# Patient Record
Sex: Male | Born: 1970
Health system: Southern US, Community
[De-identification: ages and names within clinical notes are randomized; demographics above are authoritative.]

## PROBLEM LIST (undated history)

## (undated) DIAGNOSIS — E119 Type 2 diabetes mellitus without complications: Secondary | ICD-10-CM

---

## 2012-11-05 ENCOUNTER — Encounter (HOSPITAL_COMMUNITY): Payer: Self-pay | Admitting: *Deleted

## 2012-11-05 ENCOUNTER — Inpatient Hospital Stay (HOSPITAL_COMMUNITY)
Admission: EM | Admit: 2012-11-05 | Discharge: 2012-11-07 | DRG: 639 | Disposition: A | Discharge status: Home / Self Care | Payer: MEDICAID | Attending: Internal Medicine | Admitting: Internal Medicine

## 2012-11-05 DIAGNOSIS — E119 Type 2 diabetes mellitus without complications: Secondary | ICD-10-CM

## 2012-11-05 DIAGNOSIS — Z79899 Other long term (current) drug therapy: Secondary | ICD-10-CM

## 2012-11-05 DIAGNOSIS — E781 Pure hyperglyceridemia: Secondary | ICD-10-CM | POA: Diagnosis present

## 2012-11-05 DIAGNOSIS — Z87891 Personal history of nicotine dependence: Secondary | ICD-10-CM

## 2012-11-05 DIAGNOSIS — E876 Hypokalemia: Secondary | ICD-10-CM | POA: Diagnosis present

## 2012-11-05 DIAGNOSIS — E1010 Type 1 diabetes mellitus with ketoacidosis without coma: Principal | ICD-10-CM | POA: Diagnosis present

## 2012-11-05 DIAGNOSIS — E1110 Type 2 diabetes mellitus with ketoacidosis without coma: Secondary | ICD-10-CM

## 2012-11-05 LAB — URINE MICROSCOPIC-ADD ON

## 2012-11-05 LAB — URINALYSIS, ROUTINE W REFLEX MICROSCOPIC
Bilirubin Urine: NEGATIVE
Glucose, UA: >1000 mg/dL — AB
Hgb urine dipstick: NEGATIVE
Ketones, ur: 40 mg/dL — AB
Leukocytes, UA: NEGATIVE
Nitrite: NEGATIVE
Protein, ur: NEGATIVE mg/dL
Specific Gravity, Urine: 1.031 — ABNORMAL HIGH (ref 1.005–1.030)
Urobilinogen, UA: 0.2 mg/dL (ref 0.0–1.0)
pH: 5.5 (ref 5.0–8.0)

## 2012-11-05 LAB — GLUCOSE, CAPILLARY: Glucose-Capillary: 564 mg/dL (ref 70–99)

## 2012-11-05 MED ORDER — SODIUM CHLORIDE 0.9 % IV SOLN
INTRAVENOUS | Status: DC
  Administered 2012-11-06: via INTRAVENOUS

## 2012-11-05 MED ORDER — SODIUM CHLORIDE 0.9 % IV BOLUS (SEPSIS)
1000.0000 mL | Freq: Once | INTRAVENOUS | Status: AC
  Administered 2012-11-05: 1000 mL via INTRAVENOUS

## 2012-11-05 NOTE — ED Notes (Signed)
Also blurred vision and finger sore that isn't healing

## 2012-11-05 NOTE — ED Notes (Signed)
Pt states he hasn't been diagnosed with diabetes but it runs in his family and that today he was feeling really bad and his Aunt checked his sugar and it stated greater than 600,  He has had dry mouth frequent urination,  Very tired,

## 2012-11-05 NOTE — ED Notes (Signed)
CBG: 564

## 2012-11-06 ENCOUNTER — Encounter (HOSPITAL_COMMUNITY): Payer: Self-pay | Admitting: Internal Medicine

## 2012-11-06 DIAGNOSIS — E119 Type 2 diabetes mellitus without complications: Secondary | ICD-10-CM

## 2012-11-06 DIAGNOSIS — E1110 Type 2 diabetes mellitus with ketoacidosis without coma: Secondary | ICD-10-CM | POA: Diagnosis present

## 2012-11-06 DIAGNOSIS — E876 Hypokalemia: Secondary | ICD-10-CM | POA: Diagnosis present

## 2012-11-06 LAB — BASIC METABOLIC PANEL
BUN: 10 mg/dL (ref 6–23)
BUN: 8 mg/dL (ref 6–23)
BUN: 8 mg/dL (ref 6–23)
BUN: 7 mg/dL (ref 6–23)
CO2: 16 mEq/L — ABNORMAL LOW (ref 19–32)
CO2: 16 mEq/L — ABNORMAL LOW (ref 19–32)
CO2: 16 mEq/L — ABNORMAL LOW (ref 19–32)
CO2: 18 mEq/L — ABNORMAL LOW (ref 19–32)
Calcium: 8 mg/dL — ABNORMAL LOW (ref 8.4–10.5)
Calcium: 7.8 mg/dL — ABNORMAL LOW (ref 8.4–10.5)
Calcium: 7.6 mg/dL — ABNORMAL LOW (ref 8.4–10.5)
Calcium: 7.7 mg/dL — ABNORMAL LOW (ref 8.4–10.5)
Chloride: 103 mEq/L (ref 96–112)
Chloride: 103 mEq/L (ref 96–112)
Chloride: 107 mEq/L (ref 96–112)
Chloride: 109 mEq/L (ref 96–112)
Creatinine, Ser: 0.84 mg/dL (ref 0.50–1.35)
Creatinine, Ser: 0.75 mg/dL (ref 0.50–1.35)
Creatinine, Ser: 0.65 mg/dL (ref 0.50–1.35)
Creatinine, Ser: 0.61 mg/dL (ref 0.50–1.35)
GFR calc Af Amer: >90 mL/min (ref >90)
GFR calc Af Amer: >90 mL/min (ref >90)
GFR calc Af Amer: >90 mL/min (ref >90)
GFR calc Af Amer: >90 mL/min (ref >90)
GFR calc non Af Amer: >90 mL/min (ref >90)
GFR calc non Af Amer: >90 mL/min (ref >90)
GFR calc non Af Amer: >90 mL/min (ref >90)
GFR calc non Af Amer: >90 mL/min (ref >90)
Glucose, Bld: 247 mg/dL — ABNORMAL HIGH (ref 70–99)
Glucose, Bld: 227 mg/dL — ABNORMAL HIGH (ref 70–99)
Glucose, Bld: 168 mg/dL — ABNORMAL HIGH (ref 70–99)
Glucose, Bld: 138 mg/dL — ABNORMAL HIGH (ref 70–99)
Potassium: 3.5 mEq/L (ref 3.5–5.1)
Potassium: 3.3 mEq/L — ABNORMAL LOW (ref 3.5–5.1)
Potassium: 3.6 mEq/L (ref 3.5–5.1)
Potassium: 3.3 mEq/L — ABNORMAL LOW (ref 3.5–5.1)
Sodium: 136 mEq/L (ref 135–145)
Sodium: 133 mEq/L — ABNORMAL LOW (ref 135–145)
Sodium: 136 mEq/L (ref 135–145)
Sodium: 138 mEq/L (ref 135–145)

## 2012-11-06 LAB — GLUCOSE, CAPILLARY
Glucose-Capillary: 132 mg/dL — ABNORMAL HIGH (ref 70–99)
Glucose-Capillary: 136 mg/dL — ABNORMAL HIGH (ref 70–99)
Glucose-Capillary: 139 mg/dL — ABNORMAL HIGH (ref 70–99)
Glucose-Capillary: 151 mg/dL — ABNORMAL HIGH (ref 70–99)
Glucose-Capillary: 159 mg/dL — ABNORMAL HIGH (ref 70–99)
Glucose-Capillary: 163 mg/dL — ABNORMAL HIGH (ref 70–99)
Glucose-Capillary: 166 mg/dL — ABNORMAL HIGH (ref 70–99)
Glucose-Capillary: 168 mg/dL — ABNORMAL HIGH (ref 70–99)
Glucose-Capillary: 169 mg/dL — ABNORMAL HIGH (ref 70–99)
Glucose-Capillary: 184 mg/dL — ABNORMAL HIGH (ref 70–99)
Glucose-Capillary: 197 mg/dL — ABNORMAL HIGH (ref 70–99)
Glucose-Capillary: 202 mg/dL — ABNORMAL HIGH (ref 70–99)
Glucose-Capillary: 231 mg/dL — ABNORMAL HIGH (ref 70–99)
Glucose-Capillary: 264 mg/dL — ABNORMAL HIGH (ref 70–99)
Glucose-Capillary: 319 mg/dL — ABNORMAL HIGH (ref 70–99)
Glucose-Capillary: 368 mg/dL — ABNORMAL HIGH (ref 70–99)
Glucose-Capillary: 381 mg/dL — ABNORMAL HIGH (ref 70–99)

## 2012-11-06 LAB — MRSA PCR SCREENING: MRSA by PCR: NEGATIVE

## 2012-11-06 LAB — CBC
HCT: 39 % (ref 39.0–52.0)
HCT: 36.3 % — ABNORMAL LOW (ref 39.0–52.0)
Hemoglobin: 13.4 g/dL (ref 13.0–17.0)
Hemoglobin: 12.7 g/dL — ABNORMAL LOW (ref 13.0–17.0)
MCH: 29.6 pg (ref 26.0–34.0)
MCH: 30 pg (ref 26.0–34.0)
MCHC: 34.4 g/dL (ref 30.0–36.0)
MCHC: 35 g/dL (ref 30.0–36.0)
MCV: 86.1 fL (ref 78.0–100.0)
MCV: 85.8 fL (ref 78.0–100.0)
Platelets: 184 10*3/uL (ref 150–400)
Platelets: 190 10*3/uL (ref 150–400)
RBC: 4.53 MIL/uL (ref 4.22–5.81)
RBC: 4.23 MIL/uL (ref 4.22–5.81)
RDW: 13.8 % (ref 11.5–15.5)
RDW: 13.7 % (ref 11.5–15.5)
WBC: 6.2 10*3/uL (ref 4.0–10.5)
WBC: 6.8 10*3/uL (ref 4.0–10.5)

## 2012-11-06 LAB — CBC WITH DIFFERENTIAL/PLATELET
Basophils Absolute: 0 10*3/uL (ref 0.0–0.1)
Basophils Relative: 0 % (ref 0–1)
Eosinophils Absolute: 0 10*3/uL (ref 0.0–0.7)
Eosinophils Relative: 0 % (ref 0–5)
HCT: 42.7 % (ref 39.0–52.0)
Hemoglobin: 15.3 g/dL (ref 13.0–17.0)
Lymphocytes Relative: 35 % (ref 12–46)
Lymphs Abs: 2.4 10*3/uL (ref 0.7–4.0)
MCH: 30.7 pg (ref 26.0–34.0)
MCHC: 35.8 g/dL (ref 30.0–36.0)
MCV: 85.7 fL (ref 78.0–100.0)
Monocytes Absolute: 0.6 10*3/uL (ref 0.1–1.0)
Monocytes Relative: 9 % (ref 3–12)
Neutro Abs: 3.9 10*3/uL (ref 1.7–7.7)
Neutrophils Relative %: 56 % (ref 43–77)
Platelets: 214 10*3/uL (ref 150–400)
RBC: 4.98 MIL/uL (ref 4.22–5.81)
RDW: 13.8 % (ref 11.5–15.5)
WBC: 7 10*3/uL (ref 4.0–10.5)

## 2012-11-06 LAB — TSH: TSH: 1.941 u[IU]/mL (ref 0.350–4.500)

## 2012-11-06 LAB — COMPREHENSIVE METABOLIC PANEL
ALT: 21 U/L (ref 0–53)
AST: 19 U/L (ref 0–37)
Albumin: 3.8 g/dL (ref 3.5–5.2)
Alkaline Phosphatase: 99 U/L (ref 39–117)
BUN: 11 mg/dL (ref 6–23)
CO2: 19 mEq/L (ref 19–32)
Calcium: 8.8 mg/dL (ref 8.4–10.5)
Chloride: 93 mEq/L — ABNORMAL LOW (ref 96–112)
Creatinine, Ser: 0.81 mg/dL (ref 0.50–1.35)
GFR calc Af Amer: >90 mL/min (ref >90)
GFR calc non Af Amer: >90 mL/min (ref >90)
Glucose, Bld: 573 mg/dL (ref 70–99)
Potassium: 4.4 mEq/L (ref 3.5–5.1)
Sodium: 131 mEq/L — ABNORMAL LOW (ref 135–145)
Total Bilirubin: 0.3 mg/dL (ref 0.3–1.2)
Total Protein: 7.9 g/dL (ref 6.0–8.3)

## 2012-11-06 LAB — CREATININE, SERUM
Creatinine, Ser: 0.82 mg/dL (ref 0.50–1.35)
GFR calc Af Amer: >90 mL/min (ref >90)
GFR calc non Af Amer: >90 mL/min (ref >90)

## 2012-11-06 LAB — HEMOGLOBIN A1C
Hgb A1c MFr Bld: 13.8 % — ABNORMAL HIGH (ref <5.7)
Mean Plasma Glucose: 349 mg/dL — ABNORMAL HIGH (ref <117)

## 2012-11-06 LAB — TROPONIN I: Troponin I: <0.3 ng/mL (ref <0.30)

## 2012-11-06 LAB — LIPID PANEL
Cholesterol: 149 mg/dL (ref 0–200)
HDL: 25 mg/dL — ABNORMAL LOW (ref >39)
LDL Cholesterol: 57 mg/dL (ref 0–99)
Total CHOL/HDL Ratio: 6 RATIO
Triglycerides: 333 mg/dL — ABNORMAL HIGH (ref <150)
VLDL: 67 mg/dL — ABNORMAL HIGH (ref 0–40)

## 2012-11-06 MED ORDER — INSULIN ASPART 100 UNIT/ML ~~LOC~~ SOLN
0.0000 [IU] | Freq: Three times a day (TID) | SUBCUTANEOUS | Status: DC
  Administered 2012-11-06: 3 [IU] via SUBCUTANEOUS
  Administered 2012-11-07 (×2): 8 [IU] via SUBCUTANEOUS

## 2012-11-06 MED ORDER — POTASSIUM CHLORIDE CRYS ER 20 MEQ PO TBCR
40.0000 meq | EXTENDED_RELEASE_TABLET | Freq: Two times a day (BID) | ORAL | Status: AC
  Administered 2012-11-06 (×2): 40 meq via ORAL
  Filled 2012-11-06 (×2): qty 2

## 2012-11-06 MED ORDER — INSULIN ASPART 100 UNIT/ML ~~LOC~~ SOLN
3.0000 [IU] | Freq: Three times a day (TID) | SUBCUTANEOUS | Status: DC
  Administered 2012-11-06: 3 [IU] via SUBCUTANEOUS

## 2012-11-06 MED ORDER — DEXTROSE 50 % IV SOLN: 25.0000 mL | INTRAVENOUS | Status: DC | PRN

## 2012-11-06 MED ORDER — SODIUM CHLORIDE 0.9 % IV SOLN: INTRAVENOUS | Status: DC

## 2012-11-06 MED ORDER — SODIUM CHLORIDE 0.9 % IV SOLN
INTRAVENOUS | Status: DC
  Administered 2012-11-06 – 2012-11-07 (×2): via INTRAVENOUS

## 2012-11-06 MED ORDER — INSULIN REGULAR BOLUS VIA INFUSION
0.0000 [IU] | Freq: Three times a day (TID) | INTRAVENOUS | Status: DC
  Filled 2012-11-06: qty 10

## 2012-11-06 MED ORDER — POTASSIUM CHLORIDE 10 MEQ/100ML IV SOLN
10.0000 meq | INTRAVENOUS | Status: AC
  Administered 2012-11-06 (×4): 10 meq via INTRAVENOUS
  Filled 2012-11-06 (×4): qty 100

## 2012-11-06 MED ORDER — INSULIN REGULAR HUMAN 100 UNIT/ML IJ SOLN
INTRAMUSCULAR | Status: DC
  Administered 2012-11-06: 3.1 [IU]/h via INTRAVENOUS
  Filled 2012-11-06: qty 1

## 2012-11-06 MED ORDER — ONDANSETRON HCL 4 MG/2ML IJ SOLN: 4.0000 mg | Freq: Three times a day (TID) | INTRAMUSCULAR | Status: AC | PRN

## 2012-11-06 MED ORDER — DEXTROSE-NACL 5-0.45 % IV SOLN
INTRAVENOUS | Status: DC
  Administered 2012-11-06: 03:00:00 via INTRAVENOUS

## 2012-11-06 MED ORDER — DEXTROSE-NACL 5-0.45 % IV SOLN: INTRAVENOUS | Status: DC

## 2012-11-06 MED ORDER — INSULIN GLARGINE 100 UNIT/ML ~~LOC~~ SOLN
10.0000 [IU] | Freq: Once | SUBCUTANEOUS | Status: AC
  Administered 2012-11-06: 10 [IU] via SUBCUTANEOUS

## 2012-11-06 MED ORDER — INSULIN ASPART 100 UNIT/ML ~~LOC~~ SOLN
0.0000 [IU] | Freq: Every day | SUBCUTANEOUS | Status: DC
  Administered 2012-11-06: 3 [IU] via SUBCUTANEOUS

## 2012-11-06 MED ORDER — SODIUM CHLORIDE 0.9 % IV SOLN
INTRAVENOUS | Status: DC
  Administered 2012-11-06: 6.6 [IU]/h via INTRAVENOUS
  Administered 2012-11-06: 3 [IU]/h via INTRAVENOUS
  Administered 2012-11-06: 6.3 [IU]/h via INTRAVENOUS
  Filled 2012-11-06: qty 1

## 2012-11-06 MED ORDER — INSULIN GLARGINE 100 UNIT/ML ~~LOC~~ SOLN
10.0000 [IU] | Freq: Every day | SUBCUTANEOUS | Status: DC
  Administered 2012-11-06: 10 [IU] via SUBCUTANEOUS

## 2012-11-06 MED ORDER — SODIUM CHLORIDE 0.9 % IV BOLUS (SEPSIS)
1000.0000 mL | Freq: Once | INTRAVENOUS | Status: AC
  Administered 2012-11-06: 1000 mL via INTRAVENOUS

## 2012-11-06 MED ORDER — ENOXAPARIN SODIUM 40 MG/0.4ML ~~LOC~~ SOLN
40.0000 mg | SUBCUTANEOUS | Status: DC
  Administered 2012-11-06 – 2012-11-07 (×2): 40 mg via SUBCUTANEOUS
  Filled 2012-11-06 (×2): qty 0.4

## 2012-11-06 MED ORDER — LIVING WELL WITH DIABETES BOOK
Freq: Once | Status: AC
  Administered 2012-11-06: 12:00:00
  Filled 2012-11-06: qty 1

## 2012-11-06 NOTE — ED Provider Notes (Signed)
Medical screening examination/treatment/procedure(s) were performed by non-physician practitioner and as supervising physician I was immediately available for consultation/collaboration.  Sola Margolis, MD 11/06/12 0335 

## 2012-11-06 NOTE — Progress Notes (Signed)
[id number]/LOVFIE Earlene Plater, RN, BSN, CCM:  CHART REVIEWED AND UPDATED.  Next chart review due on [id number]. NO DISCHARGE NEEDS PRESENT AT THIS TIME. CASE MANAGEMENT [phone]

## 2012-11-06 NOTE — H&P (Signed)
[facility] History and Physical  Hilton Fortner ZOX:[id number] DOB: 24-Feb-1971 DOA: 11/05/2012  Referring physician: Ms Edman Circle.PA PCP: No primary provider on file.  Specialists: None.  Chief Complaint: Increased blood sugar.  HPI: Dakota Kim is a 42 y.o. male with no significant past medical history has been experiencing increasing thirst and urination over the last 2 weeks with weakness and fatigue. Yesterday he had checked his blood sugar with help of his friends glucometer and it was found to be more than 600 and at this point he came to the ER. Initial labs reveal hyperglycemia with anion gap of 19 with urine showing ketones patient has been admitted for DKA with new onset diabetes mellitus. Denies any chest pain or shortness of breath dizziness loss of consciousness. Patient has been started IV fluids and IV insulin.  Review of Systems: The patient denies anorexia, fever, weight loss,, vision loss, decreased hearing, hoarseness, chest pain, syncope, dyspnea on exertion, peripheral edema, balance deficits, hemoptysis, abdominal pain, melena, hematochezia, severe indigestion/heartburn, hematuria, incontinence, genital sores, muscle weakness, suspicious skin lesions, transient blindness, difficulty walking, depression, unusual weight change, abnormal bleeding, enlarged lymph nodes, angioedema, and breast masses. Has fatty polyuria and polydipsia.  History reviewed. No pertinent past medical history. History reviewed. No pertinent past surgical history. Social History:  reports that he quit smoking about 4 weeks ago. He does not have any smokeless tobacco history on file. He reports that  drinks alcohol. He reports that he does not use illicit drugs. Lives at home. where does patient live--home, ALF, SNF? and with whom if at home? Can do ADLs. Can patient participate in ADLs?  No Known Allergies  Family History  Problem Relation Age of Onset  . Diabetes Mellitus II Father      Prior to Admission medications   Medication Sig Start Date End Date Taking? Authorizing Provider  cholecalciferol (VITAMIN D) 1000 UNITS tablet Take 1,000 Units by mouth daily.   Yes Historical Provider, MD  vitamin B-12 (CYANOCOBALAMIN) 1000 MCG tablet Take 1,000 mcg by mouth daily.   Yes Historical Provider, MD   Physical Exam: Filed Vitals:   11/05/12 2238  BP: 142/84  Pulse: 104  Temp: 98.7 F (37.1 C)  TempSrc: Oral  SpO2: 100%     General:  Well-built and nourished.  Eyes: Anicteric no pallor.  ENT: No discharge from ears eyes nose or mouth.  Neck: No mass felt.  Cardiovascular: S1-S2 heard.  Respiratory: No rhonchi no rales.  Abdomen: Soft nontender bowel sounds present.  Skin: No rash.  Musculoskeletal: Nontender.  Psychiatric: Appears normal.  Neurologic: Moves all extremities.  Labs on Admission:  Basic Metabolic Panel:  Recent Labs Lab 11/05/12 2350  NA 131*  K 4.4  CL 93*  CO2 19  GLUCOSE 573*  BUN 11  CREATININE 0.81  CALCIUM 8.8   Liver Function Tests:  Recent Labs Lab 11/05/12 2350  AST 19  ALT 21  ALKPHOS 99  BILITOT 0.3  PROT 7.9  ALBUMIN 3.8   No results found for this basename: LIPASE, AMYLASE,  in the last 168 hours No results found for this basename: AMMONIA,  in the last 168 hours CBC:  Recent Labs Lab 11/05/12 2350  WBC 7.0  NEUTROABS 3.9  HGB 15.3  HCT 42.7  MCV 85.7  PLT 214   Cardiac Enzymes: No results found for this basename: CKTOTAL, CKMB, CKMBINDEX, TROPONINI,  in the last 168 hours  BNP (last 3 results) No results found for this basename: PROBNP,  in the last 8760 hours CBG:  Recent Labs Lab 11/05/12 2248 11/06/12 0036  GLUCAP 564* 381*    Radiological Exams on Admission: No results found.   Assessment/Plan Principal Problem:   DKA (diabetic ketoacidoses)   1. Diabetic ketoacidosis with new onset diabetes mellitus - patient has been started on IV insulin per DKA protocol with  fluids. Change to long-acting insulin once anion gap is corrected. Check hemoglobin A1c and lipid panel. 2. History of cigarette smoking quit last month.   Code Status:  Full code. Family Communication:  None at the bedside.  Disposition Plan:  Admit to inpatient.   Juli Odom N. [facility] Pager [phone].  If 7PM-7AM, please contact night-coverage [email or web address] Password [email or web address] 11/06/2012, 1:18 AM

## 2012-11-06 NOTE — ED Provider Notes (Signed)
History     CSN: [id number]  Arrival date & time 11/05/12  2203   First MD Initiated Contact with Patient 11/05/12 2343      Chief Complaint  Patient presents with  . Hyperglycemia    (Consider location/radiation/quality/duration/timing/severity/associated sxs/prior treatment) HPI Comments: Dakota Kim is a 42 y.o. male with  no significant past medical history that presents emergency department complaining of hyperglycemia. states that recently he's been having increased urination, nocturia and dry mouth.  He denies a history of diabetes but states he does have family members with the diagnosis.  Patient checked his blood glucose earlier today on his aunts glucose meter which read greater than 600.  Patient currently does not have a primary care physician but states he did get evaluated last year for blood work with no acute abnormalities.  Patient denies any recent illness, fevers, night sweats, chills, chest pain, shortness of breath, leg swelling, paresthesias, vision changes, abdominal pain, N/V/D, decreased sensation.  No other complaints this time.    The history is provided by the patient.    History reviewed. No pertinent past medical history.  History reviewed. No pertinent past surgical history.  History reviewed. No pertinent family history.  History  Substance Use Topics  . Smoking status: Former Smoker    Quit date: 10/08/2012  . Smokeless tobacco: Not on file  . Alcohol Use: Yes     Comment: occassionally      Review of Systems  All other systems reviewed and are negative.    Allergies  Review of patient's allergies indicates no known allergies.  Home Medications   Current Outpatient Rx  Name  Route  Sig  Dispense  Refill  . cholecalciferol (VITAMIN D) 1000 UNITS tablet   Oral   Take 1,000 Units by mouth daily.         . vitamin B-12 (CYANOCOBALAMIN) 1000 MCG tablet   Oral   Take 1,000 mcg by mouth daily.           BP 142/84  Pulse 104   Temp(Src) 98.7 F (37.1 C) (Oral)  SpO2 100%  Physical Exam  Constitutional: He is oriented to person, place, and time. He appears well-developed and well-nourished. No distress.  HENT:  Head: Normocephalic and atraumatic.  Mouth/Throat: Oropharynx is clear and moist. No oropharyngeal exudate.  Eyes: Conjunctivae and EOM are normal. Pupils are equal, round, and reactive to light. No scleral icterus.  Neck: Normal range of motion. Neck supple. No tracheal deviation present. No thyromegaly present.  Cardiovascular: Normal rate, regular rhythm, normal heart sounds and intact distal pulses.   Pulmonary/Chest: Effort normal and breath sounds normal. No stridor. No respiratory distress. He has no wheezes.  Abdominal: Soft.  Musculoskeletal: Normal range of motion. He exhibits no edema and no tenderness.  Neurological: He is alert and oriented to person, place, and time. Coordination normal.  Skin: Skin is warm and dry. No rash noted. He is not diaphoretic. No erythema. No pallor.  Psychiatric: He has a normal mood and affect. His behavior is normal.    ED Course  Procedures (including critical care time)  Labs Reviewed  URINALYSIS, ROUTINE W REFLEX MICROSCOPIC - Abnormal; Notable for the following:    Specific Gravity, Urine 1.031 (*)    Glucose, UA >1000 (*)    Ketones, ur 40 (*)    All other components within normal limits  GLUCOSE, CAPILLARY - Abnormal; Notable for the following:    Glucose-Capillary 564 (*)    All other  components within normal limits  URINE MICROSCOPIC-ADD ON  CBC WITH DIFFERENTIAL  COMPREHENSIVE METABOLIC PANEL   No results found.   No diagnosis found.  AG = 19, IVFs started @ bolus's ordered with infusion pending admission  MDM  Hyperglycemia, New onset DM no PCP  Patient a 42 year old male who presents emergency department with hyperglycemia and no history of diabetes diagnosis.  Labs reviewed while in the emergency department with glucose of 573 and  anion gap of 19.  IV fluids started.  Patient to be admitted for observation for diabetes education, continued IV fluids and sugar control. The patient appears reasonably stabilized for admission considering the current resources, flow, and capabilities available in the ED at this time, and I doubt any other [facility] requiring further screening and/or treatment in the ED prior to admission.          Jaci Carrel, [place] 11/06/12 0045

## 2012-11-06 NOTE — Progress Notes (Signed)
Inpatient Diabetes Program Recommendations  AACE/ADA: New Consensus Statement on Inpatient Glycemic Control (2013)  Target Ranges:  Prepandial:   less than 140 mg/dL      Peak postprandial:   less than 180 mg/dL (1-2 hours)      Critically ill patients:  140 - 180 mg/dL   Reason for Visit: Consult for Newly-diagnosed DM    42 y.o. male with no significant past medical history who was admitted to the hospital on 11/05/2012 with new onset diabetes and diabetic ketoacidosis. On GlucoStabilizer and will probably transition to SQ later today.  Ate well at breakfast.  States he has been drinking lg quantities of juice, sweet tea and soda during last 2 weeks.  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  Briefly discussed diagnosis of diabetes, importance of controlling blood sugars, diet, exercise and monitoring.  Pt to view diabetes videos on pt ed channel and will f/u later today for further questions.  Results for Dakota Kim, Dakota Kim (MRN [id number]) as of 11/06/2012 11:50  Ref. Range 11/06/2012 08:10  Sodium Latest Range: 135-145 mEq/L 138  Potassium Latest Range: 3.5-5.1 mEq/L 3.3 (L)  Chloride Latest Range: 96-112 mEq/L 109  CO2 Latest Range: 19-32 mEq/L 18 (L)  BUN Latest Range: 6-23 mg/dL 7  Creatinine Latest Range: 0.50-1.35 mg/dL 4.09  Calcium Latest Range: 8.4-10.5 mg/dL 7.7 (L)  GFR calc non Af Amer Latest Range: >90 mL/min >90  GFR calc Af Amer Latest Range: >90 mL/min >90  Glucose Latest Range: 70-99 mg/dL 811 (H)   Results for Dakota Kim, Dakota Kim (MRN [id number]) as of 11/06/2012 11:50  Ref. Range 11/06/2012 04:11 11/06/2012 05:15 11/06/2012 06:06 11/06/2012 06:53 11/06/2012 08:00  Glucose-Capillary Latest Range: 70-99 mg/dL 213 (H) 086 (H) 578 (H) 159 (H) 139 (H)   Please give basal insulin 1- 2 hours prior to discontinuation of insulin drip.  Recommend: Lantus 10 units QD. Novolog moderate tidwc and hs Will probably need meal coverage insulin - begin with Novolog 3 units tidwc if  pt eats >50% meal Continue with inpatient diabetes education - book, videos, care notes OP Diabetes Education consult for newly diagnosed DM Will need assistance with obtaining PCP to manage DM Discussed purchase of meter and strips with pt  Will f/u in afternoon.  Thank you. Ailene Ards, RD, LDN, CDE Inpatient Diabetes Coordinator [phone]

## 2012-11-06 NOTE — Progress Notes (Signed)
[facility] PROGRESS NOTE  Dakota Kim ZOX:[id number] DOB: [birth year] DOA: 11/05/2012 PCP: No primary provider on file.  Brief narrative: Dakota Kim is an 42 y.o. male with no significant past medical history who was admitted to the hospital on 11/05/2012 with new onset diabetes and diabetic ketoacidosis.  Assessment/Plan: Principal Problem:   DKA (diabetic ketoacidoses) -the patient was admitted to the step down unit and placed on IV insulin per Glucomander protocol. -TSH, lipid panel, and hemoglobin A1c all pending. -Diabetes coordinator consultation requested. -Continue carbohydrate modified diet.   Hypokalemia -Potassium was replaced.  Code Status: Full. Family Communication: None at bedside. Disposition Plan: Home when stable.   Medical Consultants:  None.  Other Consultants:  Diabetes coordinator  Anti-infectives:  None.  HPI/Subjective: Dakota Kim is feeling a bit better.  No N/V over night.  Less thirst/polyuria.  Objective: Filed Vitals:   11/05/12 2238 11/06/12 0210 11/06/12 0400  BP: 142/84 131/82 110/58  Pulse: 104 81 94  Temp: 98.7 F (37.1 C) 98.3 F (36.8 C) 98.4 F (36.9 C)  TempSrc: Oral Oral Oral  Resp:  13 15  Height:  5\' 6"  (1.676 m)   Weight:  84.3 kg (185 lb 13.6 oz)   SpO2: 100% 100% 98%    Intake/Output Summary (Last 24 hours) at 11/06/12 0721 Last data filed at 11/06/12 8119  Gross per 24 hour  Intake 874.11 ml  Output    800 ml  Net  74.11 ml    Exam: Gen:  NAD Cardiovascular:  RRR, No M/R/G Respiratory:  Lungs CTAB Gastrointestinal:  Abdomen soft, NT/ND, + BS Extremities:  No C/E/C  Data Reviewed: Basic Metabolic Panel:  Recent Labs Lab 11/05/12 2350 11/06/12 0248 11/06/12 0440 11/06/12 0630  NA 131* 136 133* 136  K 4.4 3.5 3.3* 3.6  CL 93* 103 103 107  CO2 19 16* 16* 16*  GLUCOSE 573* 247* 227* 168*  BUN 11 10 8 8   CREATININE 0.81 0.84  0.82 0.75 0.65  CALCIUM 8.8 8.0* 7.8* 7.6*    GFR Estimated Creatinine Clearance: 122.5 ml/min (by C-G formula based on Cr of 0.65). Liver Function Tests:  Recent Labs Lab 11/05/12 2350  AST 19  ALT 21  ALKPHOS 99  BILITOT 0.3  PROT 7.9  ALBUMIN 3.8    CBC:  Recent Labs Lab 11/05/12 2350 11/06/12 0248 11/06/12 0440  WBC 7.0 6.2 6.8  NEUTROABS 3.9  --   --   HGB 15.3 13.4 12.7*  HCT 42.7 39.0 36.3*  MCV 85.7 86.1 85.8  PLT 214 184 190   Cardiac Enzymes:  Recent Labs Lab 11/06/12 0248  TROPONINI <0.30   CBG:  Recent Labs Lab 11/06/12 0311 11/06/12 0411 11/06/12 0515 11/06/12 0606 11/06/12 0653  GLUCAP 202* 231* 197* 163* 159*   Hgb A1c No results found for this basename: HGBA1C,  in the last 72 hours Lipid Profile No results found for this basename: CHOL, HDL, LDLCALC, TRIG, CHOLHDL, LDLDIRECT,  in the last 72 hours Thyroid function studies No results found for this basename: TSH, T4TOTAL, FREET3, T3FREE, THYROIDAB,  in the last 72 hours  Microbiology Recent Results (from the past 240 hour(s))  MRSA PCR SCREENING     Status: None   Collection Time    11/06/12  2:14 AM      Result Value Range Status   MRSA by PCR NEGATIVE  NEGATIVE Final   Comment:            The GeneXpert MRSA Assay (FDA  approved for NASAL specimens     only), is one component of a     comprehensive MRSA colonization     surveillance program. It is not     intended to diagnose MRSA     infection nor to guide or     monitor treatment for     MRSA infections.     Procedures and Diagnostic Studies: No results found.  Scheduled Meds: . enoxaparin (LOVENOX) injection  40 mg Subcutaneous Q24H   Continuous Infusions: . sodium chloride Stopped (11/06/12 0310)  . dextrose 5 % and 0.45% NaCl 125 mL/hr at 11/06/12 0310  . insulin (NOVOLIN-R) infusion 4 Units/hr (11/06/12 0656)    Time spent: 35 minutes.   LOS: 1 day   RAMA,CHRISTINA  [facility] Pager [phone].  If 8PM-8AM, please contact  night-coverage at [email or web address], password [facility] 11/06/2012, 7:21 AM

## 2012-11-07 LAB — MAGNESIUM: Magnesium: 1.7 mg/dL (ref 1.5–2.5)

## 2012-11-07 LAB — GLUCOSE, CAPILLARY
Glucose-Capillary: 263 mg/dL — ABNORMAL HIGH (ref 70–99)
Glucose-Capillary: 275 mg/dL — ABNORMAL HIGH (ref 70–99)

## 2012-11-07 LAB — BASIC METABOLIC PANEL
BUN: 4 mg/dL — ABNORMAL LOW (ref 6–23)
CO2: 19 mEq/L (ref 19–32)
Calcium: 8.2 mg/dL — ABNORMAL LOW (ref 8.4–10.5)
Chloride: 102 mEq/L (ref 96–112)
Creatinine, Ser: 0.61 mg/dL (ref 0.50–1.35)
GFR calc Af Amer: >90 mL/min (ref >90)
GFR calc non Af Amer: >90 mL/min (ref >90)
Glucose, Bld: 226 mg/dL — ABNORMAL HIGH (ref 70–99)
Potassium: 3.3 mEq/L — ABNORMAL LOW (ref 3.5–5.1)
Sodium: 134 mEq/L — ABNORMAL LOW (ref 135–145)

## 2012-11-07 MED ORDER — BD GETTING STARTED TAKE HOME KIT: 3/10ML X 30G SYRINGES
1.0000 | Freq: Once | Status: AC
  Administered 2012-11-07: 1
  Filled 2012-11-07: qty 1

## 2012-11-07 MED ORDER — INSULIN PEN STARTER KIT
1.0000 | Freq: Once | Status: DC
  Filled 2012-11-07: qty 1

## 2012-11-07 MED ORDER — INSULIN GLARGINE 100 UNIT/ML ~~LOC~~ SOLN: 20.0000 [IU] | Freq: Every day | SUBCUTANEOUS | Status: DC

## 2012-11-07 MED ORDER — BD GETTING STARTED TAKE HOME KIT: 1/2ML X 30G SYRINGES
1.0000 | Freq: Once | Status: DC
  Filled 2012-11-07: qty 1

## 2012-11-07 MED ORDER — POTASSIUM CHLORIDE CRYS ER 20 MEQ PO TBCR
40.0000 meq | EXTENDED_RELEASE_TABLET | Freq: Once | ORAL | Status: AC
  Administered 2012-11-07: 40 meq via ORAL
  Filled 2012-11-07: qty 2

## 2012-11-07 MED ORDER — GEMFIBROZIL 600 MG PO TABS: 600.0000 mg | ORAL_TABLET | Freq: Two times a day (BID) | ORAL | Status: DC

## 2012-11-07 MED ORDER — INSULIN ASPART 100 UNIT/ML ~~LOC~~ SOLN
4.0000 [IU] | Freq: Three times a day (TID) | SUBCUTANEOUS | Status: DC
  Administered 2012-11-07 (×2): 4 [IU] via SUBCUTANEOUS

## 2012-11-07 MED ORDER — INSULIN NPH ISOPHANE & REGULAR (70-30) 100 UNIT/ML ~~LOC~~ SUSP: 15.0000 [IU] | Freq: Two times a day (BID) | SUBCUTANEOUS | Status: DC

## 2012-11-07 NOTE — Progress Notes (Signed)
Inpatient Diabetes Program Recommendations  AACE/ADA: New Consensus Statement on Inpatient Glycemic Control (2013)  Target Ranges:  Prepandial:   less than 140 mg/dL      Peak postprandial:   less than 180 mg/dL (1-2 hours)      Critically ill patients:  140 - 180 mg/dL   Reason for Visit: Hyperglycemia and Newly diagnosed DM  Results for KYLLE, LALL (MRN [id number]) as of 11/07/2012 09:06  Ref. Range 11/06/2012 17:03 11/06/2012 21:17 11/07/2012 07:48  Glucose-Capillary Latest Range: 70-99 mg/dL 409 (H) 811 (H) 914 (H)   Blood sugars elevated.  Pt received 20 units of Lantus yesterday.  FBS high this morning and post-prandial blood sugar elevated.  Will likely benefit from increase in meal coverage insulin.  Inpatient Diabetes Program Recommendations Insulin - Basal: Please consider increasing Lantus to 12 units bid Insulin - Meal Coverage: Increase Novolog to 6 units tidwc for meal coverage insulin Outpatient Referral: Will order OP Diabetes Education for newly diagnosed DM  Note: Will continue with diabetes education.  I will order insulin starter kit and RN to begin teaching insulin administration. Will need to make certain pt has PCP to f/u with at discharge.  Pt does not have insurance at this point and he is applying for Medicaid.  May need to switch to 70/30 for home if pt has no insurance.  Will follow. Thank you. Ailene Ards, RD, LDN, CDE Inpatient Diabetes Coordinator [phone]

## 2012-11-07 NOTE — Progress Notes (Signed)
Pt has watched the diabetes videos 501-508 tonight, instructed patient to finish watching 509 & 510 today.  He verbalized understanding of the videos he has watched thus far and instructed pt to ask questions as he goes with the videos.  Fatima Sanger A

## 2012-11-07 NOTE — Progress Notes (Signed)
Pt enrolled in the [facility] program. Pt understands that this is only one time in one year from todays date.

## 2012-11-07 NOTE — Discharge Summary (Signed)
Physician Discharge Summary  Dakota Kim UJW:[id number] DOB: [birth year] DOA: 11/05/2012  PCP: No primary provider on file. being referred to the urgent care Center indigent clinic for further treatment.  Admit date: 11/05/2012 Discharge date: 11/07/2012  Recommendations for Outpatient Follow-up:  1. Recommend close outpatient followup of glycemic control and titration of 70/30 insulin up to achieve euglycemia. 2. Recommend outpatient followup of outstanding testing including insulin levels, C-peptide levels, anti-insulin antibodies, and glutamic acid decarboxylase autoantibodies.  Discharge Diagnoses:  Principal Problem:    DKA (diabetic ketoacidoses) Active Problems:    Hypokalemia    Hypertriglyceridemia   Discharge Condition: Improved.  Diet recommendation: Carbohydrate modified.  History of present illness:  Dakota Kim is an 42 y.o. male with no significant past medical history who was admitted to the hospital on 11/05/2012 with new onset diabetes and diabetic ketoacidosis.  Hospital Course by problem:  Principal Problem:  DKA (diabetic ketoacidoses)  -the patient was admitted to the step down unit and placed on IV insulin per Glucomander protocol.  -TSH within normal limits, lipid panel showed hypertriglyceridemia, and hemoglobin A1c 13.8.  -Diabetes coordinator saw the patient and provided education.  -Continue carbohydrate modified diet.  -Will set up outpatient diabetes education. Hypokalemia  -Potassium was replaced. (Given an additional 40 mEq prior to discharge). -Magnesium was checked and it was normal at 1.7. Hypertriglyceridemia -Lopid started.  Procedures:  None.  Consultations:  Diabetes coordinator  Discharge Exam: Filed Vitals:   11/07/12 0501  BP: 109/74  Pulse: 77  Temp: 98.6 F (37 C)  Resp: 20   Filed Vitals:   11/06/12 1608 11/06/12 1806 11/06/12 2115 11/07/12 0501  BP: 120/85 117/72 124/85 109/74  Pulse: 74 75 85 77  Temp:  98.6 F (37 C) 98.8 F (37.1 C) 98.8 F (37.1 C) 98.6 F (37 C)  TempSrc: Oral Oral Oral Oral  Resp: 16 18 20 20   Height:      Weight:      SpO2: 100% 99% 100% 100%    Gen:  NAD Cardiovascular:  RRR, No M/R/G Respiratory: Lungs CTAB Gastrointestinal: Abdomen soft, NT/ND with normal active bowel sounds. Extremities: No C/E/C   Discharge Instructions      Discharge Orders   Future Orders Complete By Expires     Activity as tolerated - No restrictions  As directed     Ambulatory referral to Nutrition and Diabetic Education  As directed     Comments:      Newly diagnosed DM    Call MD for:  As directed     Scheduling Instructions:      Excessive thirst, excessive urination, or persistently elevated blood glucoses greater than 300.    Diet Carb Modified  As directed     Discharge instructions  As directed     Comments:      Please check your blood glucoses before breakfast and supper and keep a log of your blood glucose readings to bring with you at your next doctor's appointment.        Medication List    TAKE these medications       cholecalciferol 1000 UNITS tablet  Commonly known as:  VITAMIN D  Take 1,000 Units by mouth daily.     insulin NPH-insulin regular (70-30) 100 UNIT/ML injection  Commonly known as:  NOVOLIN 70/30  Inject 15 Units into the skin 2 (two) times daily with a meal.     vitamin B-12 1000 MCG tablet  Commonly known as:  CYANOCOBALAMIN  Take 1,000 mcg by mouth daily.       Follow-up Information   Follow up with Urgent Care Center. (11/12/12 at 3 pm.)        The results of significant diagnostics from this hospitalization (including imaging, microbiology, ancillary and laboratory) are listed below for reference.    Significant Diagnostic Studies: No results found.  Microbiology: Recent Results (from the past 240 hour(s))  MRSA PCR SCREENING     Status: None   Collection Time    11/06/12  2:14 AM      Result Value Range Status    MRSA by PCR NEGATIVE  NEGATIVE Final   Comment:            The GeneXpert MRSA Assay (FDA     approved for NASAL specimens     only), is one component of a     comprehensive MRSA colonization     surveillance program. It is not     intended to diagnose MRSA     infection nor to guide or     monitor treatment for     MRSA infections.     Labs:  Basic Metabolic Panel:  Recent Labs Lab 11/06/12 0248 11/06/12 0440 11/06/12 0630 11/06/12 0810 11/07/12 0419  NA 136 133* 136 138 134*  K 3.5 3.3* 3.6 3.3* 3.3*  CL 103 103 107 109 102  CO2 16* 16* 16* 18* 19  GLUCOSE 247* 227* 168* 138* 226*  BUN 10 8 8 7  4*  CREATININE 0.84  0.82 0.75 0.65 0.61 0.61  CALCIUM 8.0* 7.8* 7.6* 7.7* 8.2*  MG  --   --   --   --  1.7   GFR Estimated Creatinine Clearance: 122.5 ml/min (by C-G formula based on Cr of 0.61). Liver Function Tests:  Recent Labs Lab 11/05/12 2350  AST 19  ALT 21  ALKPHOS 99  BILITOT 0.3  PROT 7.9  ALBUMIN 3.8   CBC:  Recent Labs Lab 11/05/12 2350 11/06/12 0248 11/06/12 0440  WBC 7.0 6.2 6.8  NEUTROABS 3.9  --   --   HGB 15.3 13.4 12.7*  HCT 42.7 39.0 36.3*  MCV 85.7 86.1 85.8  PLT 214 184 190   Cardiac Enzymes:  Recent Labs Lab 11/06/12 0248  TROPONINI <0.30   CBG:  Recent Labs Lab 11/06/12 1437 11/06/12 1548 11/06/12 1703 11/06/12 2117 11/07/12 0748  GLUCAP 132* 136* 168* 264* 263*   Hgb A1c  Recent Labs  11/06/12 0810  HGBA1C 13.8*   Lipid Profile  Recent Labs  11/06/12 0440  CHOL 149  HDL 25*  LDLCALC 57  TRIG 478*  CHOLHDL 6.0   Thyroid function studies  Recent Labs  11/06/12 0440  TSH 1.941   Microbiology Recent Results (from the past 240 hour(s))  MRSA PCR SCREENING     Status: None   Collection Time    11/06/12  2:14 AM      Result Value Range Status   MRSA by PCR NEGATIVE  NEGATIVE Final   Comment:            The GeneXpert MRSA Assay (FDA     approved for NASAL specimens     only), is one component  of a     comprehensive MRSA colonization     surveillance program. It is not     intended to diagnose MRSA     infection nor to guide or     monitor treatment for     MRSA  infections.    Time coordinating discharge: 35 minutes.  Signed:  Kailyn Vanderslice  Pager [phone] [facility] 11/07/2012, 11:36 AM

## 2012-11-07 NOTE — Progress Notes (Signed)
Patient discharged per MD order. Discharge instructions and 2 prescriptions (1 for insulin) given to the patient. Teaching regarding how to checking CBGs and how to administer insulin injections done thoroughly with the patient. Patient demonstrated correctly how to check his blood sugar and also how to administer injections. This RN also assisted patient in making an outpatient appointment after discharge at the [facility] for diabetes followup since he currently has no insurance. This RN also made a log for patient to record CBGs 2x daily. Pt refused wheelchair and walked downstairs where friend was waiting to take him home. Angelena Form, RN

## 2012-11-12 ENCOUNTER — Encounter (HOSPITAL_COMMUNITY): Payer: Self-pay

## 2012-11-12 ENCOUNTER — Emergency Department (INDEPENDENT_AMBULATORY_CARE_PROVIDER_SITE_OTHER)
Admission: EM | Admit: 2012-11-12 | Discharge: 2012-11-12 | Disposition: A | Discharge status: Home / Self Care | Payer: Self-pay | Source: Home / Self Care

## 2012-11-12 DIAGNOSIS — Z794 Long term (current) use of insulin: Secondary | ICD-10-CM

## 2012-11-12 DIAGNOSIS — IMO0001 Reserved for inherently not codable concepts without codable children: Secondary | ICD-10-CM

## 2012-11-12 DIAGNOSIS — E119 Type 2 diabetes mellitus without complications: Secondary | ICD-10-CM

## 2012-11-12 LAB — GLUCOSE, CAPILLARY: Glucose-Capillary: 298 mg/dL — ABNORMAL HIGH (ref 70–99)

## 2012-11-12 MED ORDER — INSULIN NPH ISOPHANE & REGULAR (70-30) 100 UNIT/ML ~~LOC~~ SUSP: 25.0000 [IU] | Freq: Two times a day (BID) | SUBCUTANEOUS | Status: DC

## 2012-11-12 NOTE — ED Notes (Signed)
Blood sugar in office 298

## 2012-11-12 NOTE — ED Notes (Signed)
Patient states has been recently diagnosed withDM Is currently taking insulin\ B/s this am 217

## 2012-11-12 NOTE — ED Provider Notes (Signed)
History     CSN: [id number]  Arrival date & time 11/12/12  1452  42 y.o. male with no significant past medical history who was admitted to the hospital on 11/05/2012 with new onset diabetes and diabetic ketoacidosis. Patient has been checking his fasting sugar which has been ranging between 200-15. Postprandial or bedtime the patient's sugars have been around 299. The patient has been instructed to take his insulin 10 minutes after breakfast and 10 minutes after dinner. He denies any chest pain any shortness of breath any numbness or tingling in his feet he does complain of blurry vision and is requesting for an ophthalmology referral    Chief Complaint  Patient presents with  . Diabetes    (Consider location/radiation/quality/duration/timing/severity/associated sxs/prior treatment) HPI  History reviewed. No pertinent past medical history.  History reviewed. No pertinent past surgical history.  Family History  Problem Relation Age of Onset  . Diabetes Mellitus II Father     History  Substance Use Topics  . Smoking status: Former Smoker    Quit date: 10/08/2012  . Smokeless tobacco: Not on file  . Alcohol Use: Yes     Comment: occassionally      Review of Systems All 14 point review of systems was done with pertinent positives as above  Allergies  Review of patient's allergies indicates no known allergies.  Home Medications   Current Outpatient Rx  Name  Route  Sig  Dispense  Refill  . cholecalciferol (VITAMIN D) 1000 UNITS tablet   Oral   Take 1,000 Units by mouth daily.         Marland Kitchen gemfibrozil (LOPID) 600 MG tablet   Oral   Take 1 tablet (600 mg total) by mouth 2 (two) times daily before a meal.   60 tablet   3   . insulin NPH-insulin regular (NOVOLIN 70/30) (70-30) 100 UNIT/ML injection   Subcutaneous   Inject 25 Units into the skin 2 (two) times daily with a meal.   10 mL   12     Please also dispense: Insulin syringes # 60/month, ...   . vitamin  B-12 (CYANOCOBALAMIN) 1000 MCG tablet   Oral   Take 1,000 mcg by mouth daily.           BP 107/79  Pulse 79  Temp(Src) 98.7 F (37.1 C) (Oral)  SpO2 100%  Physical Exam Gen: NAD  Cardiovascular: RRR, No M/R/G  Respiratory: Lungs CTAB  Gastrointestinal: Abdomen soft, NT/ND with normal active bowel sounds.  Extremities: No C/E/C  ED Course  Procedures (including critical care time)  Labs Reviewed  GLUCOSE, CAPILLARY - Abnormal; Notable for the following:    Glucose-Capillary 298 (*)    All other components within normal limits   No results found.   No diagnosis found.    MDM  Insulin-dependent diabetes-the patient's NPH is being increased to 25 units twice a day, he will need a hemoglobin A1c check in a couple of months He has been instructed to continue checking his sugars He has been instructed to return to the ER or urgent care if he develops any chest pain shortness of breath An ophthalmology referral has been provided       Richarda Overlie, MD 11/12/12 1526

## 2017-04-02 ENCOUNTER — Ambulatory Visit (HOSPITAL_COMMUNITY)
Admission: EM | Admit: 2017-04-02 | Discharge: 2017-04-02 | Disposition: A | Discharge status: Home / Self Care | Payer: BLUE CROSS/BLUE SHIELD | Attending: Internal Medicine | Admitting: Internal Medicine

## 2017-04-02 ENCOUNTER — Encounter (HOSPITAL_COMMUNITY): Payer: Self-pay | Admitting: Emergency Medicine

## 2017-04-02 DIAGNOSIS — Z760 Encounter for issue of repeat prescription: Secondary | ICD-10-CM

## 2017-04-02 DIAGNOSIS — E119 Type 2 diabetes mellitus without complications: Secondary | ICD-10-CM

## 2017-04-02 HISTORY — DX: Type 2 diabetes mellitus without complications: E11.9

## 2017-04-02 MED ORDER — INSULIN ISOPHANE & REGULAR (HUMAN 70-30)100 UNIT/ML KWIKPEN: 36.0000 [IU] | PEN_INJECTOR | Freq: Two times a day (BID) | SUBCUTANEOUS | 0 refills | Status: DC

## 2017-04-02 NOTE — ED Provider Notes (Signed)
CSN: [id number]     Arrival date & time 04/02/17  1607 History   None    Chief Complaint  Patient presents with  . Medication Refill   (Consider location/radiation/quality/duration/timing/severity/associated sxs/prior Treatment) 46 year old male with history of type 2 diabetes comes in for medication refill. Patient states he was at a shelter and term, and that was where he was getting his medications. Recently moved back to [place], and has not been able to establish with a PCP. Patient brings in his insulin pen with him. Has not had any symptoms, last dose of medication was today. Denies nausea, vomiting, abdominal pain, weakness. Denies chest pain, palpitations, shortness of breath. He has not checked his blood sugars today, but usually runs around 200s.      Past Medical History:  Diagnosis Date  . Diabetes mellitus without complication (HCC)    No past surgical history on file. Family History  Problem Relation Age of Onset  . Diabetes Mellitus II Father    Social History  Substance Use Topics  . Smoking status: Former Smoker    Quit date: 10/08/2012  . Smokeless tobacco: Not on file  . Alcohol use Yes     Comment: occassionally    Review of Systems  Reason unable to perform ROS: See HPI as above.    Allergies  Patient has no known allergies.  Home Medications   Prior to Admission medications   Medication Sig Start Date End Date Taking? Authorizing Provider  Insulin NPH Human, Isophane, (HUMULIN N KWIKPEN Christine) Inject into the skin.   Yes [provider]  Insulin Isophane & Regular Human (HUMULIN 70/30 MIX) (70-30) 100 UNIT/ML PEN Inject 36 Units into the skin 2 (two) times daily. 04/02/17 04/09/17  Belinda FisherYu, Maurina Fawaz V, PA-C   Meds Ordered and Administered this Visit  Medications - No data to display  BP 127/75 (BP Location: Left Arm)   Pulse 69   Temp 98.3 F (36.8 C) (Oral)   Resp 18   SpO2 98%  No data found.   Physical Exam  Constitutional: He is  oriented to person, place, and time. He appears well-developed and well-nourished. No distress.  HENT:  Head: Normocephalic and atraumatic.  Eyes: Pupils are equal, round, and reactive to light. Conjunctivae are normal.  Cardiovascular: Normal rate, regular rhythm and normal heart sounds.  Exam reveals no gallop and no friction rub.   No murmur heard. Pulmonary/Chest: Breath sounds normal. He has no wheezes. He has no rales.  Neurological: He is alert and oriented to person, place, and time.  Skin: Skin is warm and dry.  Psychiatric: He has a normal mood and affect. His behavior is normal. Judgment normal.    Urgent Care Course     Procedures (including critical care time)  Labs Review Labs Reviewed - No data to display  Imaging Review No results found.      MDM   1. Medication refill    Will refill 1 week of medication. Discussed with patient will have to establish with PCP for further refills, given no record of recent blood work. Patient expresses understanding and agrees to plan. Resources for PCP given to patient. Patient to monitor for abdominal pain, nausea, vomiting, weakness, to go to the ED for further evaluation.    Belinda FisherYu, Izzah Pasqua V, PA-C 04/02/17 1830

## 2017-04-02 NOTE — Discharge Instructions (Signed)
I have given you one week of refills. You will need to establish with PCP for further refills. Monitor for nausea, vomiting, abdominal pain, weakness, to follow up at the ED for evaluation.

## 2017-04-02 NOTE — ED Triage Notes (Signed)
Patient is requesting refill for humulin kwik pen.  Patient was living in a shelter in [place] and the local provider prescribed this medicine.

## 2017-04-03 ENCOUNTER — Encounter (HOSPITAL_COMMUNITY): Payer: Self-pay | Admitting: Vascular Surgery

## 2017-04-03 ENCOUNTER — Emergency Department (HOSPITAL_COMMUNITY)
Admission: EM | Admit: 2017-04-03 | Discharge: 2017-04-03 | Disposition: A | Discharge status: Home / Self Care | Payer: BLUE CROSS/BLUE SHIELD | Attending: Emergency Medicine | Admitting: Emergency Medicine

## 2017-04-03 DIAGNOSIS — Z87891 Personal history of nicotine dependence: Secondary | ICD-10-CM | POA: Insufficient documentation

## 2017-04-03 DIAGNOSIS — Z794 Long term (current) use of insulin: Secondary | ICD-10-CM | POA: Diagnosis not present

## 2017-04-03 DIAGNOSIS — E1165 Type 2 diabetes mellitus with hyperglycemia: Secondary | ICD-10-CM | POA: Diagnosis present

## 2017-04-03 DIAGNOSIS — R739 Hyperglycemia, unspecified: Secondary | ICD-10-CM

## 2017-04-03 LAB — URINALYSIS, ROUTINE W REFLEX MICROSCOPIC
Bacteria, UA: NONE SEEN
Bilirubin Urine: NEGATIVE
Glucose, UA: >=500 mg/dL — AB
Hgb urine dipstick: NEGATIVE
Ketones, ur: NEGATIVE mg/dL
Leukocytes, UA: NEGATIVE
Nitrite: NEGATIVE
Protein, ur: NEGATIVE mg/dL
Specific Gravity, Urine: 1.03 (ref 1.005–1.030)
Squamous Epithelial / LPF: NONE SEEN
WBC, UA: NONE SEEN WBC/hpf (ref 0–5)
pH: 5 (ref 5.0–8.0)

## 2017-04-03 LAB — CBC
HCT: 46.3 % (ref 39.0–52.0)
Hemoglobin: 16 g/dL (ref 13.0–17.0)
MCH: 29.8 pg (ref 26.0–34.0)
MCHC: 34.6 g/dL (ref 30.0–36.0)
MCV: 86.2 fL (ref 78.0–100.0)
Platelets: 231 10*3/uL (ref 150–400)
RBC: 5.37 MIL/uL (ref 4.22–5.81)
RDW: 13.4 % (ref 11.5–15.5)
WBC: 6.6 10*3/uL (ref 4.0–10.5)

## 2017-04-03 LAB — BASIC METABOLIC PANEL
Anion gap: 10 (ref 5–15)
BUN: 9 mg/dL (ref 6–20)
CO2: 23 mmol/L (ref 22–32)
Calcium: 9.5 mg/dL (ref 8.9–10.3)
Chloride: 101 mmol/L (ref 101–111)
Creatinine, Ser: 0.96 mg/dL (ref 0.61–1.24)
GFR calc Af Amer: >60 mL/min (ref >60)
GFR calc non Af Amer: >60 mL/min (ref >60)
Glucose, Bld: 576 mg/dL (ref 65–99)
Potassium: 4.8 mmol/L (ref 3.5–5.1)
Sodium: 134 mmol/L — ABNORMAL LOW (ref 135–145)

## 2017-04-03 LAB — CBG MONITORING, ED
Glucose-Capillary: 578 mg/dL (ref 65–99)
Glucose-Capillary: 448 mg/dL — ABNORMAL HIGH (ref 65–99)
Glucose-Capillary: 325 mg/dL — ABNORMAL HIGH (ref 65–99)

## 2017-04-03 MED ORDER — INSULIN ISOPHANE & REGULAR (HUMAN 70-30)100 UNIT/ML KWIKPEN: 36.0000 [IU] | PEN_INJECTOR | Freq: Two times a day (BID) | SUBCUTANEOUS | 0 refills | Status: DC

## 2017-04-03 MED ORDER — INSULIN ASPART 100 UNIT/ML ~~LOC~~ SOLN
5.0000 [IU] | Freq: Once | SUBCUTANEOUS | Status: AC
  Administered 2017-04-03: 5 [IU] via INTRAVENOUS
  Filled 2017-04-03: qty 1

## 2017-04-03 MED ORDER — SODIUM CHLORIDE 0.9 % IV BOLUS (SEPSIS)
2000.0000 mL | Freq: Once | INTRAVENOUS | Status: AC
  Administered 2017-04-03: 2000 mL via INTRAVENOUS

## 2017-04-03 MED ORDER — INSULIN ISOPHANE HUMAN 100 UNIT/ML KWIKPEN: 36.0000 [IU] | PEN_INJECTOR | Freq: Two times a day (BID) | SUBCUTANEOUS | 11 refills | Status: DC

## 2017-04-03 NOTE — Discharge Instructions (Signed)
Please follow with your primary care doctor in the next 2 days for a check-up. They must obtain records for further management.  ° °Do not hesitate to return to the Emergency Department for any new, worsening or concerning symptoms.  ° °

## 2017-04-03 NOTE — ED Notes (Signed)
Pt requesting directions to the cafeteria, pt advised of glucose reading & encouraged to remain NPO, pt adamantly going to cafeteria

## 2017-04-03 NOTE — ED Provider Notes (Signed)
[facility]-EMERGENCY DEPT Provider Note   CSN: [id number] Arrival date & time: 04/03/17  1556     History   Chief Complaint Chief Complaint  Patient presents with  . Medication Refill  . Fatigue    HPI Dakota Kim is a 46 y.o. male with past medical history significant for insulin-dependent diabetes, he states recently new to the area, he ran out of his Humalog 7030 insulin yesterday. He was seen at urgent care and written a prescription however his new insurance will not cover it. He was offered an alternate but he declined this because he states that he is unsure if he would have a reaction to it. He feels that he had a allergic reaction to Novolin in the past. Patient is in his normal state of health up until yesterday when he developed polyuria, polydipsia with no nausea, vomiting, chest pain, abdominal pain. He is requesting refill of his insulin.  HPI  Past Medical History:  Diagnosis Date  . Diabetes mellitus without complication [facility](HCC)     Patient Active Problem List   Diagnosis Date Noted  . DKA (diabetic ketoacidoses) (HCC) 11/06/2012  . Hypokalemia 11/06/2012    History reviewed. No pertinent surgical history.     Home Medications    Prior to Admission medications   Medication Sig Start Date End Date Taking? Authorizing Provider  Insulin Isophane & Regular Human (HUMULIN 70/30 MIX) (70-30) 100 UNIT/ML PEN Inject 36 Units into the skin 2 (two) times daily. 04/03/17 04/10/17  Brennin Durfee, Joni ReiningNicole, PA-C  Insulin NPH, Human,, Isophane, (HUMULIN N KWIKPEN) 100 UNIT/ML Kiwkpen Inject 36 Units into the skin 2 (two) times daily. 04/03/17   Love Milbourne, Joni ReiningNicole, PA-C    Family History Family History  Problem Relation Age of Onset  . Diabetes Mellitus II Father     Social History Social History  Substance Use Topics  . Smoking status: Former Smoker    Quit date: 10/08/2012  . Smokeless tobacco: Never Used  . Alcohol use Yes     Comment: occassionally     Allergies     Patient has no known allergies.   Review of Systems Review of Systems  A complete review of systems was obtained and all systems are negative except as noted in the HPI and PMH.    Physical Exam Updated Vital Signs BP 124/72 (BP Location: Right Arm)   Pulse 85   Temp 98.2 F (36.8 C) (Oral)   Resp 18   SpO2 97%   Physical Exam  Constitutional: He is oriented to person, place, and time. He appears well-developed and well-nourished. No distress.  HENT:  Head: Normocephalic and atraumatic.  Mouth/Throat: Oropharynx is clear and moist.  Eyes: Pupils are equal, round, and reactive to light. Conjunctivae and EOM are normal.  Neck: Normal range of motion.  Cardiovascular: Normal rate, regular rhythm and intact distal pulses.   Pulmonary/Chest: Effort normal and breath sounds normal.  Abdominal: Soft. There is no tenderness.  Musculoskeletal: Normal range of motion.  Neurological: He is alert and oriented to person, place, and time.  Skin: He is not diaphoretic.  Psychiatric: He has a normal mood and affect.  Nursing note and vitals reviewed.    ED Treatments / Results  Labs (all labs ordered are listed, but only abnormal results are displayed) Labs Reviewed  BASIC METABOLIC PANEL - Abnormal; Notable for the following:       Result Value   Sodium 134 (*)    Glucose, Bld 576 (*)  All other components within normal limits  URINALYSIS, ROUTINE W REFLEX MICROSCOPIC - Abnormal; Notable for the following:    Color, Urine COLORLESS (*)    Glucose, UA >=500 (*)    All other components within normal limits  CBG MONITORING, ED - Abnormal; Notable for the following:    Glucose-Capillary 578 (*)    All other components within normal limits  CBG MONITORING, ED - Abnormal; Notable for the following:    Glucose-Capillary 448 (*)    All other components within normal limits  CBG MONITORING, ED - Abnormal; Notable for the following:    Glucose-Capillary 325 (*)    All other  components within normal limits  CBC    EKG  EKG Interpretation None       Radiology No results found.  Procedures Procedures (including critical care time)  Medications Ordered in ED Medications  sodium chloride 0.9 % bolus 2,000 mL (0 mLs Intravenous Stopped 04/03/17 2152)  insulin aspart (novoLOG) injection 5 Units (5 Units Intravenous Given 04/03/17 2028)     Initial Impression / Assessment and Plan / ED Course  I have reviewed the triage vital signs and the nursing notes.  Pertinent labs & imaging results that were available during my care of the patient were reviewed by me and considered in my medical decision making (see chart for details).    Vitals:   04/03/17 1617 04/03/17 2149  BP: 126/85 124/72  Pulse: 82 85  Resp: 16 18  Temp: 98.2 F (36.8 C)   TempSrc: Oral   SpO2: 96% 97%    Medications  sodium chloride 0.9 % bolus 2,000 mL (0 mLs Intravenous Stopped 04/03/17 2152)  insulin aspart (novoLOG) injection 5 Units (5 Units Intravenous Given 04/03/17 2028)    Dakota Kim is 46 y.o. male presenting with Hyperglycemia, normal anion gap, no ketones in his urine. She's been without insulin for 24 hours. He reports polyuria and polydipsia. Abdominal exam is benign, vital signs reassuring. Case management is consulted, Dakota Kim has arranged primary care on 731. She has also stated that he [facility], will give prescription for his regular Humulin dosage. Patient advised to fill prescription first thing in the morning, he verbalized understanding and teach back technique.  Evaluation does not show pathology that would require ongoing emergent intervention or inpatient treatment. Pt is hemodynamically stable and mentating appropriately. Discussed findings and plan with patient/guardian, who agrees with care plan. All questions answered. Return precautions discussed and outpatient follow up given.    Final Clinical Impressions(s) /  ED Diagnoses   Final diagnoses:  Hyperglycemia without ketosis    New Prescriptions Discharge Medication List as of 04/03/2017 10:03 PM       Dakota Kim, Mardella Layman 04/03/17 2319    Cardama, Amadeo Garnet, MD 04/04/17 [phone]

## 2017-04-03 NOTE — ED Triage Notes (Signed)
Pt reports to the ED for eval of medication refill. States he went through a period where he did not have insurance. He states that he ran out of his kwikpen Humalog 70/30. [facility] and [facility] was given [facility] rx for the medication and states that they pharmacy state that his insurance does not cover it. He also reports fatigue today as well as some polyuria.

## 2017-04-04 MED FILL — HUMULIN 70/30 KWIKPEN: (70-30) 100 | 21 days supply | Qty: 15 | Fill #0

## 2017-04-10 ENCOUNTER — Encounter: Payer: Self-pay | Admitting: Family Medicine

## 2017-04-10 ENCOUNTER — Ambulatory Visit (INDEPENDENT_AMBULATORY_CARE_PROVIDER_SITE_OTHER): Payer: BLUE CROSS/BLUE SHIELD | Admitting: Family Medicine

## 2017-04-10 VITALS — BP 140/72 | HR 60 | Temp 98.1°F | Resp 16 | Ht 66.0 in | Wt 199.2 lb

## 2017-04-10 DIAGNOSIS — E119 Type 2 diabetes mellitus without complications: Secondary | ICD-10-CM

## 2017-04-10 DIAGNOSIS — Z794 Long term (current) use of insulin: Secondary | ICD-10-CM | POA: Diagnosis not present

## 2017-04-10 DIAGNOSIS — Z6832 Body mass index (BMI) 32.0-32.9, adult: Secondary | ICD-10-CM | POA: Diagnosis not present

## 2017-04-10 LAB — GLUCOSE, CAPILLARY: Glucose-Capillary: 262 mg/dL — ABNORMAL HIGH (ref 65–99)

## 2017-04-10 LAB — POCT URINALYSIS DIP (DEVICE)
Bilirubin Urine: NEGATIVE
Glucose, UA: >=1000 mg/dL — AB
Hgb urine dipstick: NEGATIVE
Ketones, ur: NEGATIVE mg/dL
Leukocytes, UA: NEGATIVE
Nitrite: NEGATIVE
Protein, ur: NEGATIVE mg/dL
Specific Gravity, Urine: 1.02 (ref 1.005–1.030)
Urobilinogen, UA: 0.2 mg/dL (ref 0.0–1.0)
pH: 5.5 (ref 5.0–8.0)

## 2017-04-10 LAB — CBC WITH DIFFERENTIAL/PLATELET
Basophils Absolute: 0 cells/uL (ref 0–200)
Basophils Relative: 0 %
Eosinophils Absolute: 0 cells/uL — ABNORMAL LOW (ref 15–500)
Eosinophils Relative: 0 %
HCT: 44.8 % (ref 38.5–50.0)
Hemoglobin: 14.7 g/dL (ref 13.2–17.1)
Lymphocytes Relative: 40 %
Lymphs Abs: 2080 cells/uL (ref 850–3900)
MCH: 29.9 pg (ref 27.0–33.0)
MCHC: 32.8 g/dL (ref 32.0–36.0)
MCV: 91.2 fL (ref 80.0–100.0)
MPV: 11.7 fL (ref 7.5–12.5)
Monocytes Absolute: 312 cells/uL (ref 200–950)
Monocytes Relative: 6 %
Neutro Abs: 2808 cells/uL (ref 1500–7800)
Neutrophils Relative %: 54 %
Platelets: 240 10*3/uL (ref 140–400)
RBC: 4.91 MIL/uL (ref 4.20–5.80)
RDW: 14.5 % (ref 11.0–15.0)
WBC: 5.2 10*3/uL (ref 3.8–10.8)

## 2017-04-10 LAB — POCT GLYCOSYLATED HEMOGLOBIN (HGB A1C): Hemoglobin A1C: 12.7

## 2017-04-10 MED ORDER — BLOOD GLUCOSE MONITOR KIT: PACK | 0 refills | Status: DC

## 2017-04-10 MED ORDER — INSULIN ISOPHANE & REGULAR (HUMAN 70-30)100 UNIT/ML KWIKPEN: 45.0000 [IU] | PEN_INJECTOR | Freq: Two times a day (BID) | SUBCUTANEOUS | 0 refills | Status: DC

## 2017-04-10 NOTE — Progress Notes (Signed)
Patient ID: Dakota Kim, male    DOB: Dec 03, 1970, 46 y.o.   MRN: [id number]  PCP: Dakota NeighborsHarris, Khriz Liddy S, FNP  Chief Complaint  Patient presents with  . Hospitalization Follow-up    Subjective:  HPI Dakota Kim is a 46 y.o. male presents to establish care and hospital follow-up. Medical history significant for insulin dependent diabetes, uncontrolled diabetes, obesity, and medication non-compliance.[name] initially [facility] requesting a refill of his Novolin 70/30 on 04/03/2017. Upon arrive his CBG was 578. He was treated with fluids and insulin. DKA and HHS were both ruled out. Dakota Kim'Kim Novolin was refilled and he has picked up and resumed administration of medication. Dakota Kim was diagnosed with diabetes over 1 year ago after being hospitalized with hyperglycemia.  He reports compliance with medication however, has not had a recent diabetes follow-up as he was previously uninsured and did not have a PCP. He reports occasional monitoring of glucose at home, although he is presently out of test strips. He admits that he doesn't administer insulin as directed. Occasionally he eats, and administers insulin later or skips doses entirely. Dakota Kim does not adhere to a diabetic diet or eating regimen. Often drinks sweet tea. He doesn't engage in physical activity and current Body mass index is 32.15 kg/m. Social History   Social History  . Marital status: Single    Spouse name: N/A  . Number of children: N/A  . Years of education: N/A   Occupational History  . Not on file.   Social History Main Topics  . Smoking status: Former Smoker    Quit date: 10/08/2012  . Smokeless tobacco: Never Used  . Alcohol use Yes     Comment: occassionally  . Drug use: No  . Sexual activity: Not on file   Other Topics Concern  . Not on file   Social History Narrative  . No narrative on file    Family History  Problem Relation Age of Onset  . Diabetes  Mellitus II Father    Review of Systems See HPI Patient Active Problem List   Diagnosis Date Noted  . DKA (diabetic ketoacidoses) (HCC) 11/06/2012  . Hypokalemia 11/06/2012    No Known Allergies  Prior to Admission medications   Medication Sig Start Date End Date Taking? Authorizing Provider  Insulin Isophane & Regular Human (HUMULIN 70/30 MIX) (70-30) 100 UNIT/ML PEN Inject 36 Units into the skin 2 (two) times daily. 04/03/17 04/10/17 Yes Pisciotta, Joni ReiningNicole, PA-C  Insulin NPH, Human,, Isophane, (HUMULIN N KWIKPEN) 100 UNIT/ML Kiwkpen Inject 36 Units into the skin 2 (two) times daily. Patient not taking: Reported on 04/10/2017 04/03/17   Pisciotta, Mardella LaymanNicole, PA-C   Past Medical, Surgical Family and Social History reviewed and updated.   Objective:   Today'Kim Vitals   04/10/17 0945  BP: 140/72  Pulse: 60  Resp: 16  Temp: 98.1 F (36.7 C)  TempSrc: Oral  SpO2: 100%  Weight: 199 lb 3.2 oz (90.4 kg)  Height: 5\' 6"  (1.676 m)    Wt Readings from Last 3 Encounters:  04/10/17 199 lb 3.2 oz (90.4 kg)  11/06/12 185 lb 13.6 oz (84.3 kg)   Physical Exam  Constitutional: He is oriented to person, place, and time. He appears well-developed and well-nourished.  HENT:  Head: Normocephalic and atraumatic.  Eyes: Pupils are equal, round, and reactive to light. Conjunctivae and EOM are normal.  Neck: Normal range of motion. Neck supple.  Cardiovascular: Normal rate, regular rhythm, normal heart sounds and intact  distal pulses.   Pulmonary/Chest: Effort normal and breath sounds normal.  Musculoskeletal: Normal range of motion.  Neurological: He is alert and oriented to person, place, and time.  Skin: Skin is warm and dry.  Psychiatric: He has a normal mood and affect. His behavior is normal. Judgment and thought content normal.   Assessment & Plan:  1. Type 2 diabetes mellitus without complication, with long-term current use of insulin (HCC), uncontrolled with current A1C-12.7. -Metformin  1000 mg, 2 times daily with meals -Humalin 70/30 increased units 45 units, twice daily with meals.  2. BMI 32.0-32.9,adult -Increase physical activity, recommended 150 minutes per week  Orders placed this encounter: - POCT glycosylated hemoglobin (Hb A1C)-12.7 - EKG 12-Lead - CBC with Differential - COMPLETE METABOLIC PANEL WITH GFR - Lipid panel   RTC: 1 month diabetes follow-up.  Godfrey PickKimberly Kim. Tiburcio PeaHarris, MSN, FNP-C [facility]  [address] [place], [place] [address] [phone]

## 2017-04-10 NOTE — Patient Instructions (Addendum)
Pending review of you labs, I will contact you to add a oral diabetes medication.  Increase your insulin to 45 units before breakfast and before dinner.  Take 81 mg aspirin daily for heart health and add physical activity at least 15-20 minutes daily of walking.   Diabetes Mellitus and Food It is important for you to manage your blood sugar (glucose) level. Your blood glucose level can be greatly affected by what you eat. Eating healthier foods in the appropriate amounts throughout the day at about the same time each day will help you control your blood glucose level. It can also help slow or prevent worsening of your diabetes mellitus. Healthy eating may even help you improve the level of your blood pressure and reach or maintain a healthy weight. General recommendations for healthful eating and cooking habits include:  Eating meals and snacks regularly. Avoid going long periods of time without eating to lose weight.  Eating a diet that consists mainly of plant-based foods, such as fruits, vegetables, nuts, legumes, and whole grains.  Using low-heat cooking methods, such as baking, instead of high-heat cooking methods, such as deep frying.  Work with your dietitian to make sure you understand how to use the Nutrition Facts information on food labels. How can food affect me? Carbohydrates Carbohydrates affect your blood glucose level more than any other type of food. Your dietitian will help you determine how many carbohydrates to eat at each meal and teach you how to count carbohydrates. Counting carbohydrates is important to keep your blood glucose at a healthy level, especially if you are using insulin or taking certain medicines for diabetes mellitus. Alcohol Alcohol can cause sudden decreases in blood glucose (hypoglycemia), especially if you use insulin or take certain medicines for diabetes mellitus. Hypoglycemia can be a life-threatening condition. Symptoms of hypoglycemia (sleepiness,  dizziness, and disorientation) are similar to symptoms of having too much alcohol. If your health care provider has given you approval to drink alcohol, do so in moderation and use the following guidelines:  Women should not have more than one drink per day, and men should not have more than two drinks per day. One drink is equal to: ? 12 oz of beer. ? 5 oz of wine. ? 1 oz of hard liquor.  Do not drink on an empty stomach.  Keep yourself hydrated. Have water, diet soda, or unsweetened iced tea.  Regular soda, juice, and other mixers might contain a lot of carbohydrates and should be counted.  What foods are not recommended? As you make food choices, it is important to remember that all foods are not the same. Some foods have fewer nutrients per serving than other foods, even though they might have the same number of calories or carbohydrates. It is difficult to get your body what it needs when you eat foods with fewer nutrients. Examples of foods that you should avoid that are high in calories and carbohydrates but low in nutrients include:  Trans fats (most processed foods list trans fats on the Nutrition Facts label).  Regular soda.  Juice.  Candy.  Sweets, such as cake, pie, doughnuts, and cookies.  Fried foods.  What foods can I eat? Eat nutrient-rich foods, which will nourish your body and keep you healthy. The food you should eat also will depend on several factors, including:  The calories you need.  The medicines you take.  Your weight.  Your blood glucose level.  Your blood pressure level.  Your cholesterol level.  You should eat a variety of foods, including:  Protein. ? Lean cuts of meat. ? Proteins low in saturated fats, such as fish, egg whites, and beans. Avoid processed meats.  Fruits and vegetables. ? Fruits and vegetables that may help control blood glucose levels, such as apples, mangoes, and yams.  Dairy products. ? Choose fat-free or low-fat  dairy products, such as milk, yogurt, and cheese.  Grains, bread, pasta, and rice. ? Choose whole grain products, such as multigrain bread, whole oats, and brown rice. These foods may help control blood pressure.  Fats. ? Foods containing healthful fats, such as nuts, avocado, olive oil, canola oil, and fish.  Does everyone with diabetes mellitus have the same meal plan? Because every person with diabetes mellitus is different, there is not one meal plan that works for everyone. It is very important that you meet with a dietitian who will help you create a meal plan that is just right for you. This information is not intended to replace advice given to you by your health care provider. Make sure you discuss any questions you have with your health care provider. Document Released: 05/25/2005 Document Revised: 02/03/2016 Document Reviewed: 07/25/2013 Elsevier Interactive Patient Education  2017 Elsevier Inc.  Type 2 Diabetes Mellitus, Diagnosis, Adult Type 2 diabetes (type 2 diabetes mellitus) is a long-term (chronic) disease. In type 2 diabetes, one or both of these problems may be present:  The pancreas does not make enough of a hormone called insulin.  Cells in the body do not respond properly to insulin that the body makes (insulin resistance).  Normally, insulin allows blood sugar (glucose) to enter cells in the body. The cells use glucose for energy. Insulin resistance or lack of insulin causes excess glucose to build up in the blood instead of going into cells. As a result, high blood glucose (hyperglycemia) develops. What increases the risk? The following factors may make you more likely to develop type 2 diabetes:  Having a family member with type 2 diabetes.  Being overweight or obese.  Having an inactive (sedentary) lifestyle.  Having been diagnosed with insulin resistance.  Having a history of prediabetes, gestational diabetes, or polycystic ovarian syndrome  (PCOS).  Being of American-Indian, African-American, Hispanic/Latino, or Asian/Pacific Islander descent.  What are the signs or symptoms? In the early stage of this condition, you may not have symptoms. Symptoms develop slowly and may include:  Increased thirst (polydipsia).  Increased hunger(polyphagia).  Increased urination (polyuria).  Increased urination during the night (nocturia).  Unexplained weight loss.  Frequent infections that keep coming back (recurring).  Fatigue.  Weakness.  Vision changes, such as blurry vision.  Cuts or bruises that are slow to heal.  Tingling or numbness in the hands or feet.  Dark patches on the skin (acanthosis nigricans).  How is this diagnosed?  This condition is diagnosed based on your symptoms, your medical history, a physical exam, and your blood glucose level. Your blood glucose may be checked with one or more of the following blood tests:  A fasting blood glucose (FBG) test. You will not be allowed to eat (you will fast) for at least 8 hours before a blood sample is taken.  A random blood glucose test. This checks blood glucose at any time of day regardless of when you ate.  An A1c (hemoglobin A1c) blood test. This provides information about blood glucose control over the previous 2-3 months.  An oral glucose tolerance test (OGTT). This measures your blood glucose at  two times: ? After fasting. This is your baseline blood glucose level. ? Two hours after drinking a beverage that contains glucose.  You may be diagnosed with type 2 diabetes if:  Your FBG level is 126 mg/dL (7.0 mmol/L) or higher.  Your random blood glucose level is 200 mg/dL (16.1 mmol/L) or higher.  Your A1c level is 6.5% or higher.  Your OGGT result is higher than 200 mg/dL (09.6 mmol/L).  These blood tests may be repeated to confirm your diagnosis. How is this treated?  Your treatment may be managed by a specialist called an endocrinologist. Type 2  diabetes may be treated by following instructions from your health care provider about:  Making diet and lifestyle changes. This may include: ? Following an individualized nutrition plan that is developed by a diet and nutrition specialist (registered dietitian). ? Exercising regularly. ? Finding ways to manage stress.  Checking your blood glucose level as often as directed.  Taking diabetes medicines or insulin daily. This helps to keep your blood glucose levels in the healthy range. ? If you use insulin, you may need to adjust the dosage depending on how physically active you are and what foods you eat. Your health care provider will tell you how to adjust your dosage.  Taking medicines to help prevent complications from diabetes, such as: ? Aspirin. ? Medicine to lower cholesterol. ? Medicine to control blood pressure.  Your health care provider will set individualized treatment goals for you. Your goals will be based on your age, other medical conditions you have, and how you respond to diabetes treatment. Generally, the goal of treatment is to maintain the following blood glucose levels:  Before meals (preprandial): 80-130 mg/dL (0.4-5.4 mmol/L).  After meals (postprandial): below 180 mg/dL (10 mmol/L).  A1c level: less than 7%.  Follow these instructions at home: Questions to Ask Your Health Care Provider Consider asking the following questions:  Do I need to meet with a diabetes educator?  Where can I find a support group for people with diabetes?  What equipment will I need to manage my diabetes at home?  What diabetes medicines do I need, and when should I take them?  How often do I need to check my blood glucose?  What number can I call if I have questions?  When is my next appointment?  General instructions  Take over-the-counter and prescription medicines only as told by your health care provider.  Keep all follow-up visits as told by your health care  provider. This is important.  For more information about diabetes, visit: ? American Diabetes Association (ADA): www.diabetes.org ? American Association of Diabetes Educators (AADE): www.diabeteseducator.org/patient-resources Contact a health care provider if:  Your blood glucose is at or above 240 mg/dL (09.8 mmol/L) for 2 days in a row.  You have been sick or have had a fever for 2 days or longer and you are not getting better.  You have any of the following problems for more than 6 hours: ? You cannot eat or drink. ? You have nausea and vomiting. ? You have diarrhea. Get help right away if:  Your blood glucose is lower than 54 mg/dL (3.0 mmol/L).  You become confused or you have trouble thinking clearly.  You have difficulty breathing.  You have moderate or large ketone levels in your urine. This information is not intended to replace advice given to you by your health care provider. Make sure you discuss any questions you have with your health care  provider. Document Released: 08/28/2005 Document Revised: 02/03/2016 Document Reviewed: 10/01/2015 Elsevier Interactive Patient Education  2017 [employer].

## 2017-04-11 LAB — COMPLETE METABOLIC PANEL WITH GFR
ALT: 15 U/L (ref 9–46)
AST: 11 U/L (ref 10–40)
Albumin: 3.9 g/dL (ref 3.6–5.1)
Alkaline Phosphatase: 70 U/L (ref 40–115)
BUN: 10 mg/dL (ref 7–25)
CO2: 22 mmol/L (ref 20–31)
Calcium: 8.6 mg/dL (ref 8.6–10.3)
Chloride: 105 mmol/L (ref 98–110)
Creat: 0.84 mg/dL (ref 0.60–1.35)
GFR, Est African American: >89 mL/min (ref >=60)
GFR, Est Non African American: >89 mL/min (ref >=60)
Glucose, Bld: 240 mg/dL — ABNORMAL HIGH (ref 65–99)
Potassium: 4.3 mmol/L (ref 3.5–5.3)
Sodium: 138 mmol/L (ref 135–146)
Total Bilirubin: 0.5 mg/dL (ref 0.2–1.2)
Total Protein: 6.4 g/dL (ref 6.1–8.1)

## 2017-04-11 LAB — LIPID PANEL
Cholesterol: 172 mg/dL (ref <200)
HDL: 41 mg/dL (ref >40)
LDL Cholesterol: 110 mg/dL — ABNORMAL HIGH (ref <100)
Total CHOL/HDL Ratio: 4.2 Ratio (ref <5.0)
Triglycerides: 104 mg/dL (ref <150)
VLDL: 21 mg/dL (ref <30)

## 2017-04-11 MED ORDER — METFORMIN HCL 1000 MG PO TABS: 1000.0000 mg | ORAL_TABLET | Freq: Two times a day (BID) | ORAL | 3 refills | Status: DC

## 2017-04-11 MED FILL — metFORMIN HCL 1000 MG TABS: 1000 | 30 days supply | Qty: 60 | Fill #0

## 2017-04-27 ENCOUNTER — Other Ambulatory Visit: Payer: Self-pay | Admitting: Family Medicine

## 2017-04-27 MED ORDER — "INSULIN SYRINGE 30G X 5/16"" 1 ML MISC": 3 refills | Status: DC

## 2017-05-08 ENCOUNTER — Encounter: Payer: Self-pay | Admitting: Family Medicine

## 2017-05-08 ENCOUNTER — Ambulatory Visit (INDEPENDENT_AMBULATORY_CARE_PROVIDER_SITE_OTHER): Payer: BLUE CROSS/BLUE SHIELD | Admitting: Family Medicine

## 2017-05-08 VITALS — BP 130/83 | HR 78 | Temp 98.6°F | Resp 16 | Ht 66.0 in | Wt 193.2 lb

## 2017-05-08 DIAGNOSIS — Z794 Long term (current) use of insulin: Secondary | ICD-10-CM

## 2017-05-08 DIAGNOSIS — E119 Type 2 diabetes mellitus without complications: Secondary | ICD-10-CM | POA: Diagnosis not present

## 2017-05-08 LAB — GLUCOSE, CAPILLARY: Glucose-Capillary: 173 mg/dL — ABNORMAL HIGH (ref 65–99)

## 2017-05-08 NOTE — Progress Notes (Signed)
Patient ID: Dakota Kim, male    DOB: [birth year], 46 y.o.   MRN: [id number]  PCP: Scot Jun, FNP  Chief Complaint  Patient presents with  . Follow-up    diabetes    Subjective:  HPI Kieth Kim is a 46 y.o. male with a history of uncontrolled type 2 diabetes presents for a 1 month follow-up. He was hospitalized over 1 month ago for hyperglycemia and found to have a A1C 12.7. During his last office visit, his medication regimen was changed to add Metformin 1000 mg twice daily and insulin was increased 45 units twice daily with meals. Tradarius reports compliance with his current regimen. Reports over the last month readings consistently below 200. He is tolerating the Metformin although reports increased bowel movements. He denies hypoglycemia, urinary frequency, or neuropathic pain. He denies any other complaints today.  Social History   Social History  . Marital status: Single    Spouse name: N/A  . Number of children: N/A  . Years of education: N/A   Occupational History  . Not on file.   Social History Main Topics  . Smoking status: Former Smoker    Quit date: 10/08/2012  . Smokeless tobacco: Never Used  . Alcohol use Yes     Comment: occassionally  . Drug use: No  . Sexual activity: Not on file   Other Topics Concern  . Not on file   Social History Narrative  . No narrative on file    Family History  Problem Relation Age of Onset  . Diabetes Mellitus II Father    Review of Systems See HPI  Patient Active Problem List   Diagnosis Date Noted  . DKA (diabetic ketoacidoses) ([place]) 11/06/2012  . Hypokalemia 11/06/2012    No Known Allergies  Prior to Admission medications   Medication Sig Start Date End Date Taking? Authorizing Provider  blood glucose meter kit and supplies KIT Dispense based on patient and insurance preference. Check blood sugar up to four times daily or as needed. ICD 10 E11.9 04/10/17  Yes Scot Jun, FNP  Insulin  Isophane & Regular Human (HUMULIN 70/30 MIX) (70-30) 100 UNIT/ML PEN Inject 45 Units into the skin 2 (two) times daily. 04/10/17 07/03/17 Yes Scot Jun, FNP  Insulin NPH, Human,, Isophane, (HUMULIN N KWIKPEN) 100 UNIT/ML Kiwkpen Inject 36 Units into the skin 2 (two) times daily. 04/03/17  Yes Pisciotta, Elmyra Ricks, PA-C  Insulin Syringe-Needle U-100 (INSULIN SYRINGE 1CC/30GX5/16") 30G X 5/16" 1 ML MISC For use of administration of insulin twice daily 04/27/17  Yes Scot Jun, FNP  metFORMIN (GLUCOPHAGE) 1000 MG tablet Take 1 tablet (1,000 mg total) by mouth 2 (two) times daily with a meal. 04/11/17  Yes Scot Jun, FNP    Past Medical, Surgical Family and Social History reviewed and updated.    Objective:   Today's Vitals   05/08/17 1555  BP: 130/83  Pulse: 78  Resp: 16  Temp: 98.6 F (37 C)  TempSrc: Oral  SpO2: 99%  Weight: 193 lb 3.2 oz (87.6 kg)  Height: _0  (1.676 m)    Wt Readings from Last 3 Encounters:  05/08/17 193 lb 3.2 oz (87.6 kg)  04/10/17 199 lb 3.2 oz (90.4 kg)  11/06/12 185 lb 13.6 oz (84.3 kg)   Physical Exam  Constitutional: He is oriented to person, place, and time. He appears well-developed and well-nourished.  HENT:  Head: Normocephalic and atraumatic.  Neck: Normal range of motion. Neck supple.  Cardiovascular: Normal rate, regular rhythm, normal heart sounds and intact distal pulses.   Pulmonary/Chest: Effort normal and breath sounds normal.  Abdominal: Soft. Bowel sounds are normal.  Neurological: He is alert and oriented to person, place, and time.  Skin: Skin is warm and dry.  Psychiatric: He has a normal mood and affect. His behavior is normal. Judgment and thought content normal.    Assessment & Plan:  1. Type 2 diabetes mellitus without complication, with long-term current use of insulin (HCC) -Current CBG today 173, non fasting -Follow-up here at the office if blood sugars are consistently greater than 200.  RTC: 2 months  for diabetes follow-up   Carroll Sage. Kenton Kingfisher, MSN, FNP-C [facility]  [address] [place],  [id number] [phone]

## 2017-05-13 DIAGNOSIS — Z794 Long term (current) use of insulin: Principal | ICD-10-CM

## 2017-05-13 DIAGNOSIS — E119 Type 2 diabetes mellitus without complications: Secondary | ICD-10-CM | POA: Insufficient documentation

## 2017-05-17 ENCOUNTER — Telehealth: Payer: Self-pay | Admitting: Family Medicine

## 2017-05-17 NOTE — Telephone Encounter (Signed)
Patient was prescribed Humulin insulin and the pharmacy stated that it's not covered by insurance and now he doesn't know what to do since he's out of insulin. Patient uses the pharmacy [facility] [address], but [facility], which ever one will cover his insulin. Please advise.

## 2017-05-18 NOTE — Telephone Encounter (Signed)
Could you follow-up regarding patient insulin. If I recall correctly, a prior authorization was completed for this medication at [facility].  Godfrey PickKimberly S. Tiburcio PeaHarris, MSN, FNP-C [facility]  [address] [place], [place] [address] [phone]

## 2017-05-18 NOTE — Telephone Encounter (Signed)
[facility] will [facility]

## 2017-05-22 ENCOUNTER — Other Ambulatory Visit: Payer: Self-pay | Admitting: Family Medicine

## 2017-05-22 MED ORDER — INSULIN ISOPHANE & REGULAR (HUMAN 70-30)100 UNIT/ML KWIKPEN: 45.0000 [IU] | PEN_INJECTOR | Freq: Two times a day (BID) | SUBCUTANEOUS | 0 refills | Status: DC

## 2017-05-22 MED ORDER — INSULIN NPH ISOPHANE & REGULAR (70-30) 100 UNIT/ML ~~LOC~~ SUSP: 45.0000 [IU] | Freq: Two times a day (BID) | SUBCUTANEOUS | 5 refills | Status: DC

## 2017-05-22 NOTE — Progress Notes (Signed)
Refilled Novolin 70/30, 45 units with meals and [facility]

## 2017-06-28 ENCOUNTER — Telehealth: Payer: Self-pay

## 2017-06-28 MED ORDER — INSULIN PEN NEEDLE 30G X 8 MM MISC: 1.0000 | 5 refills | Status: DC | PRN

## 2017-06-28 MED ORDER — INSULIN ASPART PROT & ASPART (70-30 MIX) 100 UNIT/ML PEN: 45.0000 [IU] | PEN_INJECTOR | Freq: Two times a day (BID) | SUBCUTANEOUS | 11 refills | Status: DC

## 2017-06-28 MED ORDER — INSULIN ASPART PROT & ASPART (70-30 MIX) 100 UNIT/ML ~~LOC~~ SUSP: 42.0000 [IU] | Freq: Two times a day (BID) | SUBCUTANEOUS | 11 refills | Status: DC

## 2017-06-28 NOTE — Telephone Encounter (Signed)
Corrected novolog to pen form and dosage to 45 units

## 2017-06-28 NOTE — Telephone Encounter (Signed)
Pharmacy states that is just 1 box which would last patient 11 days. Please verify quantity ans send a new rx reflecting increase in quantity.

## 2017-06-28 NOTE — Telephone Encounter (Signed)
Novolog 70/30 sent to pharmacy

## 2017-06-28 NOTE — Telephone Encounter (Signed)
Insurance doesn't cover Humulin but they do cover Novolin or Novolog products. [facility]

## 2017-06-29 ENCOUNTER — Other Ambulatory Visit: Payer: Self-pay | Admitting: Family Medicine

## 2017-06-29 MED ORDER — INSULIN PEN NEEDLE 29G X 12.7MM MISC: 2 refills | Status: DC

## 2017-06-29 NOTE — Progress Notes (Signed)
Submitted BD ultra-fine per insurance requirements.

## 2017-08-06 ENCOUNTER — Ambulatory Visit: Payer: Self-pay | Attending: Internal Medicine

## 2017-08-06 MED FILL — !NOVOLOG 70/30 FLEXPEN: 70-30 | 16 days supply | Qty: 15 | Fill #0

## 2017-08-13 ENCOUNTER — Telehealth: Payer: Self-pay | Admitting: Family Medicine

## 2017-08-13 NOTE — Telephone Encounter (Signed)
Reordered Humalog 75/25 Kwikpens as [facility]   Godfrey PickKimberly S. Tiburcio PeaHarris, MSN, FNP-C [facility]  [address] [place], [place] [address] [phone]

## 2017-08-15 ENCOUNTER — Other Ambulatory Visit: Payer: Self-pay | Admitting: Family Medicine

## 2017-08-15 MED ORDER — INSULIN LISPRO PROT & LISPRO (75-25 MIX) 100 UNIT/ML KWIKPEN: 45.0000 [IU] | PEN_INJECTOR | Freq: Two times a day (BID) | SUBCUTANEOUS | 11 refills | Status: DC

## 2017-08-15 MED ORDER — INSULIN PEN NEEDLE 29G X 12.7MM MISC: 2 refills | Status: DC

## 2017-08-15 NOTE — Progress Notes (Signed)
-  ordered 70/25 Humalog 75/25-45 units twice daily with meals

## 2017-08-22 MED FILL — !HUMALOG MIX 75-25 KWIKPEN: (75-25) 100 | 16 days supply | Qty: 15 | Fill #0

## 2017-10-02 MED FILL — !HUMALOG MIX 75-25 KWIKPEN: (75-25) 100 | 32 days supply | Qty: 30 | Fill #1

## 2017-11-26 MED FILL — !HUMALOG MIX 75-25 KWIKPEN: (75-25) 100 | 33 days supply | Qty: 30 | Fill #2

## 2018-01-18 MED FILL — HUMALOG MIX 75-25 KWIKPEN: (75-25) 100 | 33 days supply | Qty: 30 | Fill #3

## 2018-03-05 MED FILL — !HUMALOG MIX 75-25 KWIKPEN: (75-25) 100 | 33 days supply | Qty: 30 | Fill #4

## 2018-03-11 ENCOUNTER — Other Ambulatory Visit: Payer: Self-pay

## 2018-03-11 ENCOUNTER — Emergency Department (HOSPITAL_COMMUNITY)
Admission: EM | Admit: 2018-03-11 | Discharge: 2018-03-11 | Disposition: A | Discharge status: Home / Self Care | Payer: Self-pay | Attending: Emergency Medicine | Admitting: Emergency Medicine

## 2018-03-11 ENCOUNTER — Encounter (HOSPITAL_COMMUNITY): Payer: Self-pay | Admitting: Obstetrics and Gynecology

## 2018-03-11 DIAGNOSIS — Z794 Long term (current) use of insulin: Secondary | ICD-10-CM | POA: Insufficient documentation

## 2018-03-11 DIAGNOSIS — E1110 Type 2 diabetes mellitus with ketoacidosis without coma: Secondary | ICD-10-CM | POA: Insufficient documentation

## 2018-03-11 DIAGNOSIS — K0889 Other specified disorders of teeth and supporting structures: Secondary | ICD-10-CM | POA: Insufficient documentation

## 2018-03-11 DIAGNOSIS — Z87891 Personal history of nicotine dependence: Secondary | ICD-10-CM | POA: Insufficient documentation

## 2018-03-11 MED ORDER — NAPROXEN 500 MG PO TABS
500.0000 mg | ORAL_TABLET | Freq: Once | ORAL | Status: AC
  Administered 2018-03-11: 500 mg via ORAL
  Filled 2018-03-11: qty 1

## 2018-03-11 MED ORDER — PENICILLIN V POTASSIUM 500 MG PO TABS: 500.0000 mg | ORAL_TABLET | Freq: Four times a day (QID) | ORAL | 0 refills | Status: AC

## 2018-03-11 MED ORDER — NAPROXEN 500 MG PO TABS: 500.0000 mg | ORAL_TABLET | Freq: Two times a day (BID) | ORAL | 0 refills | Status: DC

## 2018-03-11 NOTE — ED Provider Notes (Signed)
[facility] DEPT Provider Note   CSN: [id number] Arrival date & time: 03/11/18  2008   History   Chief Complaint Chief Complaint  Patient presents with  . Dental Pain    HPI Dakota Kim is a 47 y.o. male with a history of T2DM who presents to the emergency department with L lower dental pain over the past few days. Pain is constant, progressively worsening, 10/10 in severity. He states tooth is loose. He has had problems with it in the past. Pain worse with movement of tooth and with chewing. Has not taken meds PTA. Does not see a dentist regularly. Denies fever, chills, nausea, vomiting, dyspnea, drooling, or change in voice.   HPI  Past Medical History:  Diagnosis Date  . Diabetes mellitus without complication [facility])     Patient Active Problem List   Diagnosis Date Noted  . Type 2 diabetes mellitus without complication, with long-term current use of insulin ([facility]) 05/13/2017  . DKA (diabetic ketoacidoses) ([place]) 11/06/2012  . Hypokalemia 11/06/2012    History reviewed. No pertinent surgical history.      Home Medications    Prior to Admission medications   Medication Sig Start Date End Date Taking? Authorizing Provider  blood glucose meter kit and supplies KIT Dispense based on patient and insurance preference. Check blood sugar up to four times daily or as needed. ICD 10 E11.9 04/10/17   Scot Jun, FNP  Insulin Lispro Prot & Lispro (HUMALOG MIX 75/25 KWIKPEN) (75-25) 100 UNIT/ML Kwikpen Inject 45 Units into the skin 2 (two) times daily with breakfast and lunch. 08/15/17   Scot Jun, FNP  Insulin Pen Needle (BD ULTRA-FINE PEN NEEDLES) 29G X 12.7MM MISC ICD10 E11.9 for use with insulin administration 08/15/17   Scot Jun, FNP  metFORMIN (GLUCOPHAGE) 1000 MG tablet Take 1 tablet (1,000 mg total) by mouth 2 (two) times daily with a meal. 04/11/17   Scot Jun, FNP    Family History Family History  Problem  Relation Age of Onset  . Diabetes Mellitus II Father     Social History Social History   Tobacco Use  . Smoking status: Former Smoker    Last attempt to quit: 10/08/2012    Years since quitting: 5.4  . Smokeless tobacco: Never Used  Substance Use Topics  . Alcohol use: Yes    Comment: occassionally  . Drug use: No     Allergies   Patient has no known allergies.   Review of Systems Review of Systems  Constitutional: Negative for chills and fever.  HENT: Positive for dental problem. Negative for drooling, sore throat, trouble swallowing and voice change.   Respiratory: Negative for shortness of breath.   Gastrointestinal: Negative for nausea and vomiting.    Physical Exam Updated Vital Signs BP 127/89 (BP Location: Left Arm)   Pulse 68   Temp 98.8 F (37.1 C) (Oral)   Resp 18   SpO2 98%   Physical Exam  Constitutional: He appears well-developed and well-nourished.  Non-toxic appearance. No distress.  HENT:  Head: Normocephalic and atraumatic.  Right Ear: Tympanic membrane normal. Tympanic membrane is not perforated, not erythematous, not retracted and not bulging.  Left Ear: Tympanic membrane normal. Tympanic membrane is not perforated, not erythematous, not retracted and not bulging.  Nose: Nose normal.  Mouth/Throat: Uvula is midline and oropharynx is clear and moist. No oropharyngeal exudate or posterior oropharyngeal erythema.    Patient is tolerating his own secretions without difficulty. No  trismus. No drooling. No hot potato voice. Submandibular compartment is soft.   Eyes: Conjunctivae are normal. Right eye exhibits no discharge. Left eye exhibits no discharge.  Neck: Normal range of motion. Neck supple.  Cardiovascular: Normal rate and regular rhythm.  Pulmonary/Chest: Effort normal and breath sounds normal. No stridor. No respiratory distress. He has no wheezes.  Lymphadenopathy:    He has no cervical adenopathy.  Neurological: He is alert.    Psychiatric: He has a normal mood and affect. His behavior is normal. Thought content normal.  Nursing note and vitals reviewed.    ED Treatments / Results  Labs (all labs ordered are listed, but only abnormal results are displayed) Labs Reviewed - No data to display  EKG None  Radiology No results found.  Procedures Procedures (including critical care time)  Medications Ordered in ED Medications - No data to display   Initial Impression / Assessment and Plan / ED Course  I have reviewed the triage vital signs and the nursing notes.  Pertinent labs & imaging results that were available during my care of the patient were reviewed by me and considered in my medical decision making (see chart for details).    Patient presents with dental pain. Patient is nontoxic appearing with stable vital signs.  No gross abscess.  Exam unconcerning for Ludwig's angina or spread of infection.  Tooth is loose and likely needs extraction. Will treat with Penicillin VK and Naproxen.  Urged patient to follow-up with dentist, dental resources were provided.  Discussed treatment plan and need for follow up as well as return precautions. Provided opportunity for questions, patient confirmed understanding and is agreeable to plan.  Final Clinical Impressions(s) / ED Diagnoses   Final diagnoses:  Pain, dental    ED Discharge Orders        Ordered    naproxen (NAPROSYN) 500 MG tablet  2 times daily     03/11/18 2057    penicillin v potassium (VEETID) 500 MG tablet  4 times daily     03/11/18 2057       Elisa Kutner, Glynda Jaeger, PA-C 03/11/18 2301    Carmin Muskrat, MD 03/11/18 2318

## 2018-03-11 NOTE — ED Triage Notes (Signed)
Pt reports dental pain and swelling x3 days. Pt reports using orajel with no relief. Pt reports he does not have a dentist.

## 2018-03-11 NOTE — Discharge Instructions (Signed)
Call one of the dentists offices provided to schedule an appointment for re-evaluation and further management within the next 48 hours.   I have prescribed you Penicillin VK which is an antibiotic to treat the infection and Naproxen which is an anti-inflammatory medicine to treat the pain.   Please take all of your antibiotics until finished. You may develop abdominal discomfort or diarrhea from the antibiotic.  You may help offset this with probiotics which you can buy at the store (ask your pharmacist if unable to find) or get probiotics in the form of eating yogurt. Do not eat or take the probiotics until 2 hours after your antibiotic. If you are unable to tolerate these side effects follow-up with your primary care provider or return to the emergency department.   If you begin to experience any blistering, rashes, swelling, or difficulty breathing seek medical care for evaluation of potentially more serious side effects.   Be sure to eat something when taking the Naproxen as it can cause stomach upset and at worst stomach bleeding. Do not take additional non steroidal anti-inflammatory medicines such as Ibuprofen, Aleve, or Advil while taking Naproxen. You may supplement with Tylenol.   Please be aware that this medications may interact with other medications you are taking, please be sure to discuss your medication list with your pharmacist. If you are taking birth control the antibiotic will deactivate your birth control for 2 weeks.   If you start to experience and new or worsening symptoms return to the emergency department. If you start to experience fever, chills, neck stiffness/pain, or inability to move your neck come back to the emergency department immediately.

## 2018-05-14 MED FILL — $HUMALOG MIX 75-25 KWIKPEN: (75-25) 100 | 33 days supply | Qty: 30 | Fill #5

## 2018-07-15 MED FILL — $HUMALOG MIX 75-25 KWIKPEN: (75-25) 100 | 16 days supply | Qty: 15 | Fill #6

## 2018-08-16 ENCOUNTER — Other Ambulatory Visit: Payer: Self-pay | Admitting: Family Medicine

## 2018-08-21 ENCOUNTER — Other Ambulatory Visit: Payer: Self-pay | Admitting: Family Medicine

## 2018-08-23 ENCOUNTER — Ambulatory Visit (INDEPENDENT_AMBULATORY_CARE_PROVIDER_SITE_OTHER): Payer: Self-pay | Admitting: Family Medicine

## 2018-08-23 ENCOUNTER — Encounter: Payer: Self-pay | Admitting: Family Medicine

## 2018-08-23 VITALS — BP 134/90 | HR 88 | Temp 98.0°F | Ht 65.0 in | Wt 215.0 lb

## 2018-08-23 DIAGNOSIS — Z0000 Encounter for general adult medical examination without abnormal findings: Secondary | ICD-10-CM

## 2018-08-23 DIAGNOSIS — Z794 Long term (current) use of insulin: Secondary | ICD-10-CM

## 2018-08-23 DIAGNOSIS — Z09 Encounter for follow-up examination after completed treatment for conditions other than malignant neoplasm: Secondary | ICD-10-CM

## 2018-08-23 DIAGNOSIS — E119 Type 2 diabetes mellitus without complications: Secondary | ICD-10-CM

## 2018-08-23 LAB — POCT GLYCOSYLATED HEMOGLOBIN (HGB A1C): Hemoglobin A1C: 8.3 % — AB (ref 4.0–5.6)

## 2018-08-23 LAB — POCT URINALYSIS DIP (MANUAL ENTRY)
Bilirubin, UA: NEGATIVE
Blood, UA: NEGATIVE
Glucose, UA: 100 mg/dL — AB
Ketones, POC UA: NEGATIVE mg/dL
Leukocytes, UA: NEGATIVE
Nitrite, UA: NEGATIVE
Protein Ur, POC: NEGATIVE mg/dL
Spec Grav, UA: 1.025 (ref 1.010–1.025)
Urobilinogen, UA: 1 E.U./dL
pH, UA: 6 (ref 5.0–8.0)

## 2018-08-23 LAB — GLUCOSE, POCT (MANUAL RESULT ENTRY): POC Glucose: 191 mg/dl — AB (ref 70–99)

## 2018-08-23 NOTE — Progress Notes (Addendum)
Follow Up   Subjective:    Patient ID: Dakota Kim, male    DOB: [birth year], 47 y.o.   MRN: [id number]  Chief Complaint  Patient presents with  . Follow-up    chronic condition    HPI  Dakota Kim is a 47 year old male with a past medical history of Diabetes.  He is here today for follow up.   Current Status: Since his last office visit, he is doing well with no complaints. He currently not taking Metformin because of adverse GI side effects. He states that he regularly checks his blood glucose levels twice a day. His most recent blood glucose readings ranged from 200-225.  He denies fatigue, frequent urination, blurred vision, excessive hunger, excessive thirst, weight gain, weight loss, and poor wound healing.   He denies fevers, chills, recent infections, weight loss, and night sweats. He has not had any headaches, visual changes, dizziness, and falls. No chest pain, heart palpitations, cough and shortness of breath reported. No reports of GI problems such as nausea, vomiting, diarrhea, and constipation. He has no reports of blood in stools, dysuria and hematuria. No depression or anxiety reported. He denies pain today.   Review of Systems  Constitutional: Negative.   HENT: Negative.   Eyes: Negative.   Respiratory: Negative.   Cardiovascular: Negative.   Gastrointestinal: Negative.   Endocrine: Negative.   Genitourinary: Negative.   Musculoskeletal: Negative.   Skin: Negative.   Allergic/Immunologic: Negative.   Neurological: Negative.   Hematological: Negative.   Psychiatric/Behavioral: Negative.    Objective:   Physical Exam Vitals signs and nursing note reviewed.  Constitutional:      Appearance: Normal appearance. He is normal weight.  HENT:     Head: Normocephalic.     Right Ear: Tympanic membrane, ear canal and external ear normal.     Left Ear: Tympanic membrane, ear canal and external ear normal.     Nose: Nose normal.     Mouth/Throat:     Mouth:  Mucous membranes are dry.     Pharynx: Oropharynx is clear.  Eyes:     Extraocular Movements: Extraocular movements intact.     Conjunctiva/sclera: Conjunctivae normal.     Pupils: Pupils are equal, round, and reactive to light.  Neck:     Musculoskeletal: Normal range of motion and neck supple.  Cardiovascular:     Rate and Rhythm: Normal rate and regular rhythm.     Pulses: Normal pulses.     Heart sounds: Normal heart sounds.  Pulmonary:     Effort: Pulmonary effort is normal.     Breath sounds: Normal breath sounds.  Abdominal:     General: Bowel sounds are normal. There is distension.     Palpations: Abdomen is soft.  Musculoskeletal: Normal range of motion.  Skin:    General: Skin is warm and dry.  Neurological:     Mental Status: He is alert.  Psychiatric:        Mood and Affect: Mood normal.        Behavior: Behavior normal.        Judgment: Judgment normal.    Assessment & Plan:   1. Type 2 diabetes mellitus without complication, with long-term current use of insulin (HCC) Much improved. Hgb A1c is decreased at 8.3, from 12.7 on 03/28/2017. He has discontinued Metformin. He will continue insulin as prescribed. Continue monitoring blood glucose often. She will continue to decrease foods/beverages high in sugars and carbs and follow Heart Healthy  or DASH diet. Increase physical activity to at least 30 minutes cardio exercise daily.  - POCT glycosylated hemoglobin (Hb A1C) - POCT urinalysis dipstick - POCT glucose (manual entry) - HM Diabetes Foot Exam  2. Encounter for diabetic foot exam (HCC) Negative. Foot exam tolerated well. No decreased sensitivity noted upon foot exam. Patient counseled on proper foot hygiene. She is encouraged to exam feet often (daily), using mirror if necessary; keep feet clean and dry (especially between toes), keep feet moistened, wear cotton socks, and avoid wearing open-toed shoes, high-heel shoes, and sandals. Patient verbalized  understanding.   3. Healthcare maintenance - Microalbumin, urine - CBC with Differential - Comprehensive metabolic panel - Lipid Panel - PSA - TSH  4. Follow up He will follow up in 6 months.   No orders of the defined types were placed in this encounter.  Raliegh IpNatalie Laloni Rowton,  MSN, FNP-C [facility] [address]  [place], [place] [id number] [phone]

## 2018-08-24 LAB — COMPREHENSIVE METABOLIC PANEL
ALT: 23 IU/L (ref 0–44)
AST: 15 IU/L (ref 0–40)
Albumin/Globulin Ratio: 1.7 (ref 1.2–2.2)
Albumin: 4.3 g/dL (ref 3.5–5.5)
Alkaline Phosphatase: 79 IU/L (ref 39–117)
BUN/Creatinine Ratio: 13 (ref 9–20)
BUN: 12 mg/dL (ref 6–24)
Bilirubin Total: 0.4 mg/dL (ref 0.0–1.2)
CO2: 22 mmol/L (ref 20–29)
Calcium: 9 mg/dL (ref 8.7–10.2)
Chloride: 102 mmol/L (ref 96–106)
Creatinine, Ser: 0.94 mg/dL (ref 0.76–1.27)
GFR calc Af Amer: 111 mL/min/{1.73_m2} (ref >59)
GFR calc non Af Amer: 96 mL/min/{1.73_m2} (ref >59)
Globulin, Total: 2.5 g/dL (ref 1.5–4.5)
Glucose: 187 mg/dL — ABNORMAL HIGH (ref 65–99)
Potassium: 4 mmol/L (ref 3.5–5.2)
Sodium: 139 mmol/L (ref 134–144)
Total Protein: 6.8 g/dL (ref 6.0–8.5)

## 2018-08-24 LAB — CBC WITH DIFFERENTIAL/PLATELET
Basophils Absolute: 0 10*3/uL (ref 0.0–0.2)
Basos: 1 %
EOS (ABSOLUTE): 0 10*3/uL (ref 0.0–0.4)
Eos: 1 %
Hematocrit: 49.2 % (ref 37.5–51.0)
Hemoglobin: 15.6 g/dL (ref 13.0–17.7)
Immature Grans (Abs): 0 10*3/uL (ref 0.0–0.1)
Immature Granulocytes: 0 %
Lymphocytes Absolute: 1.9 10*3/uL (ref 0.7–3.1)
Lymphs: 25 %
MCH: 28.6 pg (ref 26.6–33.0)
MCHC: 31.7 g/dL (ref 31.5–35.7)
MCV: 90 fL (ref 79–97)
Monocytes Absolute: 0.7 10*3/uL (ref 0.1–0.9)
Monocytes: 9 %
Neutrophils Absolute: 5 10*3/uL (ref 1.4–7.0)
Neutrophils: 64 %
Platelets: 227 10*3/uL (ref 150–450)
RBC: 5.45 x10E6/uL (ref 4.14–5.80)
RDW: 13.7 % (ref 12.3–15.4)
WBC: 7.7 10*3/uL (ref 3.4–10.8)

## 2018-08-24 LAB — LIPID PANEL
Chol/HDL Ratio: 3.5 ratio (ref 0.0–5.0)
Cholesterol, Total: 148 mg/dL (ref 100–199)
HDL: 42 mg/dL (ref >39)
LDL Calculated: 74 mg/dL (ref 0–99)
Triglycerides: 160 mg/dL — ABNORMAL HIGH (ref 0–149)
VLDL Cholesterol Cal: 32 mg/dL (ref 5–40)

## 2018-08-24 LAB — TSH: TSH: 1.02 u[IU]/mL (ref 0.450–4.500)

## 2018-08-24 LAB — MICROALBUMIN, URINE: Microalbumin, Urine: 7.6 ug/mL

## 2018-08-24 LAB — PSA: Prostate Specific Ag, Serum: 1 ng/mL (ref 0.0–4.0)

## 2018-08-26 ENCOUNTER — Other Ambulatory Visit: Payer: Self-pay

## 2018-08-26 MED ORDER — INSULIN LISPRO PROT & LISPRO (75-25 MIX) 100 UNIT/ML KWIKPEN: 45.0000 [IU] | PEN_INJECTOR | Freq: Two times a day (BID) | SUBCUTANEOUS | 11 refills | Status: DC

## 2018-08-26 MED FILL — $HUMALOG MIX 75-25 KWIKPEN: (75-25) 100 | 100 days supply | Qty: 90 | Fill #0

## 2018-08-26 NOTE — Telephone Encounter (Signed)
Medication sent to pharmacy  

## 2018-08-28 ENCOUNTER — Other Ambulatory Visit: Payer: Self-pay | Admitting: Family Medicine

## 2018-08-28 DIAGNOSIS — E785 Hyperlipidemia, unspecified: Secondary | ICD-10-CM

## 2018-08-28 DIAGNOSIS — D649 Anemia, unspecified: Secondary | ICD-10-CM

## 2018-08-28 MED ORDER — ATORVASTATIN CALCIUM 10 MG PO TABS: 10.0000 mg | ORAL_TABLET | Freq: Every day | ORAL | 6 refills | Status: DC

## 2018-08-28 MED ORDER — FERROUS SULFATE 325 (65 FE) MG PO TABS: 325.0000 mg | ORAL_TABLET | Freq: Every day | ORAL | 3 refills | Status: DC

## 2018-08-28 NOTE — Progress Notes (Signed)
New Rx for Lipitor and Ferrous Sulfate sent to pharmacy today.

## 2018-08-29 ENCOUNTER — Other Ambulatory Visit: Payer: Self-pay

## 2018-08-29 DIAGNOSIS — E785 Hyperlipidemia, unspecified: Secondary | ICD-10-CM

## 2018-08-29 MED ORDER — ATORVASTATIN CALCIUM 10 MG PO TABS: 10.0000 mg | ORAL_TABLET | Freq: Every day | ORAL | 6 refills | Status: DC

## 2018-08-29 MED FILL — ATORVASTATIN 10 MG TABLET: 10 | 30 days supply | Qty: 30 | Fill #0

## 2018-08-29 NOTE — Telephone Encounter (Signed)
-----   Message from Kallie LocksNatalie M Stroud, FNP sent at 08/28/2018  9:01 PM EST ----- Regarding: "Lab Results" Cholesterol levels are elevated. New Rx for Lipitor sent to pharmacy today. Please inform patient. Thank you.

## 2018-08-29 NOTE — Telephone Encounter (Signed)
[facility] patient

## 2018-12-17 ENCOUNTER — Telehealth: Payer: Self-pay

## 2018-12-17 MED ORDER — INSULIN PEN NEEDLE 29G X 12.7MM MISC: 2 refills | Status: DC

## 2018-12-17 MED ORDER — INSULIN LISPRO PROT & LISPRO (75-25 MIX) 100 UNIT/ML KWIKPEN: 45.0000 [IU] | PEN_INJECTOR | Freq: Two times a day (BID) | SUBCUTANEOUS | 11 refills | Status: DC

## 2018-12-17 NOTE — Telephone Encounter (Signed)
Medication has been sent to pharmacy and patient is aware. 

## 2018-12-18 MED FILL — TRUEPLUS PEN NDL 31GX5/16": 31G X 8 MM | 30 days supply | Qty: 100 | Fill #0

## 2018-12-18 MED FILL — $HUMALOG MIX 75-25 KWIKPEN: (75-25) 100 | 30 days supply | Qty: 27 | Fill #0

## 2018-12-18 MED FILL — TRUEPLUS PEN NDL 31GX5/16: 31G X 8 MM | 30 days supply | Qty: 100 | Fill #0

## 2019-01-11 MED FILL — HUMALOG MIX 75-25 KWIKPEN: (75-25) 100 | 18 days supply | Qty: 9 | Fill #1

## 2019-02-04 MED FILL — ?HUMALOG MIX 75-25 KWIKPEN: (75-25) 100 | 30 days supply | Qty: 30 | Fill #2

## 2019-02-21 ENCOUNTER — Telehealth: Payer: Self-pay

## 2019-02-21 NOTE — Telephone Encounter (Signed)
Left a vm for patient to callback to do the screening for appointment

## 2019-02-24 ENCOUNTER — Encounter: Payer: Self-pay | Admitting: Family Medicine

## 2019-02-24 ENCOUNTER — Ambulatory Visit (INDEPENDENT_AMBULATORY_CARE_PROVIDER_SITE_OTHER): Payer: Self-pay | Admitting: Family Medicine

## 2019-02-24 ENCOUNTER — Other Ambulatory Visit: Payer: Self-pay

## 2019-02-24 VITALS — BP 140/90 | HR 74 | Temp 98.0°F | Ht 65.0 in | Wt 213.0 lb

## 2019-02-24 DIAGNOSIS — E66812 Obesity, class 2: Secondary | ICD-10-CM

## 2019-02-24 DIAGNOSIS — Z6835 Body mass index (BMI) 35.0-35.9, adult: Secondary | ICD-10-CM

## 2019-02-24 DIAGNOSIS — E785 Hyperlipidemia, unspecified: Secondary | ICD-10-CM

## 2019-02-24 DIAGNOSIS — E119 Type 2 diabetes mellitus without complications: Secondary | ICD-10-CM

## 2019-02-24 DIAGNOSIS — Z794 Long term (current) use of insulin: Secondary | ICD-10-CM

## 2019-02-24 DIAGNOSIS — Z09 Encounter for follow-up examination after completed treatment for conditions other than malignant neoplasm: Secondary | ICD-10-CM

## 2019-02-24 LAB — POCT URINALYSIS DIP (MANUAL ENTRY)
Bilirubin, UA: NEGATIVE
Blood, UA: NEGATIVE
Glucose, UA: 100 mg/dL — AB
Ketones, POC UA: NEGATIVE mg/dL
Leukocytes, UA: NEGATIVE
Nitrite, UA: NEGATIVE
Protein Ur, POC: NEGATIVE mg/dL
Spec Grav, UA: 1.02 (ref 1.010–1.025)
Urobilinogen, UA: 0.2 E.U./dL
pH, UA: 5 (ref 5.0–8.0)

## 2019-02-24 LAB — POCT GLYCOSYLATED HEMOGLOBIN (HGB A1C): Hemoglobin A1C: 10.9 % — AB (ref 4.0–5.6)

## 2019-02-24 MED ORDER — GLIPIZIDE 10 MG PO TABS: 10.0000 mg | ORAL_TABLET | Freq: Two times a day (BID) | ORAL | 3 refills | Status: DC

## 2019-02-24 MED ORDER — GLIMEPIRIDE 4 MG PO TABS: 4.0000 mg | ORAL_TABLET | Freq: Every day | ORAL | 3 refills | Status: DC

## 2019-02-24 MED ORDER — ATORVASTATIN CALCIUM 10 MG PO TABS: 10.0000 mg | ORAL_TABLET | Freq: Every day | ORAL | 3 refills | Status: DC

## 2019-02-24 MED FILL — ATORVASTATIN 10 MG TABLET: 10 | 30 days supply | Qty: 30 | Fill #0

## 2019-02-24 MED FILL — glipiZIDE 10 MG TABS: 10 | 30 days supply | Qty: 60 | Fill #0

## 2019-02-24 NOTE — Patient Instructions (Signed)
Diabetes Mellitus and Nutrition, Adult  When you have diabetes (diabetes mellitus), it is very important to have healthy eating habits because your blood sugar (glucose) levels are greatly affected by what you eat and drink. Eating healthy foods in the appropriate amounts, at about the same times every day, can help you:  · Control your blood glucose.  · Lower your risk of heart disease.  · Improve your blood pressure.  · Reach or maintain a healthy weight.  Every person with diabetes is different, and each person has different needs for a meal plan. Your health care provider may recommend that you work with a diet and nutrition specialist (dietitian) to make a meal plan that is best for you. Your meal plan may vary depending on factors such as:  · The calories you need.  · The medicines you take.  · Your weight.  · Your blood glucose, blood pressure, and cholesterol levels.  · Your activity level.  · Other health conditions you have, such as heart or kidney disease.  How do carbohydrates affect me?  Carbohydrates, also called carbs, affect your blood glucose level more than any other type of food. Eating carbs naturally raises the amount of glucose in your blood. Carb counting is a method for keeping track of how many carbs you eat. Counting carbs is important to keep your blood glucose at a healthy level, especially if you use insulin or take certain oral diabetes medicines.  It is important to know how many carbs you can safely have in each meal. This is different for every person. Your dietitian can help you calculate how many carbs you should have at each meal and for each snack.  Foods that contain carbs include:  · Bread, cereal, rice, pasta, and crackers.  · Potatoes and corn.  · Peas, beans, and lentils.  · Milk and yogurt.  · Fruit and juice.  · Desserts, such as cakes, cookies, ice cream, and candy.  How does alcohol affect me?  Alcohol can cause a sudden decrease in blood glucose (hypoglycemia),  especially if you use insulin or take certain oral diabetes medicines. Hypoglycemia can be a life-threatening condition. Symptoms of hypoglycemia (sleepiness, dizziness, and confusion) are similar to symptoms of having too much alcohol.  If your health care provider says that alcohol is safe for you, follow these guidelines:  · Limit alcohol intake to no more than 1 drink per day for nonpregnant women and 2 drinks per day for men. One drink equals 12 oz of beer, 5 oz of wine, or 1½ oz of hard liquor.  · Do not drink on an empty stomach.  · Keep yourself hydrated with water, diet soda, or unsweetened iced tea.  · Keep in mind that regular soda, juice, and other mixers may contain a lot of sugar and must be counted as carbs.  What are tips for following this plan?    Reading food labels  · Start by checking the serving size on the "Nutrition Facts" label of packaged foods and drinks. The amount of calories, carbs, fats, and other nutrients listed on the label is based on one serving of the item. Many items contain more than one serving per package.  · Check the total grams (g) of carbs in one serving. You can calculate the number of servings of carbs in one serving by dividing the total carbs by 15. For example, if a food has 30 g of total carbs, it would be equal to 2   servings of carbs.  · Check the number of grams (g) of saturated and trans fats in one serving. Choose foods that have low or no amount of these fats.  · Check the number of milligrams (mg) of salt (sodium) in one serving. Most people should limit total sodium intake to less than 2,300 mg per day.  · Always check the nutrition information of foods labeled as "low-fat" or "nonfat". These foods may be higher in added sugar or refined carbs and should be avoided.  · Talk to your dietitian to identify your daily goals for nutrients listed on the label.  Shopping  · Avoid buying canned, premade, or processed foods. These foods tend to be high in fat, sodium,  and added sugar.  · Shop around the outside edge of the grocery store. This includes fresh fruits and vegetables, bulk grains, fresh meats, and fresh dairy.  Cooking  · Use low-heat cooking methods, such as baking, instead of high-heat cooking methods like deep frying.  · Cook using healthy oils, such as olive, canola, or sunflower oil.  · Avoid cooking with butter, cream, or high-fat meats.  Meal planning  · Eat meals and snacks regularly, preferably at the same times every day. Avoid going long periods of time without eating.  · Eat foods high in fiber, such as fresh fruits, vegetables, beans, and whole grains. Talk to your dietitian about how many servings of carbs you can eat at each meal.  · Eat 4-6 ounces (oz) of lean protein each day, such as lean meat, chicken, fish, eggs, or tofu. One oz of lean protein is equal to:  ? 1 oz of meat, chicken, or fish.  ? 1 egg.  ? ¼ cup of tofu.  · Eat some foods each day that contain healthy fats, such as avocado, nuts, seeds, and fish.  Lifestyle  · Check your blood glucose regularly.  · Exercise regularly as told by your health care provider. This may include:  ? 150 minutes of moderate-intensity or vigorous-intensity exercise each week. This could be brisk walking, biking, or water aerobics.  ? Stretching and doing strength exercises, such as yoga or weightlifting, at least 2 times a week.  · Take medicines as told by your health care provider.  · Do not use any products that contain nicotine or tobacco, such as cigarettes and e-cigarettes. If you need help quitting, ask your health care provider.  · Work with a counselor or diabetes educator to identify strategies to manage stress and any emotional and social challenges.  Questions to ask a health care provider  · Do I need to meet with a diabetes educator?  · Do I need to meet with a dietitian?  · What number can I call if I have questions?  · When are the best times to check my blood glucose?  Where to find more  information:  · American Diabetes Association: diabetes.org  · Academy of Nutrition and Dietetics: www.eatright.org  · National Institute of Diabetes and Digestive and Kidney Diseases (NIH): www.niddk.nih.gov  Summary  · A healthy meal plan will help you control your blood glucose and maintain a healthy lifestyle.  · Working with a diet and nutrition specialist (dietitian) can help you make a meal plan that is best for you.  · Keep in mind that carbohydrates (carbs) and alcohol have immediate effects on your blood glucose levels. It is important to count carbs and to use alcohol carefully.  This information is not intended to   replace advice given to you by your health care provider. Make sure you discuss any questions you have with your health care provider.  Document Released: 05/25/2005 Document Revised: 03/28/2017 Document Reviewed: 10/02/2016  Elsevier Interactive Patient Education © 2019 Elsevier Inc.

## 2019-02-24 NOTE — Progress Notes (Signed)
Patient [name]   Established Patient Office Visit  Subjective:  Patient ID: Dakota Kim, male    DOB: 1971-01-02  Age: 48 y.o. MRN: [id number]  CC:  Chief Complaint  Patient presents with  . Follow-up    diabetes    HPI Dakota Kim is a 48 year old male who presents for Follow Up today.   Past Medical History:  Diagnosis Date  . Diabetes mellitus without complication ([place])    Current Status: Since his last office visit, he is doing well with no complaints. His most recent normal range of preprandial blood glucose levels have been between 160s. He has seen low range of 50 and high of 235 since his last office visit. He denies fatigue, frequent urination, blurred vision, excessive hunger, excessive thirst, weight gain, weight loss, and poor wound healing. He continues to check his feet regularly.   He denies fevers, chills, recent infections, weight loss, and night sweats. He has not had any headaches, dizziness, and falls. No chest pain, heart palpitations, cough and shortness of breath reported. No reports of GI problems such as nausea, vomiting, diarrhea, and constipation. He has no reports of blood in stools, dysuria and hematuria. No depression or anxiety reported. He denies pain today.   History reviewed. No pertinent surgical history.  Family History  Problem Relation Age of Onset  . Diabetes Mellitus II Father     Social History   Socioeconomic History  . Marital status: Single    Spouse name: Not on file  . Number of children: Not on file  . Years of education: Not on file  . Highest education level: Not on file  Occupational History  . Not on file  Social Needs  . Financial resource strain: Not on file  . Food insecurity    Worry: Not on file    Inability: Not on file  . Transportation needs    Medical: Not on file    Non-medical: Not on file  Tobacco Use  . Smoking status: Current Every Day Smoker   Last attempt to quit: 10/08/2012    Years since quitting: 6.3  . Smokeless tobacco: Never Used  Substance and Sexual Activity  . Alcohol use: Yes    Comment: occassionally  . Drug use: No  . Sexual activity: Not on file  Lifestyle  . Physical activity    Days per week: Not on file    Minutes per session: Not on file  . Stress: Not on file  Relationships  . Social Herbalist on phone: Not on file    Gets together: Not on file    Attends religious service: Not on file    Active member of club or organization: Not on file    Attends meetings of clubs or organizations: Not on file    Relationship status: Not on file  . Intimate partner violence    Fear of current or ex partner: Not on file    Emotionally abused: Not on file    Physically abused: Not on file    Forced sexual activity: Not on file  Other Topics Concern  . Not on file  Social History Narrative  . Not on file    Outpatient Medications Prior to Visit  Medication Sig Dispense Refill  . blood glucose meter kit and supplies KIT Dispense based on patient and insurance preference. Check blood sugar up to four times daily or as needed. ICD  10 E11.9 1 each 0  . Insulin Lispro Prot & Lispro (HUMALOG MIX 75/25 KWIKPEN) (75-25) 100 UNIT/ML Kwikpen Inject 45 Units into the skin 2 (two) times daily with breakfast and lunch. 15 mL 11  . Insulin Pen Needle (BD ULTRA-FINE PEN NEEDLES) 29G X 12.7MM MISC ICD10 E11.9 for use with insulin administration 200 each 2  . atorvastatin (LIPITOR) 10 MG tablet Take 1 tablet (10 mg total) by mouth daily. 30 tablet 6  . ferrous sulfate 325 (65 FE) MG tablet Take 1 tablet (325 mg total) by mouth daily. 30 tablet 3  . metFORMIN (GLUCOPHAGE) 1000 MG tablet Take 1 tablet (1,000 mg total) by mouth 2 (two) times daily with a meal. (Patient not taking: Reported on 08/23/2018) 180 tablet 3   No facility-administered medications prior to visit.     No Known Allergies  ROS Review of Systems   Constitutional: Negative.   HENT: Negative.   Eyes: Negative.   Respiratory: Negative.   Cardiovascular: Negative.   Gastrointestinal: Negative.   Endocrine: Negative.   Genitourinary: Negative.   Musculoskeletal: Negative.   Skin: Negative.   Allergic/Immunologic: Negative.   Neurological: Negative.   Hematological: Negative.   Psychiatric/Behavioral: Negative.       Objective:    Physical Exam  Constitutional: He is oriented to person, place, and time. He appears well-developed and well-nourished.  HENT:  Head: Normocephalic and atraumatic.  Eyes: Conjunctivae are normal.  Cardiovascular: Normal rate, regular rhythm, normal heart sounds and intact distal pulses.  Pulmonary/Chest: Effort normal and breath sounds normal.  Abdominal: Soft. Bowel sounds are normal.  Musculoskeletal: Normal range of motion.  Neurological: He is alert and oriented to person, place, and time. He has normal reflexes.  Skin: Skin is warm and dry.  Psychiatric: He has a normal mood and affect. His behavior is normal. Judgment and thought content normal.  Nursing note and vitals reviewed.   BP 140/90 (BP Location: Left Arm, Patient Position: Sitting, Cuff Size: Large)   Pulse 74   Temp 98 F (36.7 C) (Oral)   Ht '5\' 5"'  (1.651 m)   Wt 213 lb (96.6 kg)   SpO2 95%   BMI 35.45 kg/m  Wt Readings from Last 3 Encounters:  02/24/19 213 lb (96.6 kg)  08/23/18 215 lb (97.5 kg)  03/11/18 200 lb (90.7 kg)     Health Maintenance Due  Topic Date Due  . PNEUMOCOCCAL POLYSACCHARIDE VACCINE AGE 23-64 HIGH RISK  09/09/1972  . OPHTHALMOLOGY EXAM  09/09/1980  . HIV Screening  09/09/1985  . HEMOGLOBIN A1C  02/22/2019    There are no preventive care reminders to display for this patient.  Lab Results  Component Value Date   TSH 1.020 08/23/2018   Lab Results  Component Value Date   WBC 7.7 08/23/2018   HGB 15.6 08/23/2018   HCT 49.2 08/23/2018   MCV 90 08/23/2018   PLT 227 08/23/2018   Lab  Results  Component Value Date   NA 139 08/23/2018   K 4.0 08/23/2018   CO2 22 08/23/2018   GLUCOSE 187 (H) 08/23/2018   BUN 12 08/23/2018   CREATININE 0.94 08/23/2018   BILITOT 0.4 08/23/2018   ALKPHOS 79 08/23/2018   AST 15 08/23/2018   ALT 23 08/23/2018   PROT 6.8 08/23/2018   ALBUMIN 4.3 08/23/2018   CALCIUM 9.0 08/23/2018   ANIONGAP 10 04/03/2017   Lab Results  Component Value Date   CHOL 148 08/23/2018   Lab Results  Component Value  Date   HDL 42 08/23/2018   Lab Results  Component Value Date   LDLCALC 74 08/23/2018   Lab Results  Component Value Date   TRIG 160 (H) 08/23/2018   Lab Results  Component Value Date   CHOLHDL 3.5 08/23/2018   Lab Results  Component Value Date   HGBA1C 10.9 (A) 02/24/2019   Assessment & Plan:   1. Type 2 diabetes mellitus without complication, with long-term current use of insulin (HCC) Hgb A1c is increased at 10.9 today, from 8.3 on 08/24/2019.  He will continue to decrease foods/beverages high in sugars and carbs and follow Heart Healthy or DASH diet. Increase physical activity to at least 30 minutes cardio exercise daily.  - POCT glycosylated hemoglobin (Hb A1C) - POCT urinalysis dipstick - glimepiride (AMARYL) 4 MG tablet; Take 1 tablet (4 mg total) by mouth daily with breakfast.  Dispense: 30 tablet; Refill: 3 - glipiZIDE (GLUCOTROL) 10 MG tablet; Take 1 tablet (10 mg total) by mouth 2 (two) times daily before a meal.  Dispense: 60 tablet; Refill: 3  2. Hyperlipidemia, unspecified hyperlipidemia type - atorvastatin (LIPITOR) 10 MG tablet; Take 1 tablet (10 mg total) by mouth daily.  Dispense: 30 tablet; Refill: 3  3. Class 2 severe obesity due to excess calories with serious comorbidity and body mass index (BMI) of 35.0 to 35.9 in adult [facility]) Body mass index is 35.45 kg/m. Goal BMI  is <30. Encouraged efforts to reduce weight include engaging in physical activity as tolerated with goal of 150 minutes per week. Improve  dietary choices and eat a meal regimen consistent with a Mediterranean or DASH diet. Reduce simple carbohydrates. Do not skip meals and eat healthy snacks throughout the day to avoid over-eating at dinner. Set a goal weight loss that is achievable for you.  4. Follow up He will follow up in 6 months.   Meds ordered this encounter  Medications  . glimepiride (AMARYL) 4 MG tablet    Sig: Take 1 tablet (4 mg total) by mouth daily with breakfast.    Dispense:  30 tablet    Refill:  3  . glipiZIDE (GLUCOTROL) 10 MG tablet    Sig: Take 1 tablet (10 mg total) by mouth 2 (two) times daily before a meal.    Dispense:  60 tablet    Refill:  3  . atorvastatin (LIPITOR) 10 MG tablet    Sig: Take 1 tablet (10 mg total) by mouth daily.    Dispense:  30 tablet    Refill:  3    Orders Placed This Encounter  Procedures  . POCT glycosylated hemoglobin (Hb A1C)  . POCT urinalysis dipstick    Referral Orders  No referral(s) requested today    Kathe Becton,  MSN, FNP-BC Patient [place] [place] [phone]  Problem List Items Addressed This Visit      Endocrine   Type 2 diabetes mellitus without complication, with long-term current use of insulin ([place]) - Primary   Relevant Medications   glimepiride (AMARYL) 4 MG tablet   glipiZIDE (GLUCOTROL) 10 MG tablet   atorvastatin (LIPITOR) 10 MG tablet   Other Relevant Orders   POCT glycosylated hemoglobin (Hb A1C) (Completed)   POCT urinalysis dipstick (Completed)    Other Visit Diagnoses    Hyperlipidemia, unspecified hyperlipidemia type       Relevant Medications   atorvastatin (LIPITOR) 10 MG tablet   Class 2 severe obesity due  to excess calories with serious comorbidity and body mass index (BMI) of 35.0 to 35.9 in adult [facility])       Relevant Medications   glimepiride (AMARYL) 4 MG tablet   glipiZIDE (GLUCOTROL) 10 MG tablet   Follow up          Meds ordered this  encounter  Medications  . glimepiride (AMARYL) 4 MG tablet    Sig: Take 1 tablet (4 mg total) by mouth daily with breakfast.    Dispense:  30 tablet    Refill:  3  . glipiZIDE (GLUCOTROL) 10 MG tablet    Sig: Take 1 tablet (10 mg total) by mouth 2 (two) times daily before a meal.    Dispense:  60 tablet    Refill:  3  . atorvastatin (LIPITOR) 10 MG tablet    Sig: Take 1 tablet (10 mg total) by mouth daily.    Dispense:  30 tablet    Refill:  3    Follow-up: Return in about 6 months (around 08/26/2019).    Azzie Glatter, FNP

## 2019-02-26 DIAGNOSIS — E785 Hyperlipidemia, unspecified: Secondary | ICD-10-CM | POA: Insufficient documentation

## 2019-02-26 DIAGNOSIS — Z6835 Body mass index (BMI) 35.0-35.9, adult: Secondary | ICD-10-CM | POA: Insufficient documentation

## 2019-02-26 DIAGNOSIS — E66812 Obesity, class 2: Secondary | ICD-10-CM | POA: Insufficient documentation

## 2019-03-05 MED FILL — TRUEPLUS PEN NDL 31GX5/16": 31G X 8 MM | 30 days supply | Qty: 100 | Fill #1

## 2019-03-05 MED FILL — TRUEPLUS PEN NDL 31GX5/16: 31G X 8 MM | 30 days supply | Qty: 100 | Fill #1

## 2019-03-10 MED FILL — ?HUMALOG MIX 75-25 KWIKPEN: (75-25) 100 | 30 days supply | Qty: 30 | Fill #3

## 2019-04-07 ENCOUNTER — Emergency Department (HOSPITAL_COMMUNITY)
Admission: EM | Admit: 2019-04-07 | Discharge: 2019-04-07 | Disposition: A | Discharge status: Home / Self Care | Payer: Self-pay | Attending: Emergency Medicine | Admitting: Emergency Medicine

## 2019-04-07 ENCOUNTER — Other Ambulatory Visit: Payer: Self-pay

## 2019-04-07 ENCOUNTER — Encounter (HOSPITAL_COMMUNITY): Payer: Self-pay | Admitting: Emergency Medicine

## 2019-04-07 DIAGNOSIS — Z5321 Procedure and treatment not carried out due to patient leaving prior to being seen by health care provider: Secondary | ICD-10-CM | POA: Insufficient documentation

## 2019-04-07 DIAGNOSIS — L989 Disorder of the skin and subcutaneous tissue, unspecified: Secondary | ICD-10-CM | POA: Insufficient documentation

## 2019-04-07 NOTE — ED Triage Notes (Signed)
Pt reports possible abscess to posterior neck with radiation down into shoulders. Pt states pain started this morning.

## 2019-04-14 MED FILL — ?HUMALOG MIX 75-25 KWIKPEN: (75-25) 100 | 30 days supply | Qty: 30 | Fill #4

## 2019-06-02 MED FILL — ?HUMALOG MIX 75-25 KWIKPEN: (75-25) 100 | 30 days supply | Qty: 30 | Fill #5

## 2019-07-07 MED FILL — ?HUMALOG MIX 75-25 KWIKPEN: (75-25) 100 | 24 days supply | Qty: 24 | Fill #6

## 2019-08-18 MED FILL — TRUEPLUS PEN NDL 31GX5/16": 31G X 8 MM | 30 days supply | Qty: 100 | Fill #2

## 2019-08-18 MED FILL — TRUEPLUS PEN NDL 31GX5/16: 31G X 8 MM | 30 days supply | Qty: 100 | Fill #2

## 2019-08-18 MED FILL — ?HUMALOG MIX 75-25 KWIKPEN: (75-25) 100 | 30 days supply | Qty: 27 | Fill #1

## 2019-08-26 ENCOUNTER — Ambulatory Visit: Payer: Self-pay | Admitting: Family Medicine

## 2019-09-12 DIAGNOSIS — E559 Vitamin D deficiency, unspecified: Secondary | ICD-10-CM

## 2019-09-12 HISTORY — DX: Vitamin D deficiency, unspecified: E55.9

## 2019-09-15 ENCOUNTER — Ambulatory Visit (HOSPITAL_COMMUNITY)
Admission: EM | Admit: 2019-09-15 | Discharge: 2019-09-15 | Disposition: A | Discharge status: Home / Self Care | Payer: HRSA Program | Attending: Internal Medicine | Admitting: Internal Medicine

## 2019-09-15 ENCOUNTER — Other Ambulatory Visit: Payer: Self-pay

## 2019-09-15 ENCOUNTER — Encounter (HOSPITAL_COMMUNITY): Payer: Self-pay

## 2019-09-15 DIAGNOSIS — Z20822 Contact with and (suspected) exposure to covid-19: Secondary | ICD-10-CM | POA: Insufficient documentation

## 2019-09-15 NOTE — Discharge Instructions (Signed)
Person Under Monitoring Name: [facility]  Location: [address] [place] [address]   Infection Prevention Recommendations for Individuals Confirmed to have, or Being Evaluated for, 2019 Novel Coronavirus (COVID-19) Infection Who Receive Care at Home  Individuals who are confirmed to have, or are being evaluated for, COVID-19 should follow the prevention steps below until a healthcare provider or local or state health department says they can return to normal activities.  Stay home except to get medical care You should restrict activities outside your home, except for getting medical care. Do not go to work, school, or public areas, and do not use public transportation or taxis.  Call ahead before visiting your doctor Before your medical appointment, call the healthcare provider and tell them that you have, or are being evaluated for, COVID-19 infection. This will help the healthcare provider's office take steps to keep other people from getting infected. Ask your healthcare provider to call the local or state health department.  Monitor your symptoms Seek prompt medical attention if your illness is worsening (e.g., difficulty breathing). Before going to your medical appointment, call the healthcare provider and tell them that you have, or are being evaluated for, COVID-19 infection. Ask your healthcare provider to call the local or state health department.  Wear a facemask You should wear a facemask that covers your nose and mouth when you are in the same room with other people and when you visit a healthcare provider. People who live with or visit you should also wear a facemask while they are in the same room with you.  Separate yourself from other people in your home As much as possible, you should stay in a different room from other people in your home. Also, you should use a separate bathroom, if available.  Avoid sharing household items You should  not share dishes, drinking glasses, cups, eating utensils, towels, bedding, or other items with other people in your home. After using these items, you should wash them thoroughly with soap and water.  Cover your coughs and sneezes Cover your mouth and nose with a tissue when you cough or sneeze, or you can cough or sneeze into your sleeve. Throw used tissues in a lined trash can, and immediately wash your hands with soap and water for at least 20 seconds or use an alcohol-based hand rub.  Wash your [employer] your hands often and thoroughly with soap and water for at least 20 seconds. You can use an alcohol-based hand sanitizer if soap and water are not available and if your hands are not visibly dirty. Avoid touching your eyes, nose, and mouth with unwashed hands.   Prevention Steps for Caregivers and Household Members of Individuals Confirmed to have, or Being Evaluated for, COVID-19 Infection Being Cared for in the Home  If you live with, or provide care at home for, a person confirmed to have, or being evaluated for, COVID-19 infection please follow these guidelines to prevent infection:  Follow healthcare provider's instructions Make sure that you understand and can help the patient follow any healthcare provider instructions for all care.  Provide for the patient's basic needs You should help the patient with basic needs in the home and provide support for getting groceries, prescriptions, and other personal needs.  Monitor the patient's symptoms If they are getting sicker, call his or her medical provider and tell them that the patient has, or is being evaluated for, COVID-19 infection. This will help the healthcare provider's  office take steps to keep other people from getting infected. Ask the healthcare provider to call the local or state health department.  Limit the number of people who have contact with the patient If possible, have only one caregiver for the  patient. Other household members should stay in another home or place of residence. If this is not possible, they should stay in another room, or be separated from the patient as much as possible. Use a separate bathroom, if available. Restrict visitors who do not have an essential need to be in the home.  Keep older adults, very young children, and other sick people away from the patient Keep older adults, very young children, and those who have compromised immune systems or chronic health conditions away from the patient. This includes people with chronic heart, lung, or kidney conditions, diabetes, and cancer.  Ensure good ventilation Make sure that shared spaces in the home have good air flow, such as from an air conditioner or an opened window, weather permitting.  Wash your hands often Wash your hands often and thoroughly with soap and water for at least 20 seconds. You can use an alcohol based hand sanitizer if soap and water are not available and if your hands are not visibly dirty. Avoid touching your eyes, nose, and mouth with unwashed hands. Use disposable paper towels to dry your hands. If not available, use dedicated cloth towels and replace them when they become wet.  Wear a facemask and gloves Wear a disposable facemask at all times in the room and gloves when you touch or have contact with the patient's blood, body fluids, and/or secretions or excretions, such as sweat, saliva, sputum, nasal mucus, vomit, urine, or feces.  Ensure the mask fits over your nose and mouth tightly, and do not touch it during use. Throw out disposable facemasks and gloves after using them. Do not reuse. Wash your hands immediately after removing your facemask and gloves. If your personal clothing becomes contaminated, carefully remove clothing and launder. Wash your hands after handling contaminated clothing. Place all used disposable facemasks, gloves, and other waste in a lined container before  disposing them with other household waste. Remove gloves and wash your hands immediately after handling these items.  Do not share dishes, glasses, or other household items with the patient Avoid sharing household items. You should not share dishes, drinking glasses, cups, eating utensils, towels, bedding, or other items with a patient who is confirmed to have, or being evaluated for, COVID-19 infection. After the person uses these items, you should wash them thoroughly with soap and water.  Wash laundry thoroughly Immediately remove and wash clothes or bedding that have blood, body fluids, and/or secretions or excretions, such as sweat, saliva, sputum, nasal mucus, vomit, urine, or feces, on them. Wear gloves when handling laundry from the patient. Read and follow directions on labels of laundry or clothing items and detergent. In general, wash and dry with the warmest temperatures recommended on the label.  Clean all areas the individual has used often Clean all touchable surfaces, such as counters, tabletops, doorknobs, bathroom fixtures, toilets, phones, keyboards, tablets, and bedside tables, every day. Also, clean any surfaces that may have blood, body fluids, and/or secretions or excretions on them. Wear gloves when cleaning surfaces the patient has come in contact with. Use a diluted bleach solution (e.g., dilute bleach with 1 part bleach and 10 parts water) or a household disinfectant with a label that says EPA-registered for coronaviruses. To make a  bleach solution at home, add 1 tablespoon of bleach to 1 quart (4 cups) of water. For a larger supply, add  cup of bleach to 1 gallon (16 cups) of water. Read labels of cleaning products and follow recommendations provided on product labels. Labels contain instructions for safe and effective use of the cleaning product including precautions you should take when applying the product, such as wearing gloves or eye protection and making sure you  have good ventilation during use of the product. Remove gloves and wash hands immediately after cleaning.  Monitor yourself for signs and symptoms of illness Caregivers and household members are considered close contacts, should monitor their health, and will be asked to limit movement outside of the home to the extent possible. Follow the monitoring steps for close contacts listed on the symptom monitoring form.   ? If you have additional questions, contact your local health department or call the epidemiologist on call at [phone] (available 24/7). ? This guidance is subject to change. For the most up-to-date guidance from [facility], please refer to their website: [email or web address]

## 2019-09-15 NOTE — ED Provider Notes (Signed)
[facility]    CSN: [id number] Arrival date & time: 09/15/19  0806      History   Chief Complaint Chief Complaint  Patient presents with  . covid test    HPI Gaspare Netzel is a 49 y.o. male History of DM type II presenting today for Covid testing.  Patient states that he has had indirect exposure to Covid through his sister.  His sister had a Covid exposure at work approximately 2 days ago.  Her test was pending, is wanting to be screened as well.  He has felt at his baseline.  Denies any URI symptoms, does endorse a cough, but this is normal for him as he does use tobacco.  Smokes approximately 5 to 6 cigarettes/day.  He denies any GI symptoms.  Denies any fever chills or body aches.    HPI  Past Medical History:  Diagnosis Date  . Diabetes mellitus without complication [facility])     Patient Active Problem List   Diagnosis Date Noted  . Hyperlipidemia 02/26/2019  . Class 2 severe obesity due to excess calories with serious comorbidity and body mass index (BMI) of 35.0 to 35.9 in adult ([place]) 02/26/2019  . Type 2 diabetes mellitus without complication, with long-term current use of insulin ([place]) 05/13/2017  . DKA (diabetic ketoacidoses) ([place]) 11/06/2012  . Hypokalemia 11/06/2012    History reviewed. No pertinent surgical history.     Home Medications    Prior to Admission medications   Medication Sig Start Date End Date Taking? Authorizing Provider  glipiZIDE (GLUCOTROL) 10 MG tablet Take 1 tablet (10 mg total) by mouth 2 (two) times daily before a meal. 02/24/19  Yes Azzie Glatter, FNP  blood glucose meter kit and supplies KIT Dispense based on patient and insurance preference. Check blood sugar up to four times daily or as needed. ICD 10 E11.9 04/10/17   Scot Jun, FNP  ferrous sulfate 325 (65 FE) MG tablet Take 1 tablet (325 mg total) by mouth daily. 08/28/18 12/26/18  Azzie Glatter, FNP  Insulin Lispro Prot & Lispro (HUMALOG MIX 75/25 KWIKPEN)  (75-25) 100 UNIT/ML Kwikpen Inject 45 Units into the skin 2 (two) times daily with breakfast and lunch. 12/17/18   Azzie Glatter, FNP  atorvastatin (LIPITOR) 10 MG tablet Take 1 tablet (10 mg total) by mouth daily. 02/24/19 09/15/19  Azzie Glatter, FNP  glimepiride (AMARYL) 4 MG tablet Take 1 tablet (4 mg total) by mouth daily with breakfast. 02/24/19 09/15/19  Azzie Glatter, FNP    Family History Family History  Problem Relation Age of Onset  . Diabetes Mellitus II Father     Social History Social History   Tobacco Use  . Smoking status: Current Every Day Smoker    Last attempt to quit: 10/08/2012    Years since quitting: 6.9  . Smokeless tobacco: Never Used  Substance Use Topics  . Alcohol use: Yes    Comment: occassionally  . Drug use: No     Allergies   Patient has no known allergies.   Review of Systems Review of Systems  Constitutional: Negative for activity change, appetite change, chills, fatigue and fever.  HENT: Negative for congestion, ear pain, rhinorrhea, sinus pressure, sore throat and trouble swallowing.   Eyes: Negative for discharge and redness.  Respiratory: Positive for cough. Negative for chest tightness and shortness of breath.   Cardiovascular: Negative for chest pain.  Gastrointestinal: Negative for abdominal pain, diarrhea, nausea and vomiting.  Musculoskeletal:  Negative for myalgias.  Skin: Negative for rash.  Neurological: Negative for dizziness, light-headedness and headaches.     Physical Exam Triage Vital Signs ED Triage Vitals  Enc Vitals Group     BP 09/15/19 0825 (!) 131/91     Pulse Rate 09/15/19 0825 84     Resp 09/15/19 0825 16     Temp 09/15/19 0825 98.1 F (36.7 C)     Temp Source 09/15/19 0825 Oral     SpO2 09/15/19 0825 99 %     Weight --      Height --      Head Circumference --      Peak Flow --      Pain Score 09/15/19 0820 0     Pain Loc --      Pain Edu? --      Excl. in [place]? --    No data found.  Updated  Vital Signs BP 134/88 Comment: re positioned  Pulse 84   Temp 98.1 F (36.7 C) (Oral)   Resp 16   SpO2 99%   Visual Acuity Right Eye Distance:   Left Eye Distance:   Bilateral Distance:    Right Eye Near:   Left Eye Near:    Bilateral Near:     Physical Exam Vitals and nursing note reviewed.  Constitutional:      Appearance: He is well-developed.     Comments: No acute distress  HENT:     Head: Normocephalic and atraumatic.     Nose: Nose normal.  Eyes:     Conjunctiva/sclera: Conjunctivae normal.  Cardiovascular:     Rate and Rhythm: Normal rate.  Pulmonary:     Effort: Pulmonary effort is normal. No respiratory distress.     Breath sounds: Wheezing present.     Comments: Faint wheezing noted in bilateral upper lung fields, lower lung fields clear bilaterally Abdominal:     General: There is no distension.  Musculoskeletal:        General: Normal range of motion.     Cervical back: Neck supple.  Skin:    General: Skin is warm and dry.  Neurological:     Mental Status: He is alert and oriented to person, place, and time.      UC Treatments / Results  Labs (all labs ordered are listed, but only abnormal results are displayed) Labs Reviewed  NOVEL CORONAVIRUS, NAA (HOSP ORDER, SEND-OUT TO REF LAB; TAT 18-24 HRS)    EKG   Radiology No results found.  Procedures Procedures (including critical care time)  Medications Ordered in UC Medications - No data to display  Initial Impression / Assessment and Plan / UC Course  I have reviewed the triage vital signs and the nursing notes.  Pertinent labs & imaging results that were available during my care of the patient were reviewed by me and considered in my medical decision making (see chart for details).     Covid swab pending, patient currently denying any symptoms.  Recommended to distance from sister over the next 10 days and discussed possible false negative shortly after exposure.  Discussed strict  return precautions. Patient verbalized understanding and is agreeable with plan.  Final Clinical Impressions(s) / UC Diagnoses   Final diagnoses:  Exposure to COVID-19 virus  Encounter for laboratory testing for COVID-19 virus     Discharge Instructions        Person Under Monitoring Name: [facility]  Location: [address] [place] [address]  Infection Prevention Recommendations for Individuals Confirmed to have, or Being Evaluated for, 2019 Novel Coronavirus (COVID-19) Infection Who Receive Care at Home  Individuals who are confirmed to have, or are being evaluated for, COVID-19 should follow the prevention steps below until a healthcare provider or local or state health department says they can return to normal activities.  Stay home except to get medical care You should restrict activities outside your home, except for getting medical care. Do not go to work, school, or public areas, and do not use public transportation or taxis.  Call ahead before visiting your doctor Before your medical appointment, call the healthcare provider and tell them that you have, or are being evaluated for, COVID-19 infection. This will help the healthcare provider's office take steps to keep other people from getting infected. Ask your healthcare provider to call the local or state health department.  Monitor your symptoms Seek prompt medical attention if your illness is worsening (e.g., difficulty breathing). Before going to your medical appointment, call the healthcare provider and tell them that you have, or are being evaluated for, COVID-19 infection. Ask your healthcare provider to call the local or state health department.  Wear a facemask You should wear a facemask that covers your nose and mouth when you are in the same room with other people and when you visit a healthcare provider. People who live with or visit you should also wear a facemask while they are in  the same room with you.  Separate yourself from other people in your home As much as possible, you should stay in a different room from other people in your home. Also, you should use a separate bathroom, if available.  Avoid sharing household items You should not share dishes, drinking glasses, cups, eating utensils, towels, bedding, or other items with other people in your home. After using these items, you should wash them thoroughly with soap and water.  Cover your coughs and sneezes Cover your mouth and nose with a tissue when you cough or sneeze, or you can cough or sneeze into your sleeve. Throw used tissues in a lined trash can, and immediately wash your hands with soap and water for at least 20 seconds or use an alcohol-based hand rub.  Wash your [employer] your hands often and thoroughly with soap and water for at least 20 seconds. You can use an alcohol-based hand sanitizer if soap and water are not available and if your hands are not visibly dirty. Avoid touching your eyes, nose, and mouth with unwashed hands.   Prevention Steps for Caregivers and Household Members of Individuals Confirmed to have, or Being Evaluated for, COVID-19 Infection Being Cared for in the Home  If you live with, or provide care at home for, a person confirmed to have, or being evaluated for, COVID-19 infection please follow these guidelines to prevent infection:  Follow healthcare provider's instructions Make sure that you understand and can help the patient follow any healthcare provider instructions for all care.  Provide for the patient's basic needs You should help the patient with basic needs in the home and provide support for getting groceries, prescriptions, and other personal needs.  Monitor the patient's symptoms If they are getting sicker, call his or her medical provider and tell them that the patient has, or is being evaluated for, COVID-19 infection. This will help the healthcare  provider's office take steps to keep other people from getting infected. Ask the healthcare provider to call the local or  state health department.  Limit the number of people who have contact with the patient  If possible, have only one caregiver for the patient.  Other household members should stay in another home or place of residence. If this is not possible, they should stay  in another room, or be separated from the patient as much as possible. Use a separate bathroom, if available.  Restrict visitors who do not have an essential need to be in the home.  Keep older adults, very young children, and other sick people away from the patient Keep older adults, very young children, and those who have compromised immune systems or chronic health conditions away from the patient. This includes people with chronic heart, lung, or kidney conditions, diabetes, and cancer.  Ensure good ventilation Make sure that shared spaces in the home have good air flow, such as from an air conditioner or an opened window, weather permitting.  Wash your hands often  Wash your hands often and thoroughly with soap and water for at least 20 seconds. You can use an alcohol based hand sanitizer if soap and water are not available and if your hands are not visibly dirty.  Avoid touching your eyes, nose, and mouth with unwashed hands.  Use disposable paper towels to dry your hands. If not available, use dedicated cloth towels and replace them when they become wet.  Wear a facemask and gloves  Wear a disposable facemask at all times in the room and gloves when you touch or have contact with the patient's blood, body fluids, and/or secretions or excretions, such as sweat, saliva, sputum, nasal mucus, vomit, urine, or feces.  Ensure the mask fits over your nose and mouth tightly, and do not touch it during use.  Throw out disposable facemasks and gloves after using them. Do not reuse.  Wash your hands immediately  after removing your facemask and gloves.  If your personal clothing becomes contaminated, carefully remove clothing and launder. Wash your hands after handling contaminated clothing.  Place all used disposable facemasks, gloves, and other waste in a lined container before disposing them with other household waste.  Remove gloves and wash your hands immediately after handling these items.  Do not share dishes, glasses, or other household items with the patient  Avoid sharing household items. You should not share dishes, drinking glasses, cups, eating utensils, towels, bedding, or other items with a patient who is confirmed to have, or being evaluated for, COVID-19 infection.  After the person uses these items, you should wash them thoroughly with soap and water.  Wash laundry thoroughly  Immediately remove and wash clothes or bedding that have blood, body fluids, and/or secretions or excretions, such as sweat, saliva, sputum, nasal mucus, vomit, urine, or feces, on them.  Wear gloves when handling laundry from the patient.  Read and follow directions on labels of laundry or clothing items and detergent. In general, wash and dry with the warmest temperatures recommended on the label.  Clean all areas the individual has used often  Clean all touchable surfaces, such as counters, tabletops, doorknobs, bathroom fixtures, toilets, phones, keyboards, tablets, and bedside tables, every day. Also, clean any surfaces that may have blood, body fluids, and/or secretions or excretions on them.  Wear gloves when cleaning surfaces the patient has come in contact with.  Use a diluted bleach solution (e.g., dilute bleach with 1 part bleach and 10 parts water) or a household disinfectant with a label that says EPA-registered for coronaviruses. To make  a bleach solution at home, add 1 tablespoon of bleach to 1 quart (4 cups) of water. For a larger supply, add  cup of bleach to 1 gallon (16 cups) of  water.  Read labels of cleaning products and follow recommendations provided on product labels. Labels contain instructions for safe and effective use of the cleaning product including precautions you should take when applying the product, such as wearing gloves or eye protection and making sure you have good ventilation during use of the product.  Remove gloves and wash hands immediately after cleaning.  Monitor yourself for signs and symptoms of illness Caregivers and household members are considered close contacts, should monitor their health, and will be asked to limit movement outside of the home to the extent possible. Follow the monitoring steps for close contacts listed on the symptom monitoring form.   ? If you have additional questions, contact your local health department or call the epidemiologist on call at [phone] (available 24/7). ? This guidance is subject to change. For the most up-to-date guidance from [facility], please refer to their website: [email or web address]     ED Prescriptions    None     PDMP not reviewed this encounter.   Janith Lima, [place] 09/15/19 [id number]

## 2019-09-15 NOTE — ED Triage Notes (Signed)
Pt requests COVID test 2/2 living with sister who was exposed to corona "outbreak" at work and is awaiting test results. Pt c/o chronic cough. Denies sore throat, fever, n/v/d, abd pain, or body aches.

## 2019-09-18 LAB — NOVEL CORONAVIRUS, NAA (HOSP ORDER, SEND-OUT TO REF LAB; TAT 18-24 HRS): SARS-CoV-2, NAA: NOT DETECTED

## 2019-09-23 ENCOUNTER — Encounter: Payer: Self-pay | Admitting: Family Medicine

## 2019-09-23 ENCOUNTER — Ambulatory Visit (INDEPENDENT_AMBULATORY_CARE_PROVIDER_SITE_OTHER): Payer: Self-pay | Admitting: Family Medicine

## 2019-09-23 ENCOUNTER — Other Ambulatory Visit: Payer: Self-pay

## 2019-09-23 VITALS — BP 113/68 | HR 80 | Temp 98.2°F | Ht 65.0 in | Wt 200.2 lb

## 2019-09-23 DIAGNOSIS — R7309 Other abnormal glucose: Secondary | ICD-10-CM

## 2019-09-23 DIAGNOSIS — R739 Hyperglycemia, unspecified: Secondary | ICD-10-CM

## 2019-09-23 DIAGNOSIS — Z794 Long term (current) use of insulin: Secondary | ICD-10-CM

## 2019-09-23 DIAGNOSIS — E119 Type 2 diabetes mellitus without complications: Secondary | ICD-10-CM

## 2019-09-23 DIAGNOSIS — Z09 Encounter for follow-up examination after completed treatment for conditions other than malignant neoplasm: Secondary | ICD-10-CM

## 2019-09-23 LAB — POCT URINALYSIS DIPSTICK
Blood, UA: NEGATIVE
Glucose, UA: POSITIVE — AB
Ketones, UA: 40
Leukocytes, UA: NEGATIVE
Nitrite, UA: NEGATIVE
Protein, UA: NEGATIVE
Spec Grav, UA: >=1.03 — AB (ref 1.010–1.025)
Urobilinogen, UA: 0.2 E.U./dL
pH, UA: 5 (ref 5.0–8.0)

## 2019-09-23 LAB — GLUCOSE, POCT (MANUAL RESULT ENTRY): POC Glucose: 258 mg/dl — AB (ref 70–99)

## 2019-09-23 LAB — POCT GLYCOSYLATED HEMOGLOBIN (HGB A1C): Hemoglobin A1C: 13 % — AB (ref 4.0–5.6)

## 2019-09-23 MED ORDER — LANTUS SOLOSTAR 100 UNIT/ML ~~LOC~~ SOPN: 20.0000 [IU] | PEN_INJECTOR | Freq: Every day | SUBCUTANEOUS | 99 refills | Status: DC

## 2019-09-23 MED FILL — ?BASAGLAR 100 UNITS/ML KWPE: 100 | 30 days supply | Qty: 6 | Fill #0

## 2019-09-23 NOTE — Progress Notes (Signed)
Patient [facility]   Established Patient Office Visit  Subjective:  Patient ID: Dakota Kim, male    DOB: [birth year]  Age: 49 y.o. MRN: [id number]  CC:  Chief Complaint  Patient presents with  . Follow-up    53mh follow up DM    HPI Dakota Kim a 49year old male presents for Follow Up today.   Past Medical History:  Diagnosis Date  . Diabetes mellitus without complication ([place]    Current Status: Since his last office visit, his is in the process of moving and does not know where his blood glucose meter is located at this time. His anxiety is moderated today. He denies suicidal ideations, homicidal ideations, or auditory hallucinations. He denies fatigue, frequent urination, blurred vision, excessive hunger, excessive thirst, weight gain, weight loss, and poor wound healing. He continues to check his feet regularly. He denies fevers, chills, recent infections, weight loss, and night sweats. He has not had any headaches, visual changes, dizziness, and falls. No chest pain, heart palpitations, cough and shortness of breath reported. No reports of GI problems such as nausea, vomiting, diarrhea, and constipation. He has no reports of blood in stools, dysuria and hematuria. He denies pain today.   History reviewed. No pertinent surgical history.  Family History  Problem Relation Age of Onset  . Diabetes Mellitus II Father     Social History   Socioeconomic History  . Marital status: Single    Spouse name: Not on file  . Number of children: Not on file  . Years of education: Not on file  . Highest education level: Not on file  Occupational History  . Not on file  Tobacco Use  . Smoking status: Current Every Day Smoker    Last attempt to quit: 10/08/2012    Years since quitting: 6.9  . Smokeless tobacco: Never Used  Substance and Sexual Activity  . Alcohol use: Yes    Comment: occassionally  . Drug use: No  . Sexual  activity: Not Currently  Other Topics Concern  . Not on file  Social History Narrative  . Not on file   Social Determinants of Health   Financial Resource Strain:   . Difficulty of Paying Living Expenses: Not on file  Food Insecurity:   . Worried About RCharity fundraiserin the Last Year: Not on file  . Ran Out of Food in the Last Year: Not on file  Transportation Needs:   . Lack of Transportation (Medical): Not on file  . Lack of Transportation (Non-Medical): Not on file  Physical Activity:   . Days of Exercise per Week: Not on file  . Minutes of Exercise per Session: Not on file  Stress:   . Feeling of Stress : Not on file  Social Connections:   . Frequency of Communication with Friends and Family: Not on file  . Frequency of Social Gatherings with Friends and Family: Not on file  . Attends Religious Services: Not on file  . Active Member of Clubs or Organizations: Not on file  . Attends CArchivistMeetings: Not on file  . Marital Status: Not on file  Intimate Partner Violence:   . Fear of Current or Ex-Partner: Not on file  . Emotionally Abused: Not on file  . Physically Abused: Not on file  . Sexually Abused: Not on file    Outpatient Medications Prior to Visit  Medication Sig Dispense Refill  . blood  glucose meter kit and supplies KIT Dispense based on patient and insurance preference. Check blood sugar up to four times daily or as needed. ICD 10 E11.9 1 each 0  . glipiZIDE (GLUCOTROL) 10 MG tablet Take 1 tablet (10 mg total) by mouth 2 (two) times daily before a meal. 60 tablet 3  . Insulin Lispro Prot & Lispro (HUMALOG MIX 75/25 KWIKPEN) (75-25) 100 UNIT/ML Kwikpen Inject 45 Units into the skin 2 (two) times daily with breakfast and lunch. 15 mL 11  . ferrous sulfate 325 (65 FE) MG tablet Take 1 tablet (325 mg total) by mouth daily. (Patient not taking: Reported on 09/23/2019) 30 tablet 3   No facility-administered medications prior to visit.    No Known  Allergies  ROS Review of Systems  Constitutional: Negative.   HENT: Negative.   Eyes: Negative.   Respiratory: Negative.   Cardiovascular: Negative.   Gastrointestinal: Negative.   Endocrine: Positive for polydipsia, polyphagia and polyuria.  Genitourinary: Negative.   Musculoskeletal: Negative.   Skin: Negative.   Allergic/Immunologic: Negative.   Neurological: Negative.   Hematological: Negative.   Psychiatric/Behavioral: Negative.       Objective:    Physical Exam  Constitutional: He is oriented to person, place, and time. He appears well-developed and well-nourished.  HENT:  Head: Normocephalic and atraumatic.  Eyes: Conjunctivae are normal.  Cardiovascular: Normal rate, regular rhythm and normal heart sounds.  Pulmonary/Chest: Effort normal and breath sounds normal.  Abdominal: Soft. Bowel sounds are normal. He exhibits distension (obese).  Musculoskeletal:        General: Normal range of motion.     Cervical back: Normal range of motion and neck supple.  Neurological: He is alert and oriented to person, place, and time. He has normal reflexes.  Skin: Skin is warm and dry.  Psychiatric: His behavior is normal. Judgment and thought content normal.  Increased anxiety today.   Nursing note and vitals reviewed.   BP 113/68   Pulse 80   Temp 98.2 F (36.8 C) (Oral)   Ht '5\' 5"'  (1.651 m)   Wt 200 lb 3.2 oz (90.8 kg)   SpO2 96%   BMI 33.32 kg/m  Wt Readings from Last 3 Encounters:  09/23/19 200 lb 3.2 oz (90.8 kg)  02/24/19 213 lb (96.6 kg)  08/23/18 215 lb (97.5 kg)     Health Maintenance Due  Topic Date Due  . PNEUMOCOCCAL POLYSACCHARIDE VACCINE AGE 44-64 HIGH RISK  09/09/1972  . OPHTHALMOLOGY EXAM  09/09/1980  . HIV Screening  09/09/1985  . TETANUS/TDAP  09/09/1989  . INFLUENZA VACCINE  04/12/2019  . FOOT EXAM  08/24/2019  . URINE MICROALBUMIN  08/24/2019    There are no preventive care reminders to display for this patient.  Lab Results    Component Value Date   TSH 1.020 08/23/2018   Lab Results  Component Value Date   WBC 7.7 08/23/2018   HGB 15.6 08/23/2018   HCT 49.2 08/23/2018   MCV 90 08/23/2018   PLT 227 08/23/2018   Lab Results  Component Value Date   NA 139 08/23/2018   K 4.0 08/23/2018   CO2 22 08/23/2018   GLUCOSE 187 (H) 08/23/2018   BUN 12 08/23/2018   CREATININE 0.94 08/23/2018   BILITOT 0.4 08/23/2018   ALKPHOS 79 08/23/2018   AST 15 08/23/2018   ALT 23 08/23/2018   PROT 6.8 08/23/2018   ALBUMIN 4.3 08/23/2018   CALCIUM 9.0 08/23/2018   ANIONGAP 10 04/03/2017  Lab Results  Component Value Date   CHOL 148 08/23/2018   Lab Results  Component Value Date   HDL 42 08/23/2018   Lab Results  Component Value Date   LDLCALC 74 08/23/2018   Lab Results  Component Value Date   TRIG 160 (H) 08/23/2018   Lab Results  Component Value Date   CHOLHDL 3.5 08/23/2018   Lab Results  Component Value Date   HGBA1C 13.0 (A) 09/23/2019    Assessment & Plan:   1. Type 2 diabetes mellitus without complication, with long-term current use of insulin ([place]) He will continue medication as prescribed, to decrease foods/beverages high in sugars and carbs and follow Heart Healthy or DASH diet. Increase physical activity to at least 30 minutes cardio exercise daily.  - Urinalysis Dipstick - POCT HgB A1C - Glucose (CBG) - CBC with Differential - Comp Met (CMET) - Lipid Panel - TSH - Vitamin B12 - Vitamin D, 25-hydroxy - Insulin Glargine (LANTUS SOLOSTAR) 100 UNIT/ML Solostar Pen; Inject 20 Units into the skin at bedtime.  Dispense: 5 pen; Refill: PRN  2. Hemoglobin A1C greater than 9%, indicating poor diabetic control Hgb A1c increased at 13.0, from 10.9 on 02/28/2019.   3. Hyperglycemia Blood glucose at 258 today. Mild.   4. Follow up He will follow up in 3 months.   Meds ordered this encounter  Medications  . Insulin Glargine (LANTUS SOLOSTAR) 100 UNIT/ML Solostar Pen    Sig: Inject 20  Units into the skin at bedtime.    Dispense:  5 pen    Refill:  PRN    Orders Placed This Encounter  Procedures  . CBC with Differential  . Comp Met (CMET)  . Lipid Panel  . TSH  . Vitamin B12  . Vitamin D, 25-hydroxy  . Urinalysis Dipstick  . POCT HgB A1C  . Glucose (CBG)    Referral Orders  No referral(s) requested today    Kathe Becton,  MSN, FNP-BC [facility] [address] [place], [place] [address] [phone]- fax  Problem List Items Addressed This Visit      Endocrine   Type 2 diabetes mellitus without complication, with long-term current use of insulin ([place]) - Primary   Relevant Medications   Insulin Glargine (LANTUS SOLOSTAR) 100 UNIT/ML Solostar Pen   Other Relevant Orders   Urinalysis Dipstick (Completed)   POCT HgB A1C (Completed)   Glucose (CBG) (Completed)   CBC with Differential   Comp Met (CMET)   Lipid Panel   TSH   Vitamin B12   Vitamin D, 25-hydroxy    Other Visit Diagnoses    Hemoglobin A1C greater than 9%, indicating poor diabetic control       Relevant Medications   Insulin Glargine (LANTUS SOLOSTAR) 100 UNIT/ML Solostar Pen   Hyperglycemia       Follow up          Meds ordered this encounter  Medications  . Insulin Glargine (LANTUS SOLOSTAR) 100 UNIT/ML Solostar Pen    Sig: Inject 20 Units into the skin at bedtime.    Dispense:  5 pen    Refill:  PRN    Follow-up: Return in about 3 months (around 12/22/2019).    Azzie Glatter, FNP

## 2019-09-24 DIAGNOSIS — R739 Hyperglycemia, unspecified: Secondary | ICD-10-CM | POA: Insufficient documentation

## 2019-09-24 DIAGNOSIS — R7309 Other abnormal glucose: Secondary | ICD-10-CM | POA: Insufficient documentation

## 2019-09-24 LAB — CBC WITH DIFFERENTIAL/PLATELET
Basophils Absolute: 0 10*3/uL (ref 0.0–0.2)
Basos: 0 %
EOS (ABSOLUTE): 0 10*3/uL (ref 0.0–0.4)
Eos: 0 %
Hematocrit: 46.5 % (ref 37.5–51.0)
Hemoglobin: 15.3 g/dL (ref 13.0–17.7)
Immature Grans (Abs): 0 10*3/uL (ref 0.0–0.1)
Immature Granulocytes: 0 %
Lymphocytes Absolute: 1.7 10*3/uL (ref 0.7–3.1)
Lymphs: 19 %
MCH: 29.3 pg (ref 26.6–33.0)
MCHC: 32.9 g/dL (ref 31.5–35.7)
MCV: 89 fL (ref 79–97)
Monocytes Absolute: 0.6 10*3/uL (ref 0.1–0.9)
Monocytes: 7 %
Neutrophils Absolute: 6.5 10*3/uL (ref 1.4–7.0)
Neutrophils: 74 %
Platelets: 245 10*3/uL (ref 150–450)
RBC: 5.22 x10E6/uL (ref 4.14–5.80)
RDW: 12.9 % (ref 11.6–15.4)
WBC: 8.9 10*3/uL (ref 3.4–10.8)

## 2019-09-24 LAB — COMPREHENSIVE METABOLIC PANEL
ALT: 14 IU/L (ref 0–44)
AST: 10 IU/L (ref 0–40)
Albumin/Globulin Ratio: 1.4 (ref 1.2–2.2)
Albumin: 4.3 g/dL (ref 4.0–5.0)
Alkaline Phosphatase: 90 IU/L (ref 39–117)
BUN/Creatinine Ratio: 10 (ref 9–20)
BUN: 9 mg/dL (ref 6–24)
Bilirubin Total: 0.8 mg/dL (ref 0.0–1.2)
CO2: 19 mmol/L — ABNORMAL LOW (ref 20–29)
Calcium: 8.9 mg/dL (ref 8.7–10.2)
Chloride: 98 mmol/L (ref 96–106)
Creatinine, Ser: 0.86 mg/dL (ref 0.76–1.27)
GFR calc Af Amer: 118 mL/min/{1.73_m2} (ref >59)
GFR calc non Af Amer: 102 mL/min/{1.73_m2} (ref >59)
Globulin, Total: 3 g/dL (ref 1.5–4.5)
Glucose: 250 mg/dL — ABNORMAL HIGH (ref 65–99)
Potassium: 4.5 mmol/L (ref 3.5–5.2)
Sodium: 137 mmol/L (ref 134–144)
Total Protein: 7.3 g/dL (ref 6.0–8.5)

## 2019-09-24 LAB — LIPID PANEL
Chol/HDL Ratio: 3.4 ratio (ref 0.0–5.0)
Cholesterol, Total: 150 mg/dL (ref 100–199)
HDL: 44 mg/dL (ref >39)
LDL Chol Calc (NIH): 87 mg/dL (ref 0–99)
Triglycerides: 105 mg/dL (ref 0–149)
VLDL Cholesterol Cal: 19 mg/dL (ref 5–40)

## 2019-09-24 LAB — VITAMIN D 25 HYDROXY (VIT D DEFICIENCY, FRACTURES): Vit D, 25-Hydroxy: 14.5 ng/mL — ABNORMAL LOW (ref 30.0–100.0)

## 2019-09-24 LAB — VITAMIN B12: Vitamin B-12: 701 pg/mL (ref 232–1245)

## 2019-09-24 LAB — TSH: TSH: 0.742 u[IU]/mL (ref 0.450–4.500)

## 2019-09-29 ENCOUNTER — Other Ambulatory Visit: Payer: Self-pay | Admitting: Family Medicine

## 2019-09-29 ENCOUNTER — Encounter: Payer: Self-pay | Admitting: Family Medicine

## 2019-09-29 DIAGNOSIS — E785 Hyperlipidemia, unspecified: Secondary | ICD-10-CM

## 2019-09-29 DIAGNOSIS — E559 Vitamin D deficiency, unspecified: Secondary | ICD-10-CM

## 2019-09-29 DIAGNOSIS — E119 Type 2 diabetes mellitus without complications: Secondary | ICD-10-CM

## 2019-09-29 DIAGNOSIS — Z794 Long term (current) use of insulin: Secondary | ICD-10-CM

## 2019-09-29 DIAGNOSIS — F419 Anxiety disorder, unspecified: Secondary | ICD-10-CM

## 2019-09-29 MED ORDER — INSULIN LISPRO PROT & LISPRO (75-25 MIX) 100 UNIT/ML KWIKPEN: 45.0000 [IU] | PEN_INJECTOR | Freq: Two times a day (BID) | SUBCUTANEOUS | 11 refills | Status: DC

## 2019-09-29 MED ORDER — GLIPIZIDE 10 MG PO TABS: 10.0000 mg | ORAL_TABLET | Freq: Two times a day (BID) | ORAL | 3 refills | Status: DC

## 2019-09-29 MED ORDER — VITAMIN D (ERGOCALCIFEROL) 1.25 MG (50000 UNIT) PO CAPS: 50000.0000 [IU] | ORAL_CAPSULE | ORAL | 3 refills | Status: DC

## 2019-09-29 MED ORDER — GLIMEPIRIDE 4 MG PO TABS: 4.0000 mg | ORAL_TABLET | Freq: Every day | ORAL | 3 refills | Status: DC

## 2019-09-29 MED ORDER — ATORVASTATIN CALCIUM 10 MG PO TABS: 10.0000 mg | ORAL_TABLET | Freq: Every day | ORAL | 3 refills | Status: DC

## 2019-09-29 MED ORDER — BUSPIRONE HCL 5 MG PO TABS: 5.0000 mg | ORAL_TABLET | Freq: Two times a day (BID) | ORAL | 3 refills | Status: DC

## 2019-09-29 MED FILL — busPIRone HCL 5 MG TABS: 5 | 30 days supply | Qty: 60 | Fill #0

## 2019-09-29 MED FILL — ?GLIPIZIDE 10 MG TABLET: 10 | 30 days supply | Qty: 60 | Fill #0

## 2019-09-29 MED FILL — ?ATORVASTATIN 10 MG TABLET: 10 | 30 days supply | Qty: 30 | Fill #0

## 2019-09-29 MED FILL — ?HUMALOG MIX 75-25 KWIKPEN: (75-25) 100 | 30 days supply | Qty: 27 | Fill #0

## 2019-09-29 MED FILL — GLIMEPIRIDE 4 MG TABS: 4 | 30 days supply | Qty: 30 | Fill #0

## 2019-09-29 MED FILL — ?ERGOCALCIFEROL 50000 UNITC: 1.25 MG | 35 days supply | Qty: 5 | Fill #0

## 2019-11-03 ENCOUNTER — Other Ambulatory Visit: Payer: Self-pay

## 2019-11-03 ENCOUNTER — Encounter (HOSPITAL_COMMUNITY): Payer: Self-pay | Admitting: Emergency Medicine

## 2019-11-03 ENCOUNTER — Emergency Department (HOSPITAL_COMMUNITY)
Admission: EM | Admit: 2019-11-03 | Discharge: 2019-11-03 | Disposition: A | Discharge status: Home / Self Care | Payer: Self-pay | Attending: Emergency Medicine | Admitting: Emergency Medicine

## 2019-11-03 DIAGNOSIS — F17210 Nicotine dependence, cigarettes, uncomplicated: Secondary | ICD-10-CM | POA: Insufficient documentation

## 2019-11-03 DIAGNOSIS — R739 Hyperglycemia, unspecified: Secondary | ICD-10-CM

## 2019-11-03 DIAGNOSIS — Z79899 Other long term (current) drug therapy: Secondary | ICD-10-CM | POA: Insufficient documentation

## 2019-11-03 DIAGNOSIS — R350 Frequency of micturition: Secondary | ICD-10-CM | POA: Insufficient documentation

## 2019-11-03 DIAGNOSIS — Z794 Long term (current) use of insulin: Secondary | ICD-10-CM | POA: Insufficient documentation

## 2019-11-03 DIAGNOSIS — E1165 Type 2 diabetes mellitus with hyperglycemia: Secondary | ICD-10-CM | POA: Insufficient documentation

## 2019-11-03 LAB — URINALYSIS, ROUTINE W REFLEX MICROSCOPIC
Bacteria, UA: NONE SEEN
Bilirubin Urine: NEGATIVE
Glucose, UA: >=500 mg/dL — AB
Hgb urine dipstick: NEGATIVE
Ketones, ur: NEGATIVE mg/dL
Leukocytes,Ua: NEGATIVE
Nitrite: NEGATIVE
Protein, ur: NEGATIVE mg/dL
Specific Gravity, Urine: 1.028 (ref 1.005–1.030)
pH: 5 (ref 5.0–8.0)

## 2019-11-03 LAB — BASIC METABOLIC PANEL
Anion gap: 12 (ref 5–15)
BUN: 15 mg/dL (ref 6–20)
CO2: 21 mmol/L — ABNORMAL LOW (ref 22–32)
Calcium: 8.9 mg/dL (ref 8.9–10.3)
Chloride: 100 mmol/L (ref 98–111)
Creatinine, Ser: 1.2 mg/dL (ref 0.61–1.24)
GFR calc Af Amer: >60 mL/min (ref >60)
GFR calc non Af Amer: >60 mL/min (ref >60)
Glucose, Bld: 660 mg/dL (ref 70–99)
Potassium: 4.1 mmol/L (ref 3.5–5.1)
Sodium: 133 mmol/L — ABNORMAL LOW (ref 135–145)

## 2019-11-03 LAB — CBC
HCT: 48.7 % (ref 39.0–52.0)
Hemoglobin: 15.6 g/dL (ref 13.0–17.0)
MCH: 29.3 pg (ref 26.0–34.0)
MCHC: 32 g/dL (ref 30.0–36.0)
MCV: 91.5 fL (ref 80.0–100.0)
Platelets: 261 10*3/uL (ref 150–400)
RBC: 5.32 MIL/uL (ref 4.22–5.81)
RDW: 13.2 % (ref 11.5–15.5)
WBC: 8.1 10*3/uL (ref 4.0–10.5)
nRBC: 0 % (ref 0.0–0.2)

## 2019-11-03 LAB — CBG MONITORING, ED
Glucose-Capillary: >600 mg/dL (ref 70–99)
Glucose-Capillary: 113 mg/dL — ABNORMAL HIGH (ref 70–99)

## 2019-11-03 MED ORDER — SODIUM CHLORIDE 0.9 % IV BOLUS
1000.0000 mL | Freq: Once | INTRAVENOUS | Status: AC
  Administered 2019-11-03: 1000 mL via INTRAVENOUS

## 2019-11-03 MED ORDER — SODIUM CHLORIDE 0.9 % IV BOLUS
1000.0000 mL | Freq: Once | INTRAVENOUS | Status: AC
  Administered 2019-11-03: 16:00:00 1000 mL via INTRAVENOUS

## 2019-11-03 MED ORDER — INSULIN ASPART 100 UNIT/ML ~~LOC~~ SOLN
10.0000 [IU] | Freq: Once | SUBCUTANEOUS | Status: AC
  Administered 2019-11-03: 10 [IU] via INTRAVENOUS
  Filled 2019-11-03: qty 0.1

## 2019-11-03 NOTE — ED Provider Notes (Signed)
[facility] DEPT Provider Note   CSN: [id number] Arrival date & time: 11/03/19  1406     History Chief Complaint  Patient presents with  . Fatigue  . Urinary Frequency    Dakota Kim is a 49 y.o. male with insulin dependent Type 2 DM who presents with malaise and urinary frequency. He states that yesterday he started to feel bad. He noticed that he was very thirsty and started to have polyuria. He has been working a lot and has had dietary indiscretions. He reports compliance with his insulin. He continued to feel bad today and thought maybe his sugar was elevated so came to the ED. He denies any infectious symptoms. No fever, headache, URI symptoms, chest pain, SOB, cough, abdominal pain, N/V/D, dysuria. His last A1C was 13. He does not have a way to check his glucose at home because his meter broke and the replacement meter was too expensive.  HPI     Past Medical History:  Diagnosis Date  . Diabetes mellitus without complication ([place])   . Vitamin D deficiency 09/2019    Patient Active Problem List   Diagnosis Date Noted  . Hyperglycemia 09/24/2019  . Hemoglobin A1C greater than 9%, indicating poor diabetic control 09/24/2019  . Hyperlipidemia 02/26/2019  . Class 2 severe obesity due to excess calories with serious comorbidity and body mass index (BMI) of 35.0 to 35.9 in adult ([place]) 02/26/2019  . Type 2 diabetes mellitus without complication, with long-term current use of insulin ([place]) 05/13/2017  . DKA (diabetic ketoacidoses) ([place]) 11/06/2012  . Hypokalemia 11/06/2012    History reviewed. No pertinent surgical history.     Family History  Problem Relation Age of Onset  . Diabetes Mellitus II Father     Social History   Tobacco Use  . Smoking status: Current Every Day Smoker    Last attempt to quit: 10/08/2012    Years since quitting: 7.0  . Smokeless tobacco: Never Used  Substance Use Topics  . Alcohol use: Yes    Comment:  occassionally  . Drug use: No    Home Medications Prior to Admission medications   Medication Sig Start Date End Date Taking? Authorizing Provider  atorvastatin (LIPITOR) 10 MG tablet Take 1 tablet (10 mg total) by mouth daily. 09/29/19 01/27/20  Azzie Glatter, FNP  blood glucose meter kit and supplies KIT Dispense based on patient and insurance preference. Check blood sugar up to four times daily or as needed. ICD 10 E11.9 04/10/17   Scot Jun, FNP  busPIRone (BUSPAR) 5 MG tablet Take 1 tablet (5 mg total) by mouth 2 (two) times daily. 09/29/19   Azzie Glatter, FNP  ferrous sulfate 325 (65 FE) MG tablet Take 1 tablet (325 mg total) by mouth daily. Patient not taking: Reported on 09/23/2019 08/28/18 12/26/18  Azzie Glatter, FNP  glimepiride (AMARYL) 4 MG tablet Take 1 tablet (4 mg total) by mouth daily with breakfast. 09/29/19   Azzie Glatter, FNP  glipiZIDE (GLUCOTROL) 10 MG tablet Take 1 tablet (10 mg total) by mouth 2 (two) times daily before a meal. 09/29/19   Azzie Glatter, FNP  Insulin Glargine (LANTUS SOLOSTAR) 100 UNIT/ML Solostar Pen Inject 20 Units into the skin at bedtime. 09/23/19   Azzie Glatter, FNP  Insulin Lispro Prot & Lispro (HUMALOG MIX 75/25 KWIKPEN) (75-25) 100 UNIT/ML Kwikpen Inject 45 Units into the skin 2 (two) times daily with breakfast and lunch. 09/29/19   Kathe Becton  M, FNP  Vitamin D, Ergocalciferol, (DRISDOL) 1.25 MG (50000 UNIT) CAPS capsule Take 1 capsule (50,000 Units total) by mouth every 7 (seven) days. 09/29/19   Azzie Glatter, FNP    Allergies    Patient has no known allergies.  Review of Systems   Review of Systems  Constitutional: Negative for fever.  Respiratory: Negative for shortness of breath.   Cardiovascular: Negative for chest pain.  Gastrointestinal: Negative for abdominal pain.  Endocrine: Positive for polydipsia, polyphagia and polyuria.  Genitourinary: Positive for frequency.  All other systems reviewed and  are negative.   Physical Exam Updated Vital Signs BP (!) 144/104   Pulse 84   Temp 98.2 F (36.8 C) (Oral)   Resp (!) 21   SpO2 95%   Physical Exam Vitals and nursing note reviewed.  Constitutional:      General: He is not in acute distress.    Appearance: He is well-developed. He is not ill-appearing.  HENT:     Head: Normocephalic and atraumatic.  Eyes:     General: No scleral icterus.       Right eye: No discharge.        Left eye: No discharge.     Conjunctiva/sclera: Conjunctivae normal.     Pupils: Pupils are equal, round, and reactive to light.  Cardiovascular:     Rate and Rhythm: Normal rate and regular rhythm.  Pulmonary:     Effort: Pulmonary effort is normal. No respiratory distress.     Breath sounds: Normal breath sounds.  Abdominal:     General: There is no distension.     Palpations: Abdomen is soft.     Tenderness: There is no abdominal tenderness.  Musculoskeletal:     Cervical back: Normal range of motion.  Skin:    General: Skin is warm and dry.  Neurological:     Mental Status: He is alert and oriented to person, place, and time.  Psychiatric:        Behavior: Behavior normal.     ED Results / Procedures / Treatments   Labs (all labs ordered are listed, but only abnormal results are displayed) Labs Reviewed  BASIC METABOLIC PANEL - Abnormal; Notable for the following components:      Result Value   Sodium 133 (*)    CO2 21 (*)    Glucose, Bld 660 (*)    All other components within normal limits  URINALYSIS, ROUTINE W REFLEX MICROSCOPIC - Abnormal; Notable for the following components:   Color, Urine STRAW (*)    Glucose, UA >=500 (*)    All other components within normal limits  CBG MONITORING, ED - Abnormal; Notable for the following components:   Glucose-Capillary >600 (*)    All other components within normal limits  CBG MONITORING, ED - Abnormal; Notable for the following components:   Glucose-Capillary 113 (*)    All other  components within normal limits  CBC  CBG MONITORING, ED    EKG None  Radiology No results found.  Procedures Procedures (including critical care time)  Medications Ordered in ED Medications  sodium chloride 0.9 % bolus 1,000 mL (0 mLs Intravenous Stopped 11/03/19 1613)  insulin aspart (novoLOG) injection 10 Units (10 Units Intravenous Given 11/03/19 1614)  sodium chloride 0.9 % bolus 1,000 mL (1,000 mLs Intravenous New Bag/Given 11/03/19 1618)    ED Course  I have reviewed the triage vital signs and the nursing notes.  Pertinent labs & imaging results that were available during  my care of the patient were reviewed by me and considered in my medical decision making (see chart for details).  49 year old male who presents with malaise and urinary frequency.  CBG here is reading over 600.  His vital signs are normal.  He is not ill-appearing.  Mental status is clear.  Heart is regular rate and rhythm.  Lungs are clear to auscultation.  Abdomen is soft and nontender.  Low suspicion for diabetic emergency such as DKA or HHS.  CBC is normal.  BMP is remarkable for glucose of 660, bicarb is 21.  Anion gap is normal.  Creatinine is mildly elevated from baseline.  Will give fluids and insulin  On recheck CBGs 113.  He feels better.  He is advised to call his PCP to discuss insulin dosage and to see if he can get a cheaper meter.  He was advised to return if worsening.  MDM Rules/Calculators/A&P                       Final Clinical Impression(s) / ED Diagnoses Final diagnoses:  Hyperglycemia    Rx / DC Orders ED Discharge Orders    None       Recardo Evangelist, PA-C 11/03/19 1847    Daleen Bo, MD 11/04/19 [phone]

## 2019-11-03 NOTE — ED Triage Notes (Signed)
Pt c/o neck and back pain and urinary frequency that started yesterday at work. Reports that his sugar is probably high. hasnt been able to check his sugar levels due to broken glucometer.

## 2019-11-03 NOTE — ED Notes (Signed)
Date and time results received: 11/03/19 3:06 PM  (use smartphrase ".now" to insert current time)  Test: Glucose Critical Value: 660  Name of Provider Notified: Rayfield Citizen RN  Orders Received? Or Actions Taken?: Waiting for provider

## 2019-11-03 NOTE — Discharge Instructions (Signed)
Please follow up with your PCP to discuss your insulin regimen and getting a different glucose meter Return if you are worsening

## 2019-12-22 ENCOUNTER — Ambulatory Visit: Payer: Self-pay | Admitting: Family Medicine

## 2020-01-05 MED FILL — ?BASAGLAR 100 UNITS/ML KWPE: 100 | 30 days supply | Qty: 6 | Fill #1

## 2020-01-05 MED FILL — ?HUMALOG MIX 75-25 KWIKPEN: (75-25) 100 | 30 days supply | Qty: 27 | Fill #1

## 2020-01-05 MED FILL — ?ERGOCALCIFEROL 50000 UNITC: 1.25 MG | 35 days supply | Qty: 5 | Fill #1

## 2020-03-08 MED FILL — ?BASAGLAR 100 UNITS/ML KWPE: 100 | 30 days supply | Qty: 6 | Fill #2

## 2020-03-08 MED FILL — ?HUMALOG MIX 75-25 KWIKPEN: (75-25) 100 | 30 days supply | Qty: 27 | Fill #2

## 2020-03-27 ENCOUNTER — Emergency Department (HOSPITAL_COMMUNITY)
Admission: EM | Admit: 2020-03-27 | Discharge: 2020-03-27 | Disposition: A | Discharge status: Home / Self Care | Payer: Self-pay | Attending: Emergency Medicine | Admitting: Emergency Medicine

## 2020-03-27 ENCOUNTER — Emergency Department (HOSPITAL_COMMUNITY): Payer: Self-pay

## 2020-03-27 ENCOUNTER — Encounter (HOSPITAL_COMMUNITY): Payer: Self-pay

## 2020-03-27 DIAGNOSIS — Y9289 Other specified places as the place of occurrence of the external cause: Secondary | ICD-10-CM | POA: Insufficient documentation

## 2020-03-27 DIAGNOSIS — F17210 Nicotine dependence, cigarettes, uncomplicated: Secondary | ICD-10-CM | POA: Insufficient documentation

## 2020-03-27 DIAGNOSIS — Y9301 Activity, walking, marching and hiking: Secondary | ICD-10-CM | POA: Insufficient documentation

## 2020-03-27 DIAGNOSIS — S93402A Sprain of unspecified ligament of left ankle, initial encounter: Secondary | ICD-10-CM | POA: Insufficient documentation

## 2020-03-27 DIAGNOSIS — Y998 Other external cause status: Secondary | ICD-10-CM | POA: Insufficient documentation

## 2020-03-27 DIAGNOSIS — W108XXA Fall (on) (from) other stairs and steps, initial encounter: Secondary | ICD-10-CM | POA: Insufficient documentation

## 2020-03-27 DIAGNOSIS — Z794 Long term (current) use of insulin: Secondary | ICD-10-CM | POA: Insufficient documentation

## 2020-03-27 DIAGNOSIS — E1110 Type 2 diabetes mellitus with ketoacidosis without coma: Secondary | ICD-10-CM | POA: Insufficient documentation

## 2020-03-27 LAB — CBG MONITORING, ED: Glucose-Capillary: 320 mg/dL — ABNORMAL HIGH (ref 70–99)

## 2020-03-27 MED ORDER — NAPROXEN 500 MG PO TABS: 500.0000 mg | ORAL_TABLET | Freq: Two times a day (BID) | ORAL | 0 refills | Status: DC

## 2020-03-27 MED ORDER — KETOROLAC TROMETHAMINE 60 MG/2ML IM SOLN
30.0000 mg | Freq: Once | INTRAMUSCULAR | Status: AC
  Administered 2020-03-27: 30 mg via INTRAMUSCULAR
  Filled 2020-03-27: qty 2

## 2020-03-27 NOTE — Discharge Instructions (Addendum)
You are seen today for an ankle injury.  Your x-ray showed no fracture, dislocation or break.  You have an ankle sprain.  You may bear weight as tolerated.  Rest and elevate your ankle as much as possible.  Apply ice 20 minutes at a time, 3 times a day.  Follow-up with orthopedics if no improvement.

## 2020-03-27 NOTE — ED Provider Notes (Signed)
[facility] EMERGENCY DEPARTMENT Provider Note   CSN: [id number] Arrival date & time: 03/27/20  0701     History Chief Complaint  Patient presents with   Ankle Pain    Dakota Kim is a 49 y.o. male.  Patient is a 49 year old gentleman who has past medical history of diabetes, hyperlipidemia presenting to the emergency department for left ankle pain.  Patient reports around midnight he was walking down some stairs and tripped and inverted his left ankle.  He has been able to ambulate with a cane since then but reports lateral ankle pain and swelling.  Has not tried anything for relief.        Past Medical History:  Diagnosis Date   Diabetes mellitus without complication ([place])    Vitamin D deficiency 09/2019    Patient Active Problem List   Diagnosis Date Noted   Hyperglycemia 09/24/2019   Hemoglobin A1C greater than 9%, indicating poor diabetic control 09/24/2019   Hyperlipidemia 02/26/2019   Class 2 severe obesity due to excess calories with serious comorbidity and body mass index (BMI) of 35.0 to 35.9 in adult [facility]) 02/26/2019   Type 2 diabetes mellitus without complication, with long-term current use of insulin ([place]) 05/13/2017   DKA (diabetic ketoacidoses) ([place]) 11/06/2012   Hypokalemia 11/06/2012    History reviewed. No pertinent surgical history.     Family History  Problem Relation Age of Onset   Diabetes Mellitus II Father     Social History   Tobacco Use   Smoking status: Current Every Day Smoker    Last attempt to quit: 10/08/2012    Years since quitting: 7.4   Smokeless tobacco: Never Used  Vaping Use   Vaping Use: Never used  Substance Use Topics   Alcohol use: Yes    Comment: occassionally   Drug use: No    Home Medications Prior to Admission medications   Medication Sig Start Date End Date Taking? Authorizing Provider  atorvastatin (LIPITOR) 10 MG tablet Take 1 tablet (10 mg total) by mouth daily.  09/29/19 01/27/20  Azzie Glatter, FNP  blood glucose meter kit and supplies KIT Dispense based on patient and insurance preference. Check blood sugar up to four times daily or as needed. ICD 10 E11.9 04/10/17   Scot Jun, FNP  busPIRone (BUSPAR) 5 MG tablet Take 1 tablet (5 mg total) by mouth 2 (two) times daily. 09/29/19   Azzie Glatter, FNP  ferrous sulfate 325 (65 FE) MG tablet Take 1 tablet (325 mg total) by mouth daily. Patient not taking: Reported on 09/23/2019 08/28/18 12/26/18  Azzie Glatter, FNP  glimepiride (AMARYL) 4 MG tablet Take 1 tablet (4 mg total) by mouth daily with breakfast. 09/29/19   Azzie Glatter, FNP  glipiZIDE (GLUCOTROL) 10 MG tablet Take 1 tablet (10 mg total) by mouth 2 (two) times daily before a meal. 09/29/19   Azzie Glatter, FNP  Insulin Glargine (LANTUS SOLOSTAR) 100 UNIT/ML Solostar Pen Inject 20 Units into the skin at bedtime. 09/23/19   Azzie Glatter, FNP  Insulin Lispro Prot & Lispro (HUMALOG MIX 75/25 KWIKPEN) (75-25) 100 UNIT/ML Kwikpen Inject 45 Units into the skin 2 (two) times daily with breakfast and lunch. 09/29/19   Azzie Glatter, FNP  naproxen (NAPROSYN) 500 MG tablet Take 1 tablet (500 mg total) by mouth 2 (two) times daily. 03/27/20   Alveria Apley, PA-C  Vitamin D, Ergocalciferol, (DRISDOL) 1.25 MG (50000 UNIT) CAPS capsule Take 1  capsule (50,000 Units total) by mouth every 7 (seven) days. 09/29/19   Azzie Glatter, FNP    Allergies    Patient has no known allergies.  Review of Systems   Review of Systems  Constitutional: Negative for chills and fever.  Musculoskeletal: Positive for arthralgias, gait problem and joint swelling.  Neurological: Negative for numbness.  All other systems reviewed and are negative.   Physical Exam Updated Vital Signs BP (!) 134/97 (BP Location: Left Arm)    Pulse 91    Temp 98.4 F (36.9 C) (Oral)    Resp 16    Ht _0  (1.651 m)    Wt 79.4 kg    SpO2 99%    BMI 29.12 kg/m    Physical Exam Vitals and nursing note reviewed.  Constitutional:      Appearance: Normal appearance.  HENT:     Head: Normocephalic.     Mouth/Throat:     Mouth: Mucous membranes are moist.     Pharynx: Oropharynx is clear.  Eyes:     Conjunctiva/sclera: Conjunctivae normal.  Pulmonary:     Effort: Pulmonary effort is normal.  Abdominal:     General: Abdomen is flat.     Palpations: Abdomen is soft.  Musculoskeletal:     Comments: Mild lateral left ankle swelling.  No bony tenderness.  There is tenderness just inferior to the lateral malleolus.  Normal distal pulses, strength, sensation.  Skin:    General: Skin is warm and dry.     Capillary Refill: Capillary refill takes less than 2 seconds.     Findings: No bruising or erythema.  Neurological:     Mental Status: He is alert.     Sensory: No sensory deficit.  Psychiatric:        Mood and Affect: Mood normal.     ED Results / Procedures / Treatments   Labs (all labs ordered are listed, but only abnormal results are displayed) Labs Reviewed  CBG MONITORING, ED - Abnormal; Notable for the following components:      Result Value   Glucose-Capillary 320 (*)    All other components within normal limits    EKG None  Radiology DG Ankle Complete Left  Result Date: 03/27/2020 CLINICAL DATA:  Fall last night EXAM: LEFT ANKLE COMPLETE - 3+ VIEW COMPARISON:  None. FINDINGS: Negative for fracture. Mild spurring of the medial malleolus consistent with old injury. Mild soft tissue swelling. Possible joint effusion. IMPRESSION: Negative for fracture.  Possible joint effusion. Electronically Signed   By: Franchot Gallo M.D.   On: 03/27/2020 07:55    Procedures Procedures (including critical care time)  Medications Ordered in ED Medications  ketorolac (TORADOL) injection 30 mg (has no administration in time range)    ED Course  I have reviewed the triage vital signs and the nursing notes.  Pertinent labs & imaging  results that were available during my care of the patient were reviewed by me and considered in my medical decision making (see chart for details).  Clinical Course as of Mar 28 811  Sat Mar 27, 2020  0809 Patient with left ankle sprain after mechanical trip and fall.  Neurovascularly intact.  X-rays reassuring.  Able to bear some weight with a cane.  Advised rest, ice, compression, elevation.  Will place in an Ace wrap and start anti-inflammatories.  Referred to Ortho in 1 week if no improvement   [KM]    Clinical Course User Index [KM] Alveria Apley, PA-C  MDM Rules/Calculators/A&P                          Based on review of vitals, medical screening exam, lab work and/or imaging, there does not appear to be an acute, emergent etiology for the patient's symptoms. Counseled pt on good return precautions and encouraged both PCP and ED follow-up as needed.  Prior to discharge, I also discussed incidental imaging findings with patient in detail and advised appropriate, recommended follow-up in detail.  Clinical Impression: 1. Sprain of left ankle, unspecified ligament, initial encounter     Disposition: Discharge  Prior to providing a prescription for a controlled substance, I independently reviewed the patient's recent prescription history on the [place]. The patient had no recent or regular prescriptions and was deemed appropriate for a brief, less than 3 day prescription of narcotic for acute analgesia.  This note was prepared with assistance of Systems analyst. Occasional wrong-word or sound-a-like substitutions may have occurred due to the inherent limitations of voice recognition software.  Final Clinical Impression(s) / ED Diagnoses Final diagnoses:  Sprain of left ankle, unspecified ligament, initial encounter    Rx / DC Orders ED Discharge Orders         Ordered    naproxen (NAPROSYN) 500 MG tablet  2 times  daily     Discontinue     03/27/20 0812           Alveria Apley, PA-C 03/27/20 2589    Malvin Johns, MD 03/27/20 1135

## 2020-03-27 NOTE — ED Triage Notes (Signed)
Pt reports that he slipped on some stairs last night and twisted his L ankle. Did not hit head

## 2020-03-27 NOTE — ED Notes (Signed)
Patient verbalizes understanding of discharge instructions . Opportunity for questions and answers were provided . Armband removed by staff ,Pt discharged from ED. W/C  offered at D/C  and Declined W/C at D/C and was escorted to lobby by RN.  

## 2020-05-05 MED FILL — glipiZIDE 10 MG TABS: 10 | 30 days supply | Qty: 60 | Fill #1

## 2020-05-05 MED FILL — VIT D2 1.25 MG (50,000 UNIT: 1.25 MG | 35 days supply | Qty: 5 | Fill #2

## 2020-05-05 MED FILL — ATORVASTATIN 10 MG TABLET: 10 | 30 days supply | Qty: 30 | Fill #1

## 2020-05-05 MED FILL — busPIRone HCL 5 MG TABS: 5 | 30 days supply | Qty: 60 | Fill #1

## 2020-05-14 MED FILL — GLIMEPIRIDE 4 MG TABS: 4 | 30 days supply | Qty: 30 | Fill #1

## 2020-05-14 MED FILL — busPIRone HCL 5 MG TABS: 5 | 30 days supply | Qty: 60 | Fill #1

## 2020-05-14 MED FILL — ?ATORVASTATIN 10 MG TABLET: 10 | 30 days supply | Qty: 30 | Fill #1

## 2020-05-14 MED FILL — ?HUMALOG MIX 75-25 KWIKPEN: (75-25) 100 | 30 days supply | Qty: 27 | Fill #3

## 2020-05-14 MED FILL — ?GLIPIZIDE 10 MG TABLET: 10 | 30 days supply | Qty: 60 | Fill #1

## 2020-05-14 MED FILL — VIT D2 1.25 MG (50,000 UNIT: 1.25 MG | 70 days supply | Qty: 10 | Fill #2

## 2020-05-14 MED FILL — ?BASAGLAR 100 UNITS/ML KWPE: 100 | 30 days supply | Qty: 6 | Fill #3

## 2020-07-06 MED FILL — ?HUMALOG MIX 75-25 KWIKPEN: (75-25) 100 | 30 days supply | Qty: 27 | Fill #4

## 2020-07-28 ENCOUNTER — Encounter (HOSPITAL_COMMUNITY): Payer: Self-pay

## 2020-07-28 ENCOUNTER — Emergency Department (HOSPITAL_COMMUNITY)
Admission: EM | Admit: 2020-07-28 | Discharge: 2020-07-28 | Disposition: A | Discharge status: Home / Self Care | Payer: Self-pay | Attending: Emergency Medicine | Admitting: Emergency Medicine

## 2020-07-28 DIAGNOSIS — Z794 Long term (current) use of insulin: Secondary | ICD-10-CM | POA: Insufficient documentation

## 2020-07-28 DIAGNOSIS — F17200 Nicotine dependence, unspecified, uncomplicated: Secondary | ICD-10-CM | POA: Insufficient documentation

## 2020-07-28 DIAGNOSIS — E1110 Type 2 diabetes mellitus with ketoacidosis without coma: Secondary | ICD-10-CM | POA: Insufficient documentation

## 2020-07-28 DIAGNOSIS — R739 Hyperglycemia, unspecified: Secondary | ICD-10-CM

## 2020-07-28 DIAGNOSIS — E1165 Type 2 diabetes mellitus with hyperglycemia: Secondary | ICD-10-CM | POA: Insufficient documentation

## 2020-07-28 DIAGNOSIS — Z7984 Long term (current) use of oral hypoglycemic drugs: Secondary | ICD-10-CM | POA: Insufficient documentation

## 2020-07-28 LAB — CBC WITH DIFFERENTIAL/PLATELET
Abs Immature Granulocytes: 0.02 10*3/uL (ref 0.00–0.07)
Basophils Absolute: 0 10*3/uL (ref 0.0–0.1)
Basophils Relative: 0 %
Eosinophils Absolute: 0 10*3/uL (ref 0.0–0.5)
Eosinophils Relative: 0 %
HCT: 49.4 % (ref 39.0–52.0)
Hemoglobin: 15.3 g/dL (ref 13.0–17.0)
Immature Granulocytes: 0 %
Lymphocytes Relative: 19 %
Lymphs Abs: 1.3 10*3/uL (ref 0.7–4.0)
MCH: 29.9 pg (ref 26.0–34.0)
MCHC: 31 g/dL (ref 30.0–36.0)
MCV: 96.7 fL (ref 80.0–100.0)
Monocytes Absolute: 0.5 10*3/uL (ref 0.1–1.0)
Monocytes Relative: 7 %
Neutro Abs: 4.9 10*3/uL (ref 1.7–7.7)
Neutrophils Relative %: 74 %
Platelets: 264 10*3/uL (ref 150–400)
RBC: 5.11 MIL/uL (ref 4.22–5.81)
RDW: 13.6 % (ref 11.5–15.5)
WBC: 6.7 10*3/uL (ref 4.0–10.5)
nRBC: 0 % (ref 0.0–0.2)

## 2020-07-28 LAB — URINALYSIS, ROUTINE W REFLEX MICROSCOPIC
Bacteria, UA: NONE SEEN
Bilirubin Urine: NEGATIVE
Glucose, UA: >=500 mg/dL — AB
Hgb urine dipstick: NEGATIVE
Ketones, ur: NEGATIVE mg/dL
Leukocytes,Ua: NEGATIVE
Nitrite: NEGATIVE
Protein, ur: NEGATIVE mg/dL
Specific Gravity, Urine: 1.021 (ref 1.005–1.030)
pH: 6 (ref 5.0–8.0)

## 2020-07-28 LAB — COMPREHENSIVE METABOLIC PANEL
ALT: 22 U/L (ref 0–44)
AST: 21 U/L (ref 15–41)
Albumin: 3.9 g/dL (ref 3.5–5.0)
Alkaline Phosphatase: 85 U/L (ref 38–126)
Anion gap: 13 (ref 5–15)
BUN: 7 mg/dL (ref 6–20)
CO2: 24 mmol/L (ref 22–32)
Calcium: 8.8 mg/dL — ABNORMAL LOW (ref 8.9–10.3)
Chloride: 91 mmol/L — ABNORMAL LOW (ref 98–111)
Creatinine, Ser: 1.17 mg/dL (ref 0.61–1.24)
GFR, Estimated: >60 mL/min (ref >60)
Glucose, Bld: 1064 mg/dL (ref 70–99)
Potassium: 4.4 mmol/L (ref 3.5–5.1)
Sodium: 128 mmol/L — ABNORMAL LOW (ref 135–145)
Total Bilirubin: 0.6 mg/dL (ref 0.3–1.2)
Total Protein: 7.3 g/dL (ref 6.5–8.1)

## 2020-07-28 LAB — CBG MONITORING, ED
Glucose-Capillary: 282 mg/dL — ABNORMAL HIGH (ref 70–99)
Glucose-Capillary: 352 mg/dL — ABNORMAL HIGH (ref 70–99)
Glucose-Capillary: >600 mg/dL (ref 70–99)
Glucose-Capillary: >600 mg/dL (ref 70–99)

## 2020-07-28 MED ORDER — INSULIN ASPART 100 UNIT/ML IV SOLN
10.0000 [IU] | Freq: Once | INTRAVENOUS | Status: AC
  Administered 2020-07-28: 10 [IU] via INTRAVENOUS
  Filled 2020-07-28: qty 0.1

## 2020-07-28 MED ORDER — LACTATED RINGERS IV BOLUS
1000.0000 mL | Freq: Once | INTRAVENOUS | Status: AC
  Administered 2020-07-28: 1000 mL via INTRAVENOUS

## 2020-07-28 MED ORDER — INSULIN ASPART 100 UNIT/ML IV SOLN
3.0000 [IU] | Freq: Once | INTRAVENOUS | Status: AC
  Administered 2020-07-28: 3 [IU] via INTRAVENOUS
  Filled 2020-07-28: qty 0.03

## 2020-07-28 NOTE — ED Triage Notes (Signed)
Pt states he's had frequent urination with discomfort for two days, he denies seeing any blood in his urine

## 2020-07-28 NOTE — ED Notes (Signed)
Date and time results received: 07/28/20 0229  Test: Glucose, Bld Critical Value: 1,064  Name of Provider Notified: Preston Fleeting

## 2020-07-28 NOTE — ED Provider Notes (Signed)
[facility] DEPT Provider Note   CSN: [id number] Arrival date & time: 07/28/20  0026   History Chief Complaint: Frequent urination  Ac Newey is a 49 y.o. male.  The history is provided by the patient.  He has history of diabetes and states that he has not felt well for the last 2 days. During that time, he has been urinating frequently. He notes that when he urinates frequently, his blood sugars frequently. He has not been able to check his sugar at home because his meter is broken. He denies fever, chills, sweats. He denies nausea or vomiting or diarrhea.  Past Medical History:  Diagnosis Date  . Diabetes mellitus without complication ([place])   . Vitamin D deficiency 09/2019    Patient Active Problem List   Diagnosis Date Noted  . Hyperglycemia 09/24/2019  . Hemoglobin A1C greater than 9%, indicating poor diabetic control 09/24/2019  . Hyperlipidemia 02/26/2019  . Class 2 severe obesity due to excess calories with serious comorbidity and body mass index (BMI) of 35.0 to 35.9 in adult ([place]) 02/26/2019  . Type 2 diabetes mellitus without complication, with long-term current use of insulin ([place]) 05/13/2017  . DKA (diabetic ketoacidoses) 11/06/2012  . Hypokalemia 11/06/2012    History reviewed. No pertinent surgical history.     Family History  Problem Relation Age of Onset  . Diabetes Mellitus II Father     Social History   Tobacco Use  . Smoking status: Current Every Day Smoker    Last attempt to quit: 10/08/2012    Years since quitting: 7.8  . Smokeless tobacco: Never Used  Vaping Use  . Vaping Use: Never used  Substance Use Topics  . Alcohol use: Yes    Comment: occassionally  . Drug use: No    Home Medications Prior to Admission medications   Medication Sig Start Date End Date Taking? Authorizing Provider  atorvastatin (LIPITOR) 10 MG tablet Take 1 tablet (10 mg total) by mouth daily. 09/29/19 01/27/20  Azzie Glatter,  FNP  blood glucose meter kit and supplies KIT Dispense based on patient and insurance preference. Check blood sugar up to four times daily or as needed. ICD 10 E11.9 04/10/17   Scot Jun, FNP  busPIRone (BUSPAR) 5 MG tablet Take 1 tablet (5 mg total) by mouth 2 (two) times daily. 09/29/19   Azzie Glatter, FNP  ferrous sulfate 325 (65 FE) MG tablet Take 1 tablet (325 mg total) by mouth daily. Patient not taking: Reported on 09/23/2019 08/28/18 12/26/18  Azzie Glatter, FNP  glimepiride (AMARYL) 4 MG tablet Take 1 tablet (4 mg total) by mouth daily with breakfast. 09/29/19   Azzie Glatter, FNP  glipiZIDE (GLUCOTROL) 10 MG tablet Take 1 tablet (10 mg total) by mouth 2 (two) times daily before a meal. 09/29/19   Azzie Glatter, FNP  Insulin Glargine (LANTUS SOLOSTAR) 100 UNIT/ML Solostar Pen Inject 20 Units into the skin at bedtime. 09/23/19   Azzie Glatter, FNP  Insulin Lispro Prot & Lispro (HUMALOG MIX 75/25 KWIKPEN) (75-25) 100 UNIT/ML Kwikpen Inject 45 Units into the skin 2 (two) times daily with breakfast and lunch. 09/29/19   Azzie Glatter, FNP  naproxen (NAPROSYN) 500 MG tablet Take 1 tablet (500 mg total) by mouth 2 (two) times daily. 03/27/20   Alveria Apley, PA-C  Vitamin D, Ergocalciferol, (DRISDOL) 1.25 MG (50000 UNIT) CAPS capsule Take 1 capsule (50,000 Units total) by mouth every 7 (seven) days. 09/29/19  Azzie Glatter, FNP    Allergies    Patient has no known allergies.  Review of Systems   Review of Systems  All other systems reviewed and are negative.   Physical Exam Updated Vital Signs BP 132/86 (BP Location: Right Arm)   Pulse 92   Temp 97.9 F (36.6 C) (Oral)   Resp 18   Ht '5\' 6"'  (1.676 m)   Wt 85.3 kg   SpO2 98%   BMI 30.34 kg/m   Physical Exam Vitals and nursing note reviewed.   49 year old male, resting comfortably and in no acute distress. Vital signs are normal. Oxygen saturation is 98%, which is normal. Head is normocephalic and  atraumatic. PERRLA, EOMI. Oropharynx is clear. Neck is nontender and supple without adenopathy or JVD. Back is nontender and there is no CVA tenderness. Lungs are clear without rales, wheezes, or rhonchi. Chest is nontender. Heart has regular rate and rhythm without murmur. Abdomen is soft, flat, nontender without masses or hepatosplenomegaly and peristalsis is normoactive. Extremities have no cyanosis or edema, full range of motion is present. Skin is warm and dry without rash. Neurologic: Mental status is normal, cranial nerves are intact, there are no motor or sensory deficits.  ED Results / Procedures / Treatments   Labs (all labs ordered are listed, but only abnormal results are displayed) Labs Reviewed  URINALYSIS, ROUTINE W REFLEX MICROSCOPIC - Abnormal; Notable for the following components:      Result Value   Color, Urine COLORLESS (*)    Glucose, UA >=500 (*)    All other components within normal limits  CBG MONITORING, ED - Abnormal; Notable for the following components:   Glucose-Capillary >600 (*)    All other components within normal limits  URINE CULTURE  COMPREHENSIVE METABOLIC PANEL  CBC WITH DIFFERENTIAL/PLATELET    EKG None  Radiology No results found.  Procedures Procedures  CRITICAL CARE Performed by: Delora Fuel Total critical care time: 60 minutes Critical care time was exclusive of separately billable procedures and treating other patients. Critical care was necessary to treat or prevent imminent or life-threatening deterioration. Critical care was time spent personally by me on the following activities: development of treatment plan with patient and/or surrogate as well as nursing, discussions with consultants, evaluation of patient's response to treatment, examination of patient, obtaining history from patient or surrogate, ordering and performing treatments and interventions, ordering and review of laboratory studies, ordering and review of  radiographic studies, pulse oximetry and re-evaluation of patient's condition.  Medications Ordered in ED Medications  lactated ringers bolus 1,000 mL (1,000 mLs Intravenous New Bag/Given 07/28/20 0152)  insulin aspart (novoLOG) injection 10 Units (10 Units Intravenous Given 07/28/20 0149)    ED Course  I have reviewed the triage vital signs and the nursing notes.  Pertinent labs & imaging results that were available during my care of the patient were reviewed by me and considered in my medical decision making (see chart for details).  MDM Rules/Calculators/A&P Urinary frequency, probable hyperglycemia.  Capillary blood glucose at triage read high. Urinalysis is significant for glycosuria but no ketones noted and no evidence of infection. Old records are reviewed, and he does have prior ED visits for hyperglycemia. He is given IV fluids and insulin and screening labs obtained.   CBC is normal.  Metabolic panel shows glucose 1064.  Sodium is appropriately low at 128.  CO2 is normal and anion gap is normal. - no evidence of ketoacidosis after IV fluids and  1 dose of insulin, glucose was still reading high on CBG monitor.  He is given a second dose of IV insulin, and glucose has come down to 352.  He is given a small dose of insulin and glucose is come down to 282.  He is felt to be safe for discharge at this point.  He is instructed to either have his glucose monitor fixed, or get a new one.  He will need to follow-up with his PCP regarding any insulin dose adjustments.  Return precautions discussed.  Final Clinical Impression(s) / ED Diagnoses Final diagnoses:  Hyperglycemia    Rx / DC Orders ED Discharge Orders    None       Delora Fuel, MD 14/38/88 [phone]

## 2020-07-28 NOTE — Discharge Instructions (Addendum)
Please either get a new glucose monitor, or get the one you have at home fixed.  You need to see your primary care provider to see if you need your insulin dose adjusted.  Return if you are having any problems.

## 2020-07-28 NOTE — ED Notes (Signed)
Date and time results received: 07/28/20 0222 (use smartphrase ".now" to insert current time)  Test: glucose Critical Value: 1064  Name of Provider Notified: Preston Fleeting  Orders Received? Or Actions Taken?: none yet

## 2020-07-29 LAB — URINE CULTURE: Culture: NO GROWTH

## 2020-08-12 ENCOUNTER — Other Ambulatory Visit: Payer: Self-pay | Admitting: Family Medicine

## 2020-08-17 NOTE — Telephone Encounter (Signed)
Please see refill request.

## 2020-09-28 ENCOUNTER — Other Ambulatory Visit: Payer: Self-pay

## 2020-09-28 ENCOUNTER — Encounter (HOSPITAL_COMMUNITY): Payer: Self-pay | Admitting: *Deleted

## 2020-09-28 ENCOUNTER — Emergency Department (HOSPITAL_COMMUNITY)
Admission: EM | Admit: 2020-09-28 | Discharge: 2020-09-29 | Disposition: A | Discharge status: Home / Self Care | Payer: Self-pay | Attending: Emergency Medicine | Admitting: Emergency Medicine

## 2020-09-28 DIAGNOSIS — Z5321 Procedure and treatment not carried out due to patient leaving prior to being seen by health care provider: Secondary | ICD-10-CM | POA: Insufficient documentation

## 2020-09-28 DIAGNOSIS — Z794 Long term (current) use of insulin: Secondary | ICD-10-CM | POA: Insufficient documentation

## 2020-09-28 DIAGNOSIS — R519 Headache, unspecified: Secondary | ICD-10-CM | POA: Insufficient documentation

## 2020-09-28 DIAGNOSIS — E119 Type 2 diabetes mellitus without complications: Secondary | ICD-10-CM | POA: Insufficient documentation

## 2020-09-28 DIAGNOSIS — R350 Frequency of micturition: Secondary | ICD-10-CM | POA: Insufficient documentation

## 2020-09-28 DIAGNOSIS — H538 Other visual disturbances: Secondary | ICD-10-CM | POA: Insufficient documentation

## 2020-09-28 NOTE — ED Triage Notes (Signed)
Pt states his glucose has been in the 500's for 3 days, out of his Humalog and another insulin. Has had urinary frequency, headaches, blurry vision.

## 2020-09-29 LAB — BASIC METABOLIC PANEL
Anion gap: 15 (ref 5–15)
BUN: 6 mg/dL (ref 6–20)
CO2: 20 mmol/L — ABNORMAL LOW (ref 22–32)
Calcium: 9.1 mg/dL (ref 8.9–10.3)
Chloride: 100 mmol/L (ref 98–111)
Creatinine, Ser: 0.9 mg/dL (ref 0.61–1.24)
GFR, Estimated: >60 mL/min (ref >60)
Glucose, Bld: 395 mg/dL — ABNORMAL HIGH (ref 70–99)
Potassium: 3.4 mmol/L — ABNORMAL LOW (ref 3.5–5.1)
Sodium: 135 mmol/L (ref 135–145)

## 2020-09-29 LAB — CBC
HCT: 45.7 % (ref 39.0–52.0)
Hemoglobin: 15.1 g/dL (ref 13.0–17.0)
MCH: 29.5 pg (ref 26.0–34.0)
MCHC: 33 g/dL (ref 30.0–36.0)
MCV: 89.3 fL (ref 80.0–100.0)
Platelets: 258 10*3/uL (ref 150–400)
RBC: 5.12 MIL/uL (ref 4.22–5.81)
RDW: 13 % (ref 11.5–15.5)
WBC: 6.5 10*3/uL (ref 4.0–10.5)
nRBC: 0 % (ref 0.0–0.2)

## 2020-09-29 LAB — CBG MONITORING, ED: Glucose-Capillary: 396 mg/dL — ABNORMAL HIGH (ref 70–99)

## 2020-09-29 NOTE — ED Notes (Signed)
Called x1 for vitals rechck with no resonse

## 2020-10-13 ENCOUNTER — Encounter (HOSPITAL_COMMUNITY): Payer: Self-pay

## 2020-10-13 ENCOUNTER — Emergency Department (HOSPITAL_COMMUNITY)
Admission: EM | Admit: 2020-10-13 | Discharge: 2020-10-13 | Disposition: A | Discharge status: Home / Self Care | Payer: Self-pay | Attending: Emergency Medicine | Admitting: Emergency Medicine

## 2020-10-13 ENCOUNTER — Other Ambulatory Visit: Payer: Self-pay

## 2020-10-13 DIAGNOSIS — F1990 Other psychoactive substance use, unspecified, uncomplicated: Secondary | ICD-10-CM

## 2020-10-13 DIAGNOSIS — F1410 Cocaine abuse, uncomplicated: Secondary | ICD-10-CM | POA: Insufficient documentation

## 2020-10-13 DIAGNOSIS — Z794 Long term (current) use of insulin: Secondary | ICD-10-CM | POA: Insufficient documentation

## 2020-10-13 DIAGNOSIS — E1169 Type 2 diabetes mellitus with other specified complication: Secondary | ICD-10-CM | POA: Insufficient documentation

## 2020-10-13 DIAGNOSIS — Z7984 Long term (current) use of oral hypoglycemic drugs: Secondary | ICD-10-CM | POA: Insufficient documentation

## 2020-10-13 DIAGNOSIS — F17200 Nicotine dependence, unspecified, uncomplicated: Secondary | ICD-10-CM | POA: Insufficient documentation

## 2020-10-13 DIAGNOSIS — F1910 Other psychoactive substance abuse, uncomplicated: Secondary | ICD-10-CM | POA: Insufficient documentation

## 2020-10-13 DIAGNOSIS — Z79899 Other long term (current) drug therapy: Secondary | ICD-10-CM | POA: Insufficient documentation

## 2020-10-13 DIAGNOSIS — E785 Hyperlipidemia, unspecified: Secondary | ICD-10-CM | POA: Insufficient documentation

## 2020-10-13 DIAGNOSIS — Z9114 Patient's other noncompliance with medication regimen: Secondary | ICD-10-CM | POA: Insufficient documentation

## 2020-10-13 LAB — CBC
HCT: 46.5 % (ref 39.0–52.0)
Hemoglobin: 15.4 g/dL (ref 13.0–17.0)
MCH: 30.7 pg (ref 26.0–34.0)
MCHC: 33.1 g/dL (ref 30.0–36.0)
MCV: 92.8 fL (ref 80.0–100.0)
Platelets: 239 10*3/uL (ref 150–400)
RBC: 5.01 MIL/uL (ref 4.22–5.81)
RDW: 13.5 % (ref 11.5–15.5)
WBC: 6.3 10*3/uL (ref 4.0–10.5)
nRBC: 0 % (ref 0.0–0.2)

## 2020-10-13 LAB — BASIC METABOLIC PANEL
Anion gap: 12 (ref 5–15)
BUN: 13 mg/dL (ref 6–20)
CO2: 22 mmol/L (ref 22–32)
Calcium: 9.3 mg/dL (ref 8.9–10.3)
Chloride: 91 mmol/L — ABNORMAL LOW (ref 98–111)
Creatinine, Ser: 1 mg/dL (ref 0.61–1.24)
GFR, Estimated: >60 mL/min (ref >60)
Glucose, Bld: 894 mg/dL (ref 70–99)
Potassium: 4.5 mmol/L (ref 3.5–5.1)
Sodium: 125 mmol/L — ABNORMAL LOW (ref 135–145)

## 2020-10-13 LAB — URINALYSIS, ROUTINE W REFLEX MICROSCOPIC
Bacteria, UA: NONE SEEN
Bilirubin Urine: NEGATIVE
Glucose, UA: >=500 mg/dL — AB
Hgb urine dipstick: NEGATIVE
Ketones, ur: NEGATIVE mg/dL
Leukocytes,Ua: NEGATIVE
Nitrite: NEGATIVE
Protein, ur: NEGATIVE mg/dL
Specific Gravity, Urine: 1.029 (ref 1.005–1.030)
pH: 5 (ref 5.0–8.0)

## 2020-10-13 LAB — RAPID URINE DRUG SCREEN, HOSP PERFORMED
Amphetamines: NOT DETECTED
Barbiturates: NOT DETECTED
Benzodiazepines: NOT DETECTED
Cocaine: POSITIVE — AB
Opiates: NOT DETECTED
Tetrahydrocannabinol: NOT DETECTED

## 2020-10-13 LAB — CBG MONITORING, ED
Glucose-Capillary: >600 mg/dL (ref 70–99)
Glucose-Capillary: 336 mg/dL — ABNORMAL HIGH (ref 70–99)
Glucose-Capillary: 501 mg/dL (ref 70–99)

## 2020-10-13 LAB — ETHANOL: Alcohol, Ethyl (B): <10 mg/dL (ref <10)

## 2020-10-13 MED ORDER — INSULIN LISPRO 100 UNIT/ML ~~LOC~~ SOLN: 45.0000 [IU] | Freq: Two times a day (BID) | SUBCUTANEOUS | 11 refills | Status: DC

## 2020-10-13 MED ORDER — LACTATED RINGERS IV BOLUS
2000.0000 mL | Freq: Once | INTRAVENOUS | Status: AC
  Administered 2020-10-13: 2000 mL via INTRAVENOUS

## 2020-10-13 MED ORDER — INSULIN GLARGINE 100 UNIT/ML ~~LOC~~ SOLN: 20.0000 [IU] | Freq: Every day | SUBCUTANEOUS | 11 refills | Status: DC

## 2020-10-13 MED ORDER — INSULIN LISPRO PROT & LISPRO (75-25 MIX) 100 UNIT/ML KWIKPEN: 45.0000 [IU] | PEN_INJECTOR | Freq: Two times a day (BID) | SUBCUTANEOUS | 11 refills | Status: DC

## 2020-10-13 MED ORDER — BLOOD GLUCOSE MONITOR KIT: PACK | 0 refills | Status: DC

## 2020-10-13 MED ORDER — GLIMEPIRIDE 4 MG PO TABS: 4.0000 mg | ORAL_TABLET | Freq: Every day | ORAL | 0 refills | Status: DC

## 2020-10-13 MED ORDER — LANTUS SOLOSTAR 100 UNIT/ML ~~LOC~~ SOPN: 20.0000 [IU] | PEN_INJECTOR | Freq: Every day | SUBCUTANEOUS | 0 refills | Status: DC

## 2020-10-13 MED ORDER — INSULIN ASPART 100 UNIT/ML ~~LOC~~ SOLN
15.0000 [IU] | Freq: Once | SUBCUTANEOUS | Status: AC
  Administered 2020-10-13: 15 [IU] via INTRAVENOUS

## 2020-10-13 MED ORDER — GLIPIZIDE 10 MG PO TABS: 10.0000 mg | ORAL_TABLET | Freq: Two times a day (BID) | ORAL | 0 refills | Status: DC

## 2020-10-13 MED ORDER — "INSULIN SYRINGE/NEEDLE 28G X 1/2"" 1 ML MISC": 1.0000 | 0 refills | Status: DC

## 2020-10-13 NOTE — Care Management (Signed)
ED RNCM delivered patent's discharge meds to Daija RN,  No further ED TOC needs identified. ED CSW has arranged transport to Detox in [place].

## 2020-10-13 NOTE — Progress Notes (Signed)
CSW spoke to [facility] in [place] who state that Pt will be assessed in the morning.  For tonight he will be in the chairs. CSW informed Pt of procedure. Pt verbalized understanding. CSW called [facility] to arrange transport.

## 2020-10-13 NOTE — Social Work (Signed)
ToC team met with Pt at bedside. Pt states that he would like to detox from crack cocaine use. CSW called [facility] [place] and they report that Pt is eligible to come for assessment once medically stable as long as he has his medications with him. TOC team will continue to work on case.

## 2020-10-13 NOTE — Care Management (Signed)
Received ED [facility] consult concerning medication assistance. Patient is a diabetic on insulin and has run out of insulin, he is uninsured and has been active in the past with the [facility]. Patient is requesting drug detox, and ED CSW has placed referral to [facility] in [place].  ED evaluation is still pending,but when patient is medically stable for discharge TOC will assist with securing patient's meds

## 2020-10-13 NOTE — ED Provider Notes (Signed)
[facility] EMERGENCY DEPARTMENT Provider Note   CSN: [id number] Arrival date & time: 10/13/20  1356     History No chief complaint on file.   Dakota Kim is a 50 y.o. male.  HPI Patient is a 50 year old male presenting today with request for drug rehab assistance.  His CBG was checked in triage and his blood sugar was found to be greater than 600.  Patient has a history of DM 2 with history of DKA, also has HLD, A1c greater than 9, homelessness, cocaine use  Patient states that he has not used any of his medications for 5 days.  He states that he use them sometimes before 5 days ago.  He states that he recently went on a cocaine binge and for the past 5 days has been consistently using cocaine.  He states that he did not sleep during this entire time.  He states that he is homeless and lives out of his car.  He states that he is now drinking lots of water and peeing frequently.  He has no other symptoms.  No nausea, vomiting, abdominal pain, fevers, chills, cough, congestion, chest pain, shortness of breath.  He denies any recent alcohol use.  He denies any other recreational drug use.  He denies any injection drug use.    Past Medical History:  Diagnosis Date  . Diabetes mellitus without complication ([place])   . Vitamin D deficiency 09/2019    Patient Active Problem List   Diagnosis Date Noted  . Hyperglycemia 09/24/2019  . Hemoglobin A1C greater than 9%, indicating poor diabetic control 09/24/2019  . Hyperlipidemia 02/26/2019  . Class 2 severe obesity due to excess calories with serious comorbidity and body mass index (BMI) of 35.0 to 35.9 in adult ([place]) 02/26/2019  . Type 2 diabetes mellitus without complication, with long-term current use of insulin ([place]) 05/13/2017  . DKA (diabetic ketoacidoses) 11/06/2012  . Hypokalemia 11/06/2012    History reviewed. No pertinent surgical history.     Family History  Problem Relation Age of Onset  . Diabetes  Mellitus II Father     Social History   Tobacco Use  . Smoking status: Current Every Day Smoker    Last attempt to quit: 10/08/2012    Years since quitting: 8.0  . Smokeless tobacco: Never Used  Vaping Use  . Vaping Use: Never used  Substance Use Topics  . Alcohol use: Yes    Comment: occassionally  . Drug use: No    Home Medications Prior to Admission medications   Medication Sig Start Date End Date Taking? Authorizing Provider  INS SYRINGE/NEEDLE 1CC/28G 28G X 1/2" 1 ML MISC 1 applicator by Does not apply route See admin instructions. 10/13/20  Yes Gareth Morgan, MD  insulin glargine (LANTUS) 100 UNIT/ML injection Inject 0.2 mLs (20 Units total) into the skin daily. 10/13/20  Yes Gareth Morgan, MD  insulin lispro (HUMALOG) 100 UNIT/ML injection Inject 0.45 mLs (45 Units total) into the skin 2 (two) times daily with breakfast and lunch. 10/13/20  Yes Gareth Morgan, MD  atorvastatin (LIPITOR) 10 MG tablet Take 1 tablet (10 mg total) by mouth daily. 09/29/19 07/28/20  Azzie Glatter, FNP  blood glucose meter kit and supplies KIT Dispense based on patient and insurance preference. Check blood sugar up to four times daily or as needed. ICD 10 E11.9 10/13/20   Gareth Morgan, MD  busPIRone (BUSPAR) 5 MG tablet Take 1 tablet (5 mg total) by mouth 2 (two) times daily.  09/29/19   Azzie Glatter, FNP  glimepiride (AMARYL) 4 MG tablet Take 1 tablet (4 mg total) by mouth daily with breakfast. 10/13/20   Tedd Sias, PA  glipiZIDE (GLUCOTROL) 10 MG tablet Take 1 tablet (10 mg total) by mouth 2 (two) times daily before a meal. 10/13/20   Benjermin Korber, Kathleene Hazel, PA  ferrous sulfate 325 (65 FE) MG tablet Take 1 tablet (325 mg total) by mouth daily. Patient not taking: Reported on 09/23/2019 08/28/18 07/28/20  Azzie Glatter, FNP  Insulin Lispro Prot & Lispro (HUMALOG MIX 75/25 KWIKPEN) (75-25) 100 UNIT/ML Kwikpen Inject 45 Units into the skin 2 (two) times daily with breakfast and lunch. 10/13/20  10/13/20  Tedd Sias, PA    Allergies    Patient has no known allergies.  Review of Systems   Review of Systems  Constitutional: Negative for chills and fever.  HENT: Negative for congestion.   Eyes: Negative for pain.  Respiratory: Negative for cough and shortness of breath.   Cardiovascular: Negative for chest pain and leg swelling.  Gastrointestinal: Negative for abdominal pain and vomiting.  Endocrine: Positive for polydipsia and polyuria.  Genitourinary: Negative for dysuria.  Musculoskeletal: Negative for myalgias.  Skin: Negative for rash.  Neurological: Negative for dizziness and headaches.  Psychiatric/Behavioral: Positive for sleep disturbance (Due to cocaine).    Physical Exam Updated Vital Signs BP 122/84   Pulse 86   Temp 98.6 F (37 C) (Oral)   Resp 13   SpO2 100%   Physical Exam Vitals and nursing note reviewed.  Constitutional:      General: He is not in acute distress.    Comments: Sleepy but easily arousable 50 year old gentleman appears older than stated age  HENT:     Head: Normocephalic and atraumatic.     Nose: Nose normal.     Mouth/Throat:     Mouth: Mucous membranes are dry.  Eyes:     General: No scleral icterus. Cardiovascular:     Rate and Rhythm: Normal rate and regular rhythm.     Pulses: Normal pulses.     Heart sounds: Normal heart sounds.  Pulmonary:     Effort: Pulmonary effort is normal. No respiratory distress.     Breath sounds: Normal breath sounds. No wheezing.  Abdominal:     Palpations: Abdomen is soft.     Tenderness: There is no abdominal tenderness. There is no guarding or rebound.  Musculoskeletal:     Cervical back: Normal range of motion.     Right lower leg: No edema.     Left lower leg: No edema.  Skin:    General: Skin is warm and dry.     Capillary Refill: Capillary refill takes less than 2 seconds.  Neurological:     Mental Status: He is alert. Mental status is at baseline.     Comments: Alert and  oriented Moves all 4 extremities Sensation intact in all 4 extremities  Psychiatric:        Mood and Affect: Mood normal.        Behavior: Behavior normal.     ED Results / Procedures / Treatments   Labs (all labs ordered are listed, but only abnormal results are displayed) Labs Reviewed  BASIC METABOLIC PANEL - Abnormal; Notable for the following components:      Result Value   Sodium 125 (*)    Chloride 91 (*)    Glucose, Bld 894 (*)    All other components within  normal limits  URINALYSIS, ROUTINE W REFLEX MICROSCOPIC - Abnormal; Notable for the following components:   Color, Urine COLORLESS (*)    Glucose, UA >=500 (*)    All other components within normal limits  RAPID URINE DRUG SCREEN, HOSP PERFORMED - Abnormal; Notable for the following components:   Cocaine POSITIVE (*)    All other components within normal limits  CBG MONITORING, ED - Abnormal; Notable for the following components:   Glucose-Capillary >600 (*)    All other components within normal limits  CBG MONITORING, ED - Abnormal; Notable for the following components:   Glucose-Capillary 501 (*)    All other components within normal limits  CBG MONITORING, ED - Abnormal; Notable for the following components:   Glucose-Capillary 336 (*)    All other components within normal limits  CBC  ETHANOL    EKG EKG Interpretation  Date/Time:  Wednesday October 13 2020 17:27:58 EST Ventricular Rate:  84 PR Interval:    QRS Duration: 70 QT Interval:  358 QTC Calculation: 424 R Axis:   51 Text Interpretation: Sinus rhythm Low voltage, precordial leads Anteroseptal infarct, old Borderline T abnormalities, inferior leads Confirmed by Ripley Fraise [phone]) on 10/14/2020 9:11:25 AM   Radiology No results found.  Procedures Procedures   Medications Ordered in ED Medications  lactated ringers bolus 2,000 mL (0 mLs Intravenous Stopped 10/13/20 1803)  insulin aspart (novoLOG) injection 15 Units (15 Units  Intravenous Given 10/13/20 1636)    ED Course  I have reviewed the triage vital signs and the nursing notes.  Pertinent labs & imaging results that were available during my care of the patient were reviewed by me and considered in my medical decision making (see chart for details).  Patient is a 50 year old male presented today after a 5-day "bender "of using cocaine not eating, sleeping or taking his multiple diabetes medications including glipizide, glimepiride, insulin  He has no complaints apart from polydipsia and polyuria. His blood sugar is found to be nearly 900.  He appears dehydrated He was given 2 L of LR Also given 15 IV insulin  There is no indication of this patient's hyperglycemia was due to ischemia, infection, or other emergent cause of hypoglycemia.  Seems to be due to medication indiscretion as well as cocaine use.  BMP unremarkable apart from sodium and glucose discussed below.  CBC without leukocytosis or anemia.  Urinalysis without evidence of infection glucosuria.  No protein.  Rapid urine drug screen positive for cocaine.  Ethanol negative. Clinical Course as of 10/14/20 1014  Wed Oct 13, 2020  1619 Goal < 400 BS [WF]  1821 Sodium(!): 125 Sodium to 138 when correcting for hyperglycemia [WF]  1822 Glucose(!!): 894 [WF]    Clinical Course User Index [WF] Tedd Sias, PA  Blood sugar 336 prior to discharge Transition of care team saw patient while rehydration and normalization of blood sugar was being addressed. A drug rehab facility has been identified for him and he will be directed to this location by [facility] team.  All of his medications were represcribed to me  MDM Rules/Calculators/A&P                          Discussed with pharmacy prior to DC.  Given patient's hypoglycemia it is reasonable to continue patient on his current dose of twice daily mixed Humalog, Lantus, Amaryl and Glucotrol.  All these medications were sent to cardiology pharmacy and  obtained by transitional  care team and provided to the patient.  Patient's long-acting insulin pens were switched to vials in order to facilitate patient's match program so he could receive his medications for free.  This was done by Dr. Billy Fischer.  Final Clinical Impression(s) / ED Diagnoses Final diagnoses:  Cocaine abuse ([place])  Noncompliance w/medication treatment due to intermit use of medication  Substance use    Rx / DC Orders ED Discharge Orders         Ordered    glimepiride (AMARYL) 4 MG tablet  Daily with breakfast        10/13/20 1833    glipiZIDE (GLUCOTROL) 10 MG tablet  2 times daily before meals        10/13/20 1833    Insulin Lispro Prot & Lispro (HUMALOG MIX 75/25 KWIKPEN) (75-25) 100 UNIT/ML Kwikpen  2 times daily with breakfast and lunch,   Status:  Discontinued        10/13/20 1833    insulin glargine (LANTUS SOLOSTAR) 100 UNIT/ML Solostar Pen  Daily at bedtime,   Status:  Discontinued        10/13/20 1833    insulin glargine (LANTUS) 100 UNIT/ML injection  Daily        10/13/20 1957    insulin lispro (HUMALOG) 100 UNIT/ML injection  2 times daily with breakfast and lunch        10/13/20 1957    blood glucose meter kit and supplies KIT        10/13/20 1957    INS SYRINGE/NEEDLE 1CC/28G 28G X 1/2" 1 ML MISC  See admin instructions        10/13/20 2000           Pati Gallo [place], [place] 10/14/20 1015    Gareth Morgan, MD 10/23/20 2100

## 2020-10-13 NOTE — Discharge Instructions (Addendum)
Please follow-up instructions given by our transitional care team/case management/social worker in terms of placement in rehab facility.  Please take your medications as prescribed.  Your blood sugar was nearly thousand today.  If left untreated your poorly managed diabetes may result in blindness, stroke, could result in kidney failure resulting in dialysis, or other significant issues including death.  Please take your insulin as prescribed.  I have refilled these medications for you and our social worker has provided them to you.

## 2020-10-13 NOTE — Care Management (Signed)
  [facility] Medication Assistance Card Name: Lisa Blakeman ID (MRN): [id number] Bin: [id number] RX Group: [id number] Discharge Date: 10/13/2020 Expiration Date: 10/27/2020                                           (must be filled within 7 days of discharge)         Dear   : Janit Bern  You have been approved to have the prescriptions written by your discharging physician filled through our [facility] (Medication Assistance Through [facility]) program. This program allows for a one-time (no refills) 34-day supply of selected medications for a low copay amount.  The copay is $3.00 per prescription. For instance, if you have one prescription, you will pay $3.00; for two prescriptions, you pay $6.00; for three prescriptions, you pay $9.00; and so on.  Only certain pharmacies are participating in this program with [facility]. You will need to select one of the pharmacies from the attached list and take your prescriptions, this letter, and your photo ID to one of the participating pharmacies.   We are excited that you are able to use the [facility] program to get your medications. These prescriptions must be filled within 7 days of hospital discharge or they will no longer be valid for the [facility] program. Should you have any problems with your prescriptions please contact your case management team member at [phone] for /[place].  Thank you, [facility] Care Management

## 2020-10-13 NOTE — ED Triage Notes (Signed)
Patient to ED requesting detox from crack and cocaine. Denies ETOH. Patient is diabetic and states not taking his insulin as prescribed. Patient CBG reading high. Appears sleepy but easily arousable and answers questions appropriately

## 2020-10-13 NOTE — ED Notes (Signed)
Date and time results received: 10/13/20 1609 (use smartphrase ".now" to insert current time)  Test: glucose  Critical Value: 894  Name of Provider Notified: Blanchie Dessert PA   Orders Received? Or Actions Taken?:

## 2020-11-02 ENCOUNTER — Other Ambulatory Visit: Payer: Self-pay

## 2020-11-02 ENCOUNTER — Telehealth: Payer: Self-pay | Admitting: Family Medicine

## 2020-11-02 ENCOUNTER — Other Ambulatory Visit: Payer: Self-pay | Admitting: Family Medicine

## 2020-11-02 DIAGNOSIS — E119 Type 2 diabetes mellitus without complications: Secondary | ICD-10-CM

## 2020-11-02 DIAGNOSIS — Z794 Long term (current) use of insulin: Secondary | ICD-10-CM

## 2020-11-02 MED ORDER — INSULIN LISPRO 100 UNIT/ML ~~LOC~~ SOLN: 45.0000 [IU] | Freq: Two times a day (BID) | SUBCUTANEOUS | 1 refills | Status: DC

## 2020-11-02 MED ORDER — INSULIN GLARGINE 100 UNIT/ML ~~LOC~~ SOLN: 20.0000 [IU] | Freq: Every day | SUBCUTANEOUS | 1 refills | Status: DC

## 2020-11-02 MED FILL — ?HUMALOG 100 UNITS/ML VIAL: 100 | 22 days supply | Qty: 20 | Fill #0

## 2020-11-02 MED FILL — !LANTUS 100 UNITS/ML VIAL: 100 | 28 days supply | Qty: 10 | Fill #0

## 2020-11-02 NOTE — Telephone Encounter (Signed)
1 year's refills on insulin.

## 2020-11-02 NOTE — Telephone Encounter (Signed)
Called and spoke with patient he didn't switch pcp. He has been going  to the ED for his diabetes , pt has appt to follow up in 02/2021. I told the patient that he will need to seen sooner, set up an appt for patient 11/17/2020 , [facility]  , pt never received his insulin because it was sent to the wrong pharmacy.I told pt that we gave him enough medication to last him until he comes for his appointment on 11/17/2020 and he must follow up and keep this appt.

## 2020-11-17 ENCOUNTER — Ambulatory Visit (HOSPITAL_COMMUNITY): Payer: Self-pay | Admitting: Licensed Clinical Social Worker

## 2020-11-17 ENCOUNTER — Other Ambulatory Visit: Payer: Self-pay | Admitting: Family Medicine

## 2020-11-17 ENCOUNTER — Ambulatory Visit (INDEPENDENT_AMBULATORY_CARE_PROVIDER_SITE_OTHER): Payer: HRSA Program | Admitting: Family Medicine

## 2020-11-17 ENCOUNTER — Other Ambulatory Visit: Payer: Self-pay

## 2020-11-17 ENCOUNTER — Encounter: Payer: Self-pay | Admitting: Family Medicine

## 2020-11-17 VITALS — BP 124/80 | HR 63 | Ht 66.0 in | Wt 184.0 lb

## 2020-11-17 DIAGNOSIS — E785 Hyperlipidemia, unspecified: Secondary | ICD-10-CM

## 2020-11-17 DIAGNOSIS — F419 Anxiety disorder, unspecified: Secondary | ICD-10-CM

## 2020-11-17 DIAGNOSIS — Z0000 Encounter for general adult medical examination without abnormal findings: Secondary | ICD-10-CM

## 2020-11-17 DIAGNOSIS — R7309 Other abnormal glucose: Secondary | ICD-10-CM

## 2020-11-17 DIAGNOSIS — Z1211 Encounter for screening for malignant neoplasm of colon: Secondary | ICD-10-CM

## 2020-11-17 DIAGNOSIS — R739 Hyperglycemia, unspecified: Secondary | ICD-10-CM

## 2020-11-17 DIAGNOSIS — Z09 Encounter for follow-up examination after completed treatment for conditions other than malignant neoplasm: Secondary | ICD-10-CM

## 2020-11-17 DIAGNOSIS — Z760 Encounter for issue of repeat prescription: Secondary | ICD-10-CM

## 2020-11-17 DIAGNOSIS — F4321 Adjustment disorder with depressed mood: Secondary | ICD-10-CM

## 2020-11-17 DIAGNOSIS — Z794 Long term (current) use of insulin: Secondary | ICD-10-CM

## 2020-11-17 DIAGNOSIS — E119 Type 2 diabetes mellitus without complications: Secondary | ICD-10-CM

## 2020-11-17 LAB — POCT GLYCOSYLATED HEMOGLOBIN (HGB A1C): Hemoglobin A1C: 10.2 % — AB (ref 4.0–5.6)

## 2020-11-17 MED ORDER — GLIPIZIDE 10 MG PO TABS: 10.0000 mg | ORAL_TABLET | Freq: Two times a day (BID) | ORAL | 11 refills | Status: DC

## 2020-11-17 MED ORDER — INSULIN LISPRO 100 UNIT/ML ~~LOC~~ SOLN: 30.0000 [IU] | Freq: Two times a day (BID) | SUBCUTANEOUS | 11 refills | Status: DC

## 2020-11-17 MED ORDER — GLIMEPIRIDE 4 MG PO TABS: 4.0000 mg | ORAL_TABLET | Freq: Every day | ORAL | 11 refills | Status: DC

## 2020-11-17 MED ORDER — ATORVASTATIN CALCIUM 10 MG PO TABS
10.0000 mg | ORAL_TABLET | Freq: Every day | ORAL | 11 refills | Status: DC
Start: 2020-11-17 — End: 2021-03-24

## 2020-11-17 MED ORDER — INSULIN GLARGINE 100 UNIT/ML ~~LOC~~ SOLN: 20.0000 [IU] | Freq: Every day | SUBCUTANEOUS | 11 refills | Status: DC

## 2020-11-17 MED ORDER — "INSULIN SYRINGE/NEEDLE 28G X 1/2"" 1 ML MISC": 1.0000 | 11 refills | Status: DC

## 2020-11-17 MED ORDER — BUSPIRONE HCL 5 MG PO TABS: 5.0000 mg | ORAL_TABLET | Freq: Two times a day (BID) | ORAL | 11 refills | Status: DC

## 2020-11-17 MED ORDER — BLOOD GLUCOSE MONITOR KIT: PACK | 0 refills | Status: DC

## 2020-11-17 MED FILL — ?ATORVASTATIN 10 MG TABLET: 10 | 30 days supply | Qty: 30 | Fill #0

## 2020-11-17 MED FILL — busPIRone HCL 5 MG TABS: 5 | 30 days supply | Qty: 60 | Fill #0

## 2020-11-17 MED FILL — GLIMEPIRIDE 4 MG TABS: 4 | 30 days supply | Qty: 30 | Fill #0

## 2020-11-17 MED FILL — TRUEPLUS SYR 1ML 31GX5/16: 31G X 5/16" | 30 days supply | Qty: 100 | Fill #0

## 2020-11-17 MED FILL — ?glipiZIDE 10MG TABLETS: 10 | 30 days supply | Qty: 60 | Fill #0

## 2020-11-17 NOTE — Progress Notes (Signed)
Patient [facility]   Established Patient Office Visit  Subjective:  Patient ID: Dakota Kim, male    DOB: 1971-07-15  Age: 50 y.o. MRN: [id number]  CC:  Chief Complaint  Patient presents with  . Diabetes    HPI Dakota Kim is a 50 year old male who presents for Hospital Follow Up today.   Patient Active Problem List   Diagnosis Date Noted  . Hyperglycemia 09/24/2019  . Hemoglobin A1C greater than 9%, indicating poor diabetic control 09/24/2019  . Hyperlipidemia 02/26/2019  . Class 2 severe obesity due to excess calories with serious comorbidity and body mass index (BMI) of 35.0 to 35.9 in adult ([place]) 02/26/2019  . Type 2 diabetes mellitus without complication, with long-term current use of insulin ([place]) 05/13/2017  . DKA (diabetic ketoacidoses) 11/06/2012  . Hypokalemia 11/06/2012   Current Status: Since his last office visit, he has had an ED visit for Hyperglycemia and Cocaine Abuse on 10/13/2020. Today, he admits that he has not been taking his medications or monitoring his blood glucose levels lately. He recently lost his father and has had a relapse. He denies fatigue, frequent urination, blurred vision, excessive hunger, excessive thirst, weight gain, weight loss, and poor wound healing. He continues to check his feet regularly. His anxiety is moderate. He denies suicidal ideations, homicidal ideations, or auditory hallucinations. He denies fevers, chills, fatigue, recent infections, weight loss, and night sweats. He has not had any headaches, visual changes, dizziness, and falls. No chest pain, heart palpitations, cough and shortness of breath reported. Denies GI problems such as nausea, vomiting, diarrhea, and constipation. He has no reports of blood in stools, dysuria and hematuria. He is taking all medications as prescribed. He denies pain today.   Past Medical History:  Diagnosis Date  . Diabetes mellitus without complication  ([place])   . Vitamin D deficiency 09/2019    No past surgical history on file.  Family History  Problem Relation Age of Onset  . Diabetes Mellitus II Father     Social History   Socioeconomic History  . Marital status: Single    Spouse name: Not on file  . Number of children: Not on file  . Years of education: Not on file  . Highest education level: Not on file  Occupational History  . Not on file  Tobacco Use  . Smoking status: Current Every Day Smoker    Last attempt to quit: 10/08/2012    Years since quitting: 8.1  . Smokeless tobacco: Never Used  Vaping Use  . Vaping Use: Never used  Substance and Sexual Activity  . Alcohol use: Yes    Comment: occassionally  . Drug use: No  . Sexual activity: Not Currently  Other Topics Concern  . Not on file  Social History Narrative  . Not on file   Social Determinants of Health   Financial Resource Strain: Not on file  Food Insecurity: Not on file  Transportation Needs: Not on file  Physical Activity: Not on file  Stress: Not on file  Social Connections: Not on file  Intimate Partner Violence: Not on file    Outpatient Medications Prior to Visit  Medication Sig Dispense Refill  . atorvastatin (LIPITOR) 10 MG tablet Take 1 tablet (10 mg total) by mouth daily. 30 tablet 3  . blood glucose meter kit and supplies KIT Dispense based on patient and insurance preference. Check blood sugar up to four times daily or as needed.  ICD 10 E11.9 1 each 0  . busPIRone (BUSPAR) 5 MG tablet Take 1 tablet (5 mg total) by mouth 2 (two) times daily. 60 tablet 3  . glimepiride (AMARYL) 4 MG tablet Take 1 tablet (4 mg total) by mouth daily with breakfast. 30 tablet 0  . glipiZIDE (GLUCOTROL) 10 MG tablet Take 1 tablet (10 mg total) by mouth 2 (two) times daily before a meal. 60 tablet 0  . INS SYRINGE/NEEDLE 1CC/28G 28G X 1/2" 1 ML MISC 1 applicator by Does not apply route See admin instructions. 100 each 0  . insulin glargine (LANTUS) 100  UNIT/ML injection Inject 0.2 mLs (20 Units total) into the skin daily. 10 mL 1  . insulin lispro (HUMALOG) 100 UNIT/ML injection Inject 0.45 mLs (45 Units total) into the skin 2 (two) times daily with breakfast and lunch. 10 mL 1   No facility-administered medications prior to visit.    No Known Allergies  ROS Review of Systems  Constitutional: Negative.   HENT: Negative.   Eyes: Negative.   Respiratory: Negative.   Cardiovascular: Negative.   Gastrointestinal: Positive for abdominal distention (obese).  Endocrine: Negative.   Genitourinary: Negative.   Musculoskeletal: Positive for arthralgias (generalized joint pain ).  Skin: Negative.   Allergic/Immunologic: Negative.   Neurological: Positive for dizziness (occasional ) and headaches (occasional ).  Hematological: Negative.   Psychiatric/Behavioral: Negative.    Objective:    Physical Exam Vitals and nursing note reviewed.  Constitutional:      Appearance: Normal appearance.  HENT:     Head: Normocephalic and atraumatic.     Nose: Nose normal.     Mouth/Throat:     Mouth: Mucous membranes are dry.     Pharynx: Oropharynx is clear.  Cardiovascular:     Rate and Rhythm: Normal rate and regular rhythm.     Pulses: Normal pulses.     Heart sounds: Normal heart sounds.  Pulmonary:     Effort: Pulmonary effort is normal.     Breath sounds: Normal breath sounds.  Abdominal:     General: Bowel sounds are normal.     Palpations: Abdomen is soft.  Musculoskeletal:        General: Normal range of motion.     Cervical back: Normal range of motion and neck supple.  Skin:    General: Skin is warm.  Neurological:     General: No focal deficit present.     Mental Status: He is alert and oriented to person, place, and time.  Psychiatric:        Mood and Affect: Mood normal.        Behavior: Behavior normal.        Thought Content: Thought content normal.        Judgment: Judgment normal.     BP 124/80   Pulse 63    Ht 5\' 6"  (1.676 m)   Wt 184 lb (83.5 kg)   SpO2 100%   BMI 29.70 kg/m  Wt Readings from Last 3 Encounters:  11/17/20 184 lb (83.5 kg)  07/28/20 188 lb (85.3 kg)  03/27/20 175 lb (79.4 kg)     Health Maintenance Due  Topic Date Due  . Hepatitis C Screening  Never done  . PNEUMOCOCCAL POLYSACCHARIDE VACCINE AGE 39-64 HIGH RISK  Never done  . COVID-19 Vaccine (1) Never done  . OPHTHALMOLOGY EXAM  Never done  . HIV Screening  Never done  . TETANUS/TDAP  Never done  . COLONOSCOPY (Pts 45-62yrs  Insurance coverage will need to be confirmed)  Never done  . FOOT EXAM  08/24/2019  . INFLUENZA VACCINE  Never done    There are no preventive care reminders to display for this patient.  Lab Results  Component Value Date   TSH 1.310 11/17/2020   Lab Results  Component Value Date   WBC 6.3 10/13/2020   HGB 15.4 10/13/2020   HCT 46.5 10/13/2020   MCV 92.8 10/13/2020   PLT 239 10/13/2020   Lab Results  Component Value Date   NA 125 (L) 10/13/2020   K 4.5 10/13/2020   CO2 22 10/13/2020   GLUCOSE 894 (HH) 10/13/2020   BUN 13 10/13/2020   CREATININE 1.00 10/13/2020   BILITOT 0.6 07/28/2020   ALKPHOS 85 07/28/2020   AST 21 07/28/2020   ALT 22 07/28/2020   PROT 7.3 07/28/2020   ALBUMIN 3.9 07/28/2020   CALCIUM 9.3 10/13/2020   ANIONGAP 12 10/13/2020   Lab Results  Component Value Date   CHOL 132 11/17/2020   Lab Results  Component Value Date   HDL 50 11/17/2020   Lab Results  Component Value Date   LDLCALC 71 11/17/2020   Lab Results  Component Value Date   TRIG 48 11/17/2020   Lab Results  Component Value Date   CHOLHDL 2.6 11/17/2020   Lab Results  Component Value Date   HGBA1C 10.2 (A) 11/17/2020   Assessment & Plan:   1. Hospital discharge follow-up  2. Type 2 diabetes mellitus without complication, with long-term current use of insulin ([place]) He will continue medication as prescribed, to decrease foods/beverages high in sugars and carbs and follow  Heart Healthy or DASH diet. Increase physical activity to at least 30 minutes cardio exercise daily.  - POCT glycosylated hemoglobin (Hb A1C) - Microalbumin / creatinine urine ratio - glimepiride (AMARYL) 4 MG tablet; Take 1 tablet (4 mg total) by mouth daily with breakfast.  Dispense: 30 tablet; Refill: 11 - glipiZIDE (GLUCOTROL) 10 MG tablet; Take 1 tablet (10 mg total) by mouth 2 (two) times daily before a meal.  Dispense: 60 tablet; Refill: 11  3. Hemoglobin A1C greater than 9%, indicating poor diabetic control Hgb A1c at 10.2 today. He will begin taking diabetic medications as prescribed. Monitor.   4. Hyperglycemia  5. Hyperlipidemia, unspecified hyperlipidemia type - atorvastatin (LIPITOR) 10 MG tablet; Take 1 tablet (10 mg total) by mouth daily.  Dispense: 30 tablet; Refill: 11  6. Grieving  7. Anxiety - busPIRone (BUSPAR) 5 MG tablet; Take 1 tablet (5 mg total) by mouth 2 (two) times daily.  Dispense: 60 tablet; Refill: 11  8. Screening for colon cancer We will assess at next office visit.   9. Medication refill - atorvastatin (LIPITOR) 10 MG tablet; Take 1 tablet (10 mg total) by mouth daily.  Dispense: 30 tablet; Refill: 11 - busPIRone (BUSPAR) 5 MG tablet; Take 1 tablet (5 mg total) by mouth 2 (two) times daily.  Dispense: 60 tablet; Refill: 11 - glimepiride (AMARYL) 4 MG tablet; Take 1 tablet (4 mg total) by mouth daily with breakfast.  Dispense: 30 tablet; Refill: 11 - glipiZIDE (GLUCOTROL) 10 MG tablet; Take 1 tablet (10 mg total) by mouth 2 (two) times daily before a meal.  Dispense: 60 tablet; Refill: 11 - INS SYRINGE/NEEDLE 1CC/28G 28G X 1/2" 1 ML MISC; 1 applicator by Does not apply route See admin instructions.  Dispense: 100 each; Refill: 11 - insulin glargine (LANTUS) 100 UNIT/ML injection; Inject 0.2 mLs (20 Units  total) into the skin daily.  Dispense: 10 mL; Refill: 11 - insulin lispro (HUMALOG) 100 UNIT/ML injection; Inject 0.3 mLs (30 Units total) into the skin  2 (two) times daily with breakfast and lunch.  Dispense: 10 mL; Refill: 11  10. Healthcare maintenance - Comprehensive metabolic panel; Future - TSH - Lipid Panel - Vitamin B12 - Vitamin D, 25-hydroxy  11. Follow up He will follow up in 3 months.    Meds ordered this encounter  Medications  . atorvastatin (LIPITOR) 10 MG tablet    Sig: Take 1 tablet (10 mg total) by mouth daily.    Dispense:  30 tablet    Refill:  11  . DISCONTD: blood glucose meter kit and supplies KIT    Sig: Dispense based on patient and insurance preference. Check blood sugar up to four times daily or as needed. ICD 10 E11.9    Dispense:  1 each    Refill:  0    Order Specific Question:   Number of strips    Answer:   150    Order Specific Question:   Number of lancets    Answer:   150  . busPIRone (BUSPAR) 5 MG tablet    Sig: Take 1 tablet (5 mg total) by mouth 2 (two) times daily.    Dispense:  60 tablet    Refill:  11  . glimepiride (AMARYL) 4 MG tablet    Sig: Take 1 tablet (4 mg total) by mouth daily with breakfast.    Dispense:  30 tablet    Refill:  11  . glipiZIDE (GLUCOTROL) 10 MG tablet    Sig: Take 1 tablet (10 mg total) by mouth 2 (two) times daily before a meal.    Dispense:  60 tablet    Refill:  11  . INS SYRINGE/NEEDLE 1CC/28G 28G X 1/2" 1 ML MISC    Sig: 1 applicator by Does not apply route See admin instructions.    Dispense:  100 each    Refill:  11  . insulin glargine (LANTUS) 100 UNIT/ML injection    Sig: Inject 0.2 mLs (20 Units total) into the skin daily.    Dispense:  10 mL    Refill:  11  . insulin lispro (HUMALOG) 100 UNIT/ML injection    Sig: Inject 0.3 mLs (30 Units total) into the skin 2 (two) times daily with breakfast and lunch.    Dispense:  10 mL    Refill:  11    Orders Placed This Encounter  Procedures  . Microalbumin / creatinine urine ratio  . Comprehensive metabolic panel  . TSH  . Lipid Panel  . Vitamin B12  . Vitamin D, 25-hydroxy  . POCT  glycosylated hemoglobin (Hb A1C)    Referral Orders  No referral(s) requested today    Kathe Becton, MSN, ANE, FNP-BC [address], [place] [address] [phone] fax   Problem List Items Addressed This Visit      Endocrine   Hemoglobin A1C greater than 9%, indicating poor diabetic control   Relevant Medications   atorvastatin (LIPITOR) 10 MG tablet   glimepiride (AMARYL) 4 MG tablet   glipiZIDE (GLUCOTROL) 10 MG tablet   insulin glargine (LANTUS) 100 UNIT/ML injection   insulin lispro (HUMALOG) 100 UNIT/ML injection   Type 2 diabetes mellitus without complication, with long-term current use of insulin ([facility])   Relevant Medications   atorvastatin (LIPITOR)  10 MG tablet   glimepiride (AMARYL) 4 MG tablet   glipiZIDE (GLUCOTROL) 10 MG tablet   insulin glargine (LANTUS) 100 UNIT/ML injection   insulin lispro (HUMALOG) 100 UNIT/ML injection   Other Relevant Orders   POCT glycosylated hemoglobin (Hb A1C) (Completed)   Microalbumin / creatinine urine ratio (Completed)     Other   Hyperglycemia   Hyperlipidemia   Relevant Medications   atorvastatin (LIPITOR) 10 MG tablet    Other Visit Diagnoses    Hospital discharge follow-up    -  Primary   Grieving       Anxiety       Relevant Medications   busPIRone (BUSPAR) 5 MG tablet   Screening for colon cancer       Medication refill       Relevant Medications   atorvastatin (LIPITOR) 10 MG tablet   busPIRone (BUSPAR) 5 MG tablet   glimepiride (AMARYL) 4 MG tablet   glipiZIDE (GLUCOTROL) 10 MG tablet   INS SYRINGE/NEEDLE 1CC/28G 28G X 1/2" 1 ML MISC   insulin glargine (LANTUS) 100 UNIT/ML injection   insulin lispro (HUMALOG) 100 UNIT/ML injection   Healthcare maintenance       Relevant Orders   Comprehensive metabolic panel   TSH (Completed)   Lipid Panel (Completed)   Vitamin B12 (Completed)    Vitamin D, 25-hydroxy (Completed)   Follow up          Meds ordered this encounter  Medications  . atorvastatin (LIPITOR) 10 MG tablet    Sig: Take 1 tablet (10 mg total) by mouth daily.    Dispense:  30 tablet    Refill:  11  . DISCONTD: blood glucose meter kit and supplies KIT    Sig: Dispense based on patient and insurance preference. Check blood sugar up to four times daily or as needed. ICD 10 E11.9    Dispense:  1 each    Refill:  0    Order Specific Question:   Number of strips    Answer:   150    Order Specific Question:   Number of lancets    Answer:   150  . busPIRone (BUSPAR) 5 MG tablet    Sig: Take 1 tablet (5 mg total) by mouth 2 (two) times daily.    Dispense:  60 tablet    Refill:  11  . glimepiride (AMARYL) 4 MG tablet    Sig: Take 1 tablet (4 mg total) by mouth daily with breakfast.    Dispense:  30 tablet    Refill:  11  . glipiZIDE (GLUCOTROL) 10 MG tablet    Sig: Take 1 tablet (10 mg total) by mouth 2 (two) times daily before a meal.    Dispense:  60 tablet    Refill:  11  . INS SYRINGE/NEEDLE 1CC/28G 28G X 1/2" 1 ML MISC    Sig: 1 applicator by Does not apply route See admin instructions.    Dispense:  100 each    Refill:  11  . insulin glargine (LANTUS) 100 UNIT/ML injection    Sig: Inject 0.2 mLs (20 Units total) into the skin daily.    Dispense:  10 mL    Refill:  11  . insulin lispro (HUMALOG) 100 UNIT/ML injection    Sig: Inject 0.3 mLs (30 Units total) into the skin 2 (two) times daily with breakfast and lunch.    Dispense:  10 mL    Refill:  11    Follow-up: No  follow-ups on file.    Azzie Glatter, FNP

## 2020-11-18 ENCOUNTER — Other Ambulatory Visit: Payer: Self-pay

## 2020-11-18 DIAGNOSIS — Z760 Encounter for issue of repeat prescription: Secondary | ICD-10-CM

## 2020-11-18 LAB — LIPID PANEL
Chol/HDL Ratio: 2.6 ratio (ref 0.0–5.0)
Cholesterol, Total: 132 mg/dL (ref 100–199)
HDL: 50 mg/dL (ref >39)
LDL Chol Calc (NIH): 71 mg/dL (ref 0–99)
Triglycerides: 48 mg/dL (ref 0–149)
VLDL Cholesterol Cal: 11 mg/dL (ref 5–40)

## 2020-11-18 LAB — VITAMIN D 25 HYDROXY (VIT D DEFICIENCY, FRACTURES): Vit D, 25-Hydroxy: 13.1 ng/mL — ABNORMAL LOW (ref 30.0–100.0)

## 2020-11-18 LAB — TSH: TSH: 1.31 u[IU]/mL (ref 0.450–4.500)

## 2020-11-18 LAB — MICROALBUMIN / CREATININE URINE RATIO
Creatinine, Urine: 222 mg/dL
Microalb/Creat Ratio: 3 mg/g creat (ref 0–29)
Microalbumin, Urine: 7.5 ug/mL

## 2020-11-18 LAB — VITAMIN B12: Vitamin B-12: 378 pg/mL (ref 232–1245)

## 2020-11-18 MED ORDER — BLOOD GLUCOSE MONITOR KIT: PACK | 0 refills | Status: DC

## 2020-11-23 ENCOUNTER — Encounter: Payer: Self-pay | Admitting: Family Medicine

## 2020-11-25 ENCOUNTER — Encounter: Payer: Self-pay | Admitting: Family Medicine

## 2020-11-25 ENCOUNTER — Other Ambulatory Visit: Payer: Self-pay | Admitting: Family Medicine

## 2020-11-25 DIAGNOSIS — E559 Vitamin D deficiency, unspecified: Secondary | ICD-10-CM

## 2020-11-25 MED ORDER — VITAMIN D (ERGOCALCIFEROL) 1.25 MG (50000 UNIT) PO CAPS: 50000.0000 [IU] | ORAL_CAPSULE | ORAL | 6 refills | Status: DC

## 2020-11-30 MED FILL — ?glipiZIDE 10MG TABLETS: 10 | 30 days supply | Qty: 60 | Fill #0

## 2020-11-30 MED FILL — ?ATORVASTATIN 10 MG TABLET: 10 | 30 days supply | Qty: 30 | Fill #0

## 2020-11-30 MED FILL — ?HUMALOG 100 UNITS/ML VIAL: 100 | 32 days supply | Qty: 20 | Fill #0

## 2020-11-30 MED FILL — GLIMEPIRIDE 4 MG TABS: 4 | 30 days supply | Qty: 30 | Fill #0

## 2020-11-30 MED FILL — TRUEPLUS SYR 1ML 31GX5/16: 31G X 5/16" | 30 days supply | Qty: 100 | Fill #0

## 2020-11-30 MED FILL — busPIRone HCL 5 MG TABS: 5 | 30 days supply | Qty: 60 | Fill #0

## 2020-11-30 MED FILL — LANTUS 100 UNITS/ML VIAL: 100 | 28 days supply | Qty: 10 | Fill #1

## 2020-12-01 ENCOUNTER — Ambulatory Visit (HOSPITAL_COMMUNITY): Payer: Self-pay | Admitting: Psychiatry

## 2020-12-07 IMAGING — CR DG ANKLE COMPLETE 3+V*L*
3 series · 3 of 3 positions shown · non-contrast
Comparison: None.

CLINICAL DATA: Fall last night

EXAM:
LEFT ANKLE COMPLETE - 3+ VIEW

[ankle ap]
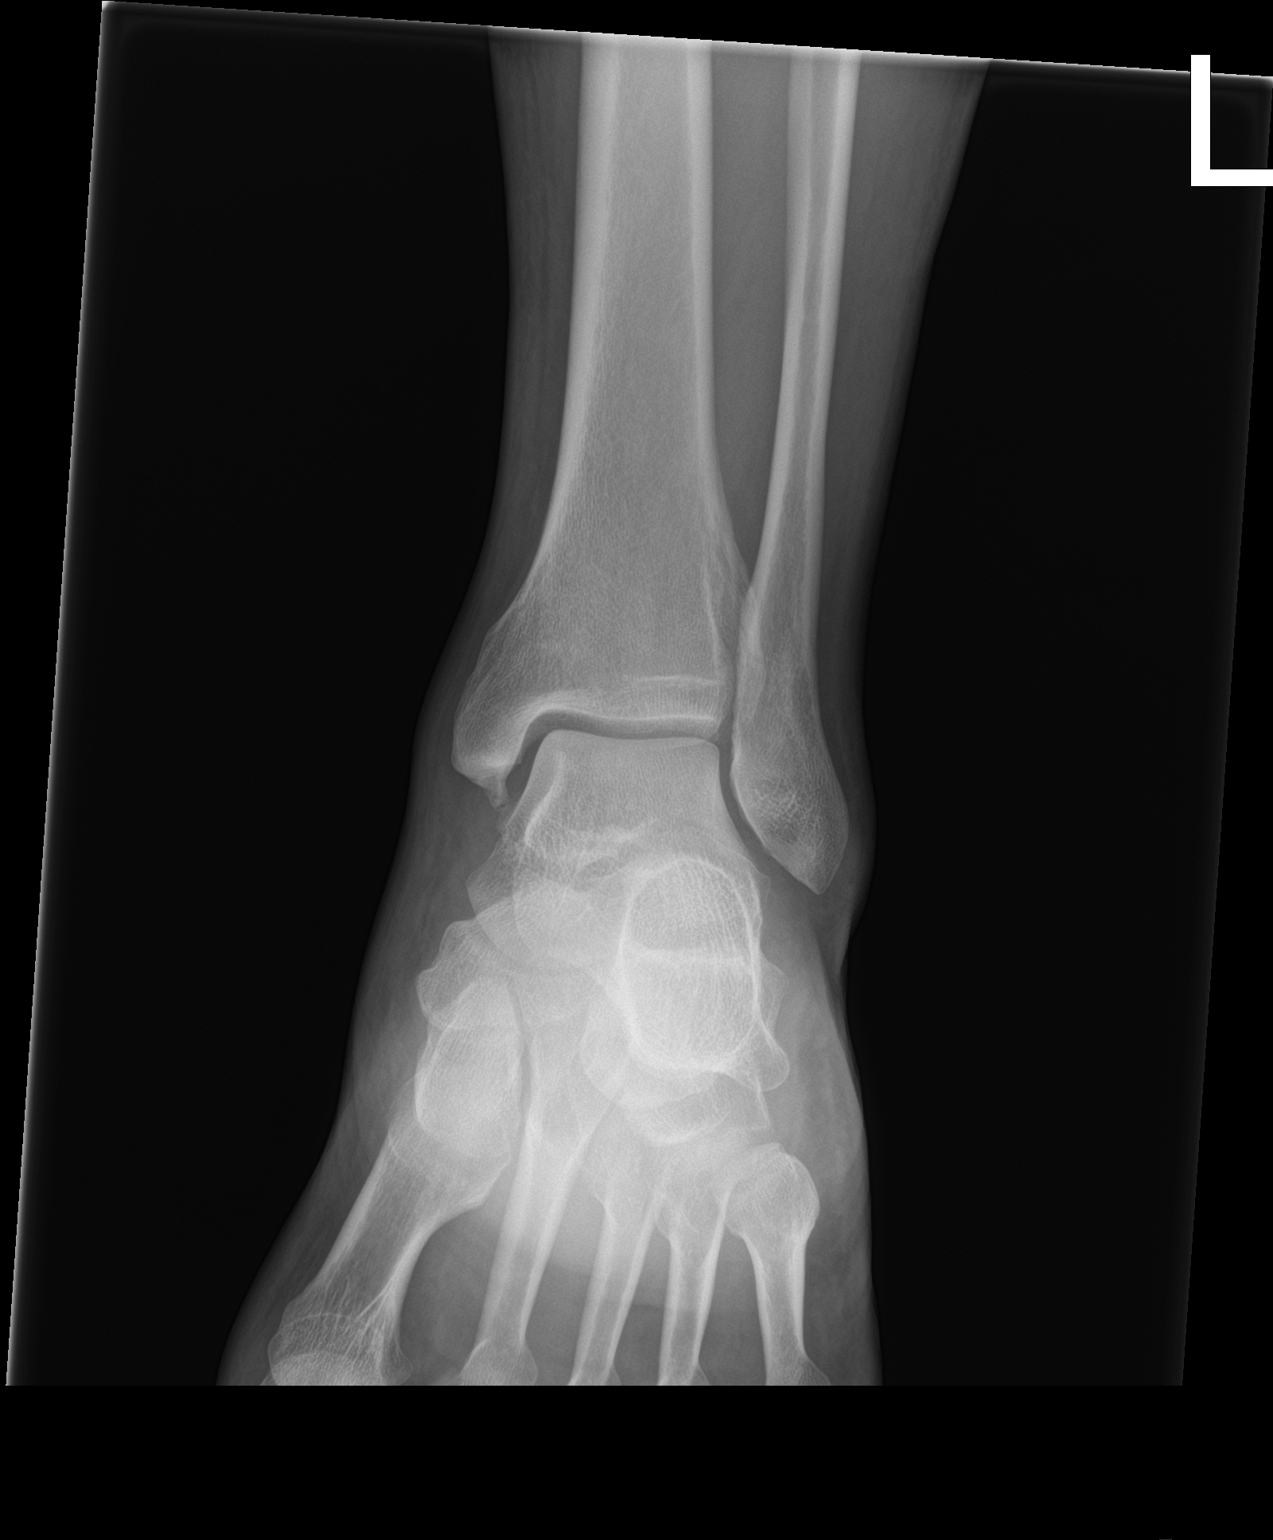

[ankle obl]
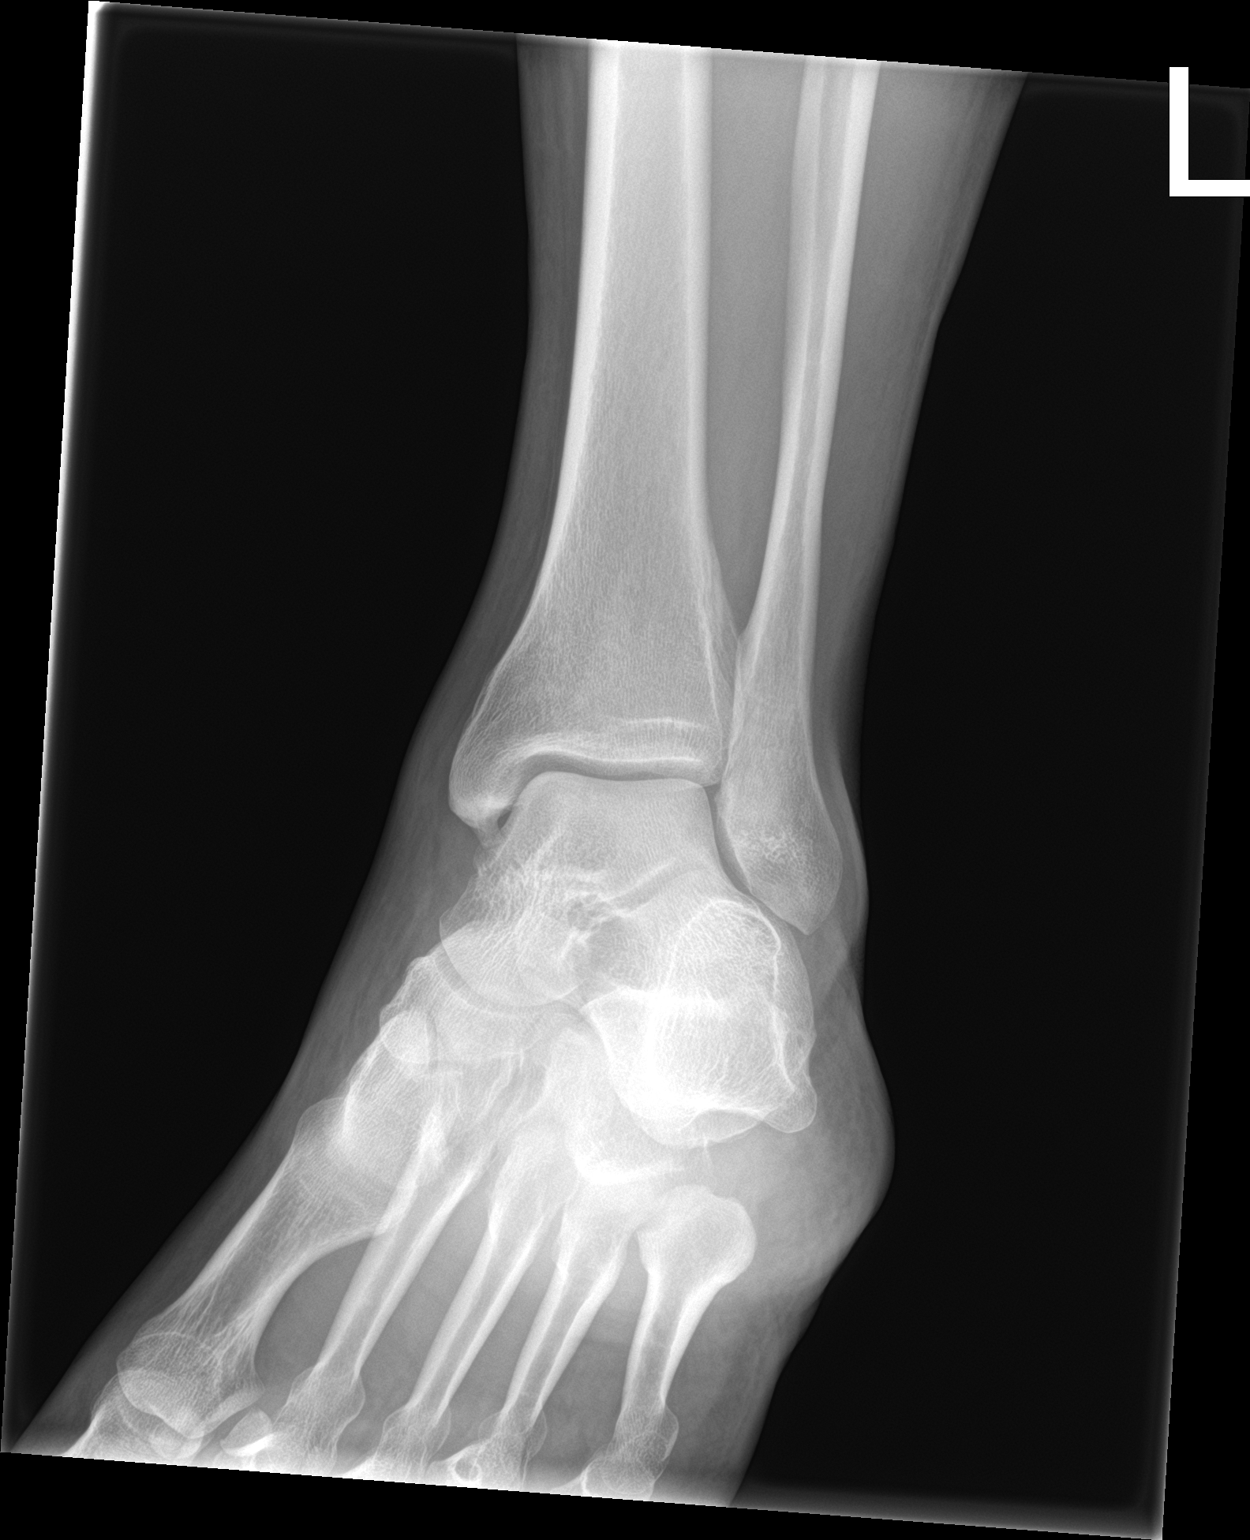

[ankle lat]
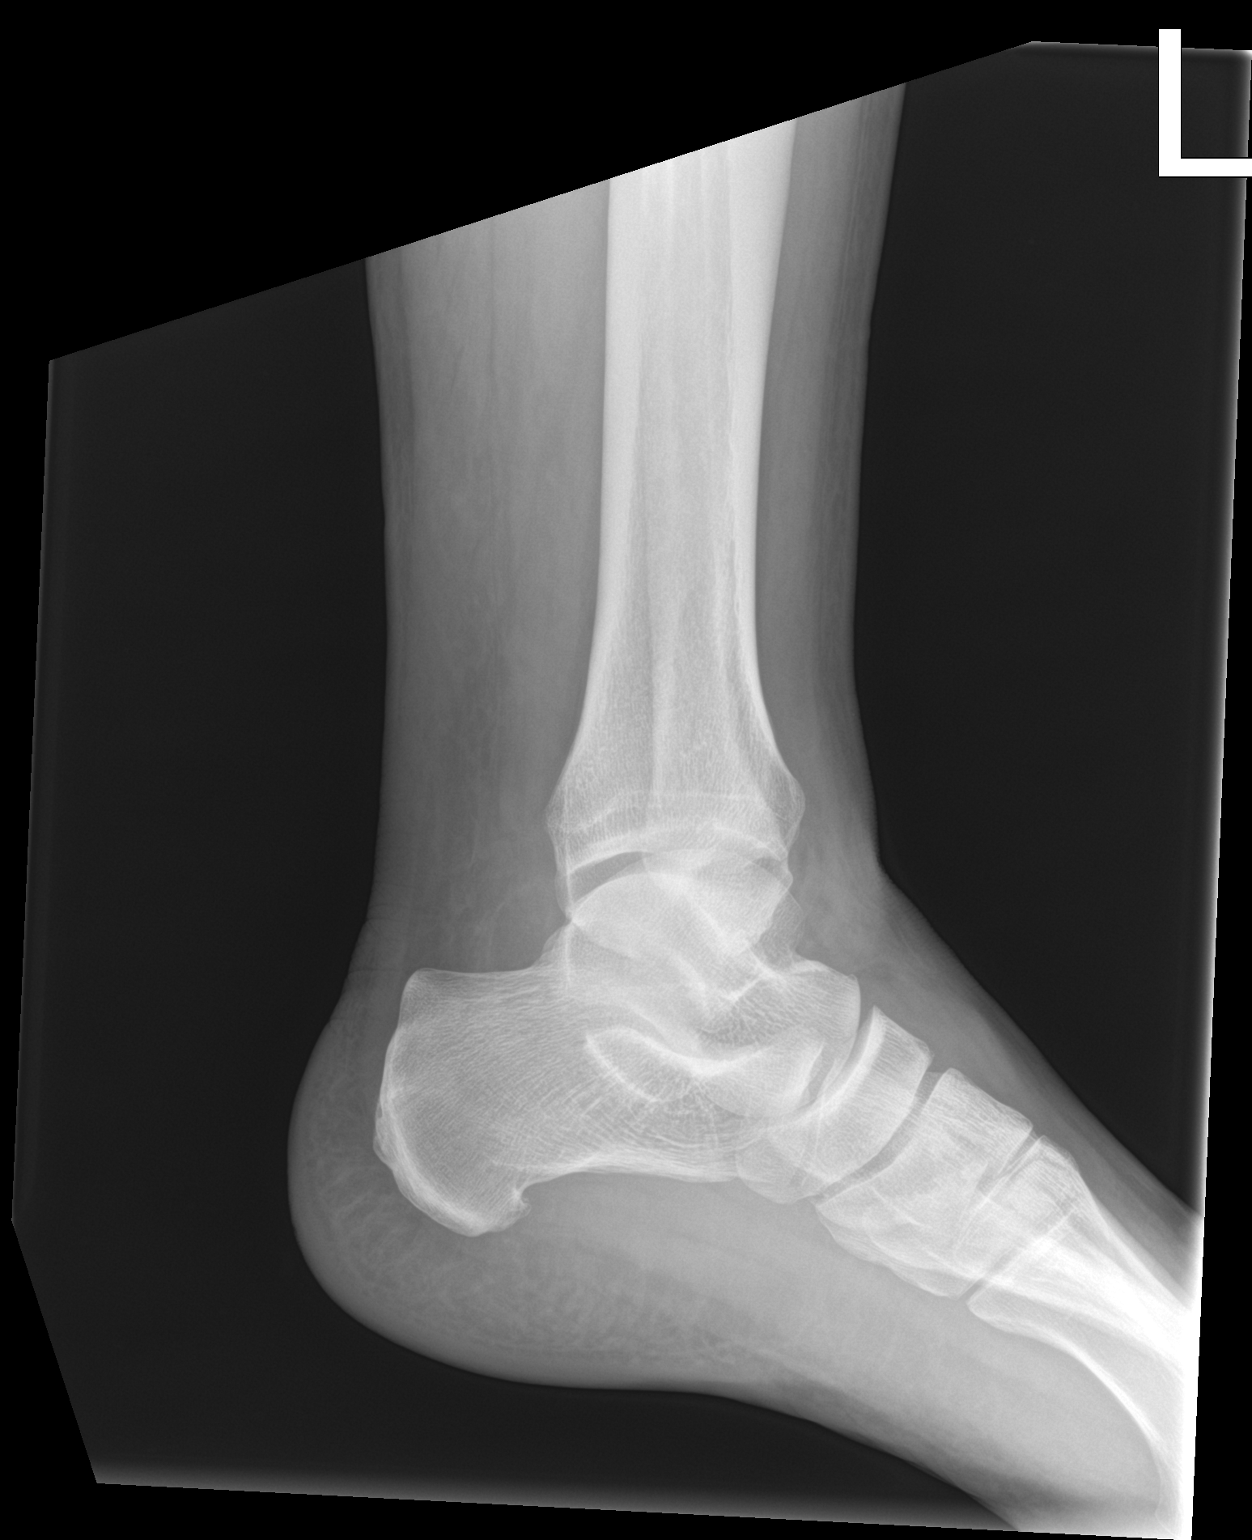

[3 of 3 positions shown; findings below may reference images not displayed]

FINDINGS: Negative for fracture. Mild spurring of the medial malleolus
consistent with old injury. Mild soft tissue swelling. Possible
joint effusion.
IMPRESSION: Negative for fracture.  Possible joint effusion.

## 2021-01-14 ENCOUNTER — Other Ambulatory Visit: Payer: Self-pay

## 2021-02-02 ENCOUNTER — Other Ambulatory Visit: Payer: Self-pay

## 2021-02-09 ENCOUNTER — Inpatient Hospital Stay: Payer: Self-pay | Admitting: Family Medicine

## 2021-02-09 ENCOUNTER — Ambulatory Visit: Payer: Self-pay | Admitting: Nurse Practitioner

## 2021-02-14 ENCOUNTER — Other Ambulatory Visit: Payer: Self-pay

## 2021-02-14 MED FILL — Insulin Lispro Inj Soln 100 Unit/ML: INTRAMUSCULAR | Qty: 10 | Fill #0 | Status: CN

## 2021-02-14 MED FILL — Insulin Lispro Inj Soln 100 Unit/ML: INTRAMUSCULAR | 16 days supply | Qty: 10 | Fill #0 | Status: AC

## 2021-02-15 ENCOUNTER — Other Ambulatory Visit: Payer: Self-pay

## 2021-02-16 ENCOUNTER — Other Ambulatory Visit: Payer: Self-pay

## 2021-03-22 ENCOUNTER — Emergency Department (HOSPITAL_COMMUNITY)
Admission: EM | Admit: 2021-03-22 | Discharge: 2021-03-22 | Disposition: A | Discharge status: Home / Self Care | Payer: Self-pay | Attending: Emergency Medicine | Admitting: Emergency Medicine

## 2021-03-22 ENCOUNTER — Encounter (HOSPITAL_COMMUNITY): Payer: Self-pay

## 2021-03-22 ENCOUNTER — Other Ambulatory Visit: Payer: Self-pay

## 2021-03-22 DIAGNOSIS — R739 Hyperglycemia, unspecified: Secondary | ICD-10-CM

## 2021-03-22 DIAGNOSIS — Z794 Long term (current) use of insulin: Secondary | ICD-10-CM | POA: Insufficient documentation

## 2021-03-22 DIAGNOSIS — E1110 Type 2 diabetes mellitus with ketoacidosis without coma: Secondary | ICD-10-CM | POA: Insufficient documentation

## 2021-03-22 DIAGNOSIS — Z7984 Long term (current) use of oral hypoglycemic drugs: Secondary | ICD-10-CM | POA: Insufficient documentation

## 2021-03-22 DIAGNOSIS — F17210 Nicotine dependence, cigarettes, uncomplicated: Secondary | ICD-10-CM | POA: Insufficient documentation

## 2021-03-22 DIAGNOSIS — E1165 Type 2 diabetes mellitus with hyperglycemia: Secondary | ICD-10-CM | POA: Insufficient documentation

## 2021-03-22 LAB — CBC
HCT: 46 % (ref 39.0–52.0)
Hemoglobin: 15 g/dL (ref 13.0–17.0)
MCH: 29.7 pg (ref 26.0–34.0)
MCHC: 32.6 g/dL (ref 30.0–36.0)
MCV: 91.1 fL (ref 80.0–100.0)
Platelets: 248 10*3/uL (ref 150–400)
RBC: 5.05 MIL/uL (ref 4.22–5.81)
RDW: 14.2 % (ref 11.5–15.5)
WBC: 10.3 10*3/uL (ref 4.0–10.5)
nRBC: 0 % (ref 0.0–0.2)

## 2021-03-22 LAB — HEMOGLOBIN A1C
Hgb A1c MFr Bld: 9.7 % — ABNORMAL HIGH (ref 4.8–5.6)
Mean Plasma Glucose: 231.69 mg/dL

## 2021-03-22 LAB — URINALYSIS, ROUTINE W REFLEX MICROSCOPIC
Bacteria, UA: NONE SEEN
Bilirubin Urine: NEGATIVE
Glucose, UA: >=500 mg/dL — AB
Hgb urine dipstick: NEGATIVE
Ketones, ur: 80 mg/dL — AB
Leukocytes,Ua: NEGATIVE
Nitrite: NEGATIVE
Protein, ur: NEGATIVE mg/dL
Specific Gravity, Urine: 1.032 — ABNORMAL HIGH (ref 1.005–1.030)
pH: 5 (ref 5.0–8.0)

## 2021-03-22 LAB — BASIC METABOLIC PANEL
Anion gap: 11 (ref 5–15)
BUN: 13 mg/dL (ref 6–20)
CO2: 23 mmol/L (ref 22–32)
Calcium: 9.2 mg/dL (ref 8.9–10.3)
Chloride: 101 mmol/L (ref 98–111)
Creatinine, Ser: 1.12 mg/dL (ref 0.61–1.24)
GFR, Estimated: >60 mL/min (ref >60)
Glucose, Bld: 473 mg/dL — ABNORMAL HIGH (ref 70–99)
Potassium: 4.4 mmol/L (ref 3.5–5.1)
Sodium: 135 mmol/L (ref 135–145)

## 2021-03-22 LAB — CBG MONITORING, ED
Glucose-Capillary: 361 mg/dL — ABNORMAL HIGH (ref 70–99)
Glucose-Capillary: 470 mg/dL — ABNORMAL HIGH (ref 70–99)
Glucose-Capillary: 281 mg/dL — ABNORMAL HIGH (ref 70–99)

## 2021-03-22 MED ORDER — SODIUM CHLORIDE 0.9 % IV BOLUS
1000.0000 mL | Freq: Once | INTRAVENOUS | Status: AC
Start: 2021-03-22 — End: 2021-03-22
  Administered 2021-03-22: 1000 mL via INTRAVENOUS

## 2021-03-22 MED ORDER — INSULIN ASPART 100 UNIT/ML IJ SOLN
10.0000 [IU] | Freq: Once | INTRAMUSCULAR | Status: AC
  Administered 2021-03-22: 10 [IU] via INTRAVENOUS
  Filled 2021-03-22: qty 0.1

## 2021-03-22 NOTE — ED Triage Notes (Signed)
Patient states his CBG at home was 328, but for the past 2 days his CBG was in the 400's. Patient denies any N/V or abdominal, but states urinary frequency.

## 2021-03-22 NOTE — Discharge Instructions (Signed)
You were seen in the emergency department for evaluation of frequent urination and elevated blood sugars.  Your blood sugar was elevated.  Your symptoms improved with some IV fluids and some insulin.  Please follow-up with your doctor as scheduled.  Continue your regular medications.  Return if any worsening or concerning symptoms

## 2021-03-22 NOTE — ED Provider Notes (Signed)
[facility] DEPT Provider Note   CSN: [id number] Arrival date & time: 03/22/21  1049     History Chief Complaint  Patient presents with   Hyperglycemia    Dakota Kim is a 50 y.o. male.  He has a history of diabetes and states has been compliant with his medications.  He is also been trying to keep a better diet.  He is complaining of elevated blood sugars for 3 days and urinary frequency.  He denies any fever or pain.  No cough shortness of breath vomiting diarrhea.  The history is provided by the patient.  Hyperglycemia Blood sugar level PTA:  470 Onset quality:  Gradual Duration:  3 days Timing:  Constant Progression:  Unchanged Chronicity:  Recurrent Diabetes status:  Controlled with oral medications and controlled with insulin Current diabetic therapy:  Glimepiride, glipizide, lantus, humalog Relieved by:  Nothing Ineffective treatments:  Insulin and oral agents Associated symptoms: fatigue, increased thirst and polyuria   Associated symptoms: no abdominal pain, no blurred vision, no chest pain, no dysuria, no fever, no shortness of breath, no syncope and no vomiting       Past Medical History:  Diagnosis Date   Diabetes mellitus without complication ([place])    Vitamin D deficiency 09/2019    Patient Active Problem List   Diagnosis Date Noted   Hyperglycemia 09/24/2019   Hemoglobin A1C greater than 9%, indicating poor diabetic control 09/24/2019   Hyperlipidemia 02/26/2019   Class 2 severe obesity due to excess calories with serious comorbidity and body mass index (BMI) of 35.0 to 35.9 in adult [facility]) 02/26/2019   Type 2 diabetes mellitus without complication, with long-term current use of insulin ([place]) 05/13/2017   DKA (diabetic ketoacidoses) 11/06/2012   Hypokalemia 11/06/2012    History reviewed. No pertinent surgical history.     Family History  Problem Relation Age of Onset   Diabetes Mellitus II Father     Social  History   Tobacco Use   Smoking status: Every Day    Packs/day: 0.15    Pack years: 0.00    Types: Cigarettes    Last attempt to quit: 10/08/2012    Years since quitting: 8.4   Smokeless tobacco: Never  Vaping Use   Vaping Use: Never used  Substance Use Topics   Alcohol use: Yes    Comment: occassionally   Drug use: No    Home Medications Prior to Admission medications   Medication Sig Start Date End Date Taking? Authorizing Provider  atorvastatin (LIPITOR) 10 MG tablet Take 1 tablet (10 mg total) by mouth daily. 11/17/20 11/12/21  Azzie Glatter, FNP  atorvastatin (LIPITOR) 10 MG tablet TAKE 1 TABLET (10 MG TOTAL) BY MOUTH DAILY. 11/17/20 11/17/21  Azzie Glatter, FNP  blood glucose meter kit and supplies KIT Dispense based on patient and insurance preference. Check blood sugar up to four times daily or as needed. ICD 10 E11.9 11/18/20   Azzie Glatter, FNP  busPIRone (BUSPAR) 5 MG tablet Take 1 tablet (5 mg total) by mouth 2 (two) times daily. 11/17/20   Azzie Glatter, FNP  busPIRone (BUSPAR) 5 MG tablet TAKE 1 TABLET (5 MG TOTAL) BY MOUTH 2 (TWO) TIMES DAILY. 11/17/20 11/17/21  Azzie Glatter, FNP  glimepiride (AMARYL) 4 MG tablet Take 1 tablet (4 mg total) by mouth daily with breakfast. 11/17/20   Azzie Glatter, FNP  glimepiride (AMARYL) 4 MG tablet TAKE 1 TABLET (4 MG TOTAL) BY MOUTH DAILY  WITH BREAKFAST. 11/17/20 11/17/21  Azzie Glatter, FNP  glipiZIDE (GLUCOTROL) 10 MG tablet Take 1 tablet (10 mg total) by mouth 2 (two) times daily before a meal. 11/17/20   Azzie Glatter, FNP  glipiZIDE (GLUCOTROL) 10 MG tablet TAKE 1 TABLET (10 MG TOTAL) BY MOUTH 2 (TWO) TIMES DAILY BEFORE A MEAL. 11/17/20 11/17/21  Azzie Glatter, FNP  INS SYRINGE/NEEDLE 1CC/28G 28G X 1/2" 1 ML MISC 1 applicator by Does not apply route See admin instructions. 11/17/20   Azzie Glatter, FNP  insulin glargine (LANTUS) 100 UNIT/ML injection Inject 0.2 mLs (20 Units total) into the skin daily. 11/17/20    Azzie Glatter, FNP  insulin glargine (LANTUS) 100 UNIT/ML injection INJECT 0.2 MLS (20 UNITS TOTAL) INTO THE SKIN DAILY. 11/17/20 11/17/21  Azzie Glatter, FNP  insulin glargine (LANTUS) 100 UNIT/ML injection INJECT 0.2 MLS (20 UNITS TOTAL) INTO THE SKIN DAILY. 11/02/20 11/02/21  Azzie Glatter, FNP  insulin lispro (HUMALOG) 100 UNIT/ML injection Inject 0.3 mLs (30 Units total) into the skin 2 (two) times daily with breakfast and lunch. 11/17/20   Azzie Glatter, FNP  insulin lispro (HUMALOG) 100 UNIT/ML injection INJECT 0.3 MLS (30 UNITS TOTAL) INTO THE SKIN 2 (TWO) TIMES DAILY WITH BREAKFAST AND LUNCH. 11/17/20 11/17/21  Azzie Glatter, FNP  insulin lispro (HUMALOG) 100 UNIT/ML injection INJECT 0.45 MLS (45 UNITS TOTAL) INTO THE SKIN 2 (TWO) TIMES DAILY WITH BREAKFAST AND LUNCH. 11/02/20 11/02/21  Azzie Glatter, FNP  Insulin Syringe-Needle U-100 31G X 5/16" 1 ML MISC USE AS DIRECTED 11/17/20 11/17/21  Azzie Glatter, FNP  Vitamin D, Ergocalciferol, (DRISDOL) 1.25 MG (50000 UNIT) CAPS capsule Take 1 capsule (50,000 Units total) by mouth every 7 (seven) days. 11/25/20   Azzie Glatter, FNP  Vitamin D, Ergocalciferol, (DRISDOL) 1.25 MG (50000 UNIT) CAPS capsule TAKE 1 CAPSULE (50,000 UNITS TOTAL) BY MOUTH EVERY 7 (SEVEN) DAYS. 11/25/20 11/25/21  Azzie Glatter, FNP  ferrous sulfate 325 (65 FE) MG tablet Take 1 tablet (325 mg total) by mouth daily. Patient not taking: Reported on 09/23/2019 08/28/18 07/28/20  Azzie Glatter, FNP  Insulin Lispro Prot & Lispro (HUMALOG MIX 75/25 KWIKPEN) (75-25) 100 UNIT/ML Kwikpen Inject 45 Units into the skin 2 (two) times daily with breakfast and lunch. 10/13/20 10/13/20  Tedd Sias, PA    Allergies    Patient has no known allergies.  Review of Systems   Review of Systems  Constitutional:  Positive for fatigue. Negative for fever.  HENT:  Negative for sore throat.   Eyes:  Negative for blurred vision and visual disturbance.  Respiratory:  Negative  for shortness of breath.   Cardiovascular:  Negative for chest pain and syncope.  Gastrointestinal:  Negative for abdominal pain and vomiting.  Endocrine: Positive for polydipsia and polyuria.  Genitourinary:  Positive for frequency. Negative for dysuria.  Musculoskeletal:  Negative for joint swelling.  Skin:  Negative for rash.  Neurological:  Negative for headaches.   Physical Exam Updated Vital Signs BP (!) 164/117 (BP Location: Left Arm)   Pulse (!) 104   Temp 98.5 F (36.9 C) (Oral)   Resp 18   Ht '5\' 6"'  (1.676 m)   Wt 79.4 kg   SpO2 98%   BMI 28.25 kg/m   Physical Exam Vitals and nursing note reviewed.  Constitutional:      Appearance: Normal appearance. He is well-developed.  HENT:     Head: Normocephalic and atraumatic.  Eyes:  Conjunctiva/sclera: Conjunctivae normal.  Cardiovascular:     Rate and Rhythm: Normal rate and regular rhythm.     Heart sounds: No murmur heard. Pulmonary:     Effort: Pulmonary effort is normal. No respiratory distress.     Breath sounds: Normal breath sounds.  Abdominal:     Palpations: Abdomen is soft.     Tenderness: There is no abdominal tenderness.  Musculoskeletal:        General: No deformity or signs of injury. Normal range of motion.     Cervical back: Neck supple.  Skin:    General: Skin is warm and dry.  Neurological:     General: No focal deficit present.     Mental Status: He is alert.    ED Results / Procedures / Treatments   Labs (all labs ordered are listed, but only abnormal results are displayed) Labs Reviewed  BASIC METABOLIC PANEL - Abnormal; Notable for the following components:      Result Value   Glucose, Bld 473 (*)    All other components within normal limits  URINALYSIS, ROUTINE W REFLEX MICROSCOPIC - Abnormal; Notable for the following components:   Color, Urine STRAW (*)    Specific Gravity, Urine 1.032 (*)    Glucose, UA >=500 (*)    Ketones, ur 80 (*)    All other components within normal  limits  HEMOGLOBIN A1C - Abnormal; Notable for the following components:   Hgb A1c MFr Bld 9.7 (*)    All other components within normal limits  CBG MONITORING, ED - Abnormal; Notable for the following components:   Glucose-Capillary 361 (*)    All other components within normal limits  CBG MONITORING, ED - Abnormal; Notable for the following components:   Glucose-Capillary 470 (*)    All other components within normal limits  CBG MONITORING, ED - Abnormal; Notable for the following components:   Glucose-Capillary 281 (*)    All other components within normal limits  CBC  CBG MONITORING, ED    EKG None  Radiology No results found.  Procedures Procedures   Medications Ordered in ED Medications  insulin aspart (novoLOG) injection 10 Units (has no administration in time range)  sodium chloride 0.9 % bolus 1,000 mL (has no administration in time range)    ED Course  I have reviewed the triage vital signs and the nursing notes.  Pertinent labs & imaging results that were available during my care of the patient were reviewed by me and considered in my medical decision making (see chart for details).    MDM Rules/Calculators/A&P                         Dakota Kim was evaluated in Emergency Department on 03/22/2021 for the symptoms described in the history of present illness. He was evaluated in the context of the global COVID-19 pandemic, which necessitated consideration that the patient might be at risk for infection with the SARS-CoV-2 virus that causes COVID-19. Institutional protocols and algorithms that pertain to the evaluation of patients at risk for COVID-19 are in a state of rapid change based on information released by regulatory bodies including the CDC and federal and state organizations. These policies and algorithms were followed during the patient's care in the ED.  This patient complains of elevated blood sugar and frequent urination; this involves an extensive  number of treatment Options and is a complaint that carries with it a high risk of complications and  Morbidity. The differential includes hyperglycemia, DKA, dehydration, renal failure, UTI, metabolic derangement  I ordered, reviewed and interpreted labs, which included CBC which shows a normal hemoglobin normal white count, chemistries fairly normal other than elevated glucose, normal gap, urinalysis with ketones and glucose without signs of infection I ordered medication IV fluids and IV insulin with improvement in his blood sugar Previous records obtained and reviewed in epic, patient has a few visits for hypoglycemic episodes  After the interventions stated above, I reevaluated the patient and found patient symptomatically feels improved.  He has an appointment with his primary care doctor in 2 days.  He is comfortable managing his symptoms and following up with his doctor.  Return instructions discussed   Final Clinical Impression(s) / ED Diagnoses Final diagnoses:  Hyperglycemia    Rx / DC Orders ED Discharge Orders     None        Hayden Rasmussen, MD 03/22/21 1715

## 2021-03-24 ENCOUNTER — Other Ambulatory Visit: Payer: Self-pay

## 2021-03-24 ENCOUNTER — Ambulatory Visit (INDEPENDENT_AMBULATORY_CARE_PROVIDER_SITE_OTHER): Payer: Self-pay | Admitting: Nurse Practitioner

## 2021-03-24 ENCOUNTER — Encounter: Payer: Self-pay | Admitting: Nurse Practitioner

## 2021-03-24 VITALS — BP 131/77 | HR 73 | Temp 98.4°F | Ht 66.0 in | Wt 182.0 lb

## 2021-03-24 DIAGNOSIS — Z114 Encounter for screening for human immunodeficiency virus [HIV]: Secondary | ICD-10-CM

## 2021-03-24 DIAGNOSIS — Z23 Encounter for immunization: Secondary | ICD-10-CM

## 2021-03-24 DIAGNOSIS — Z1159 Encounter for screening for other viral diseases: Secondary | ICD-10-CM

## 2021-03-24 DIAGNOSIS — Z125 Encounter for screening for malignant neoplasm of prostate: Secondary | ICD-10-CM

## 2021-03-24 DIAGNOSIS — Z794 Long term (current) use of insulin: Secondary | ICD-10-CM

## 2021-03-24 DIAGNOSIS — N529 Male erectile dysfunction, unspecified: Secondary | ICD-10-CM

## 2021-03-24 DIAGNOSIS — Z13220 Encounter for screening for lipoid disorders: Secondary | ICD-10-CM

## 2021-03-24 DIAGNOSIS — E119 Type 2 diabetes mellitus without complications: Secondary | ICD-10-CM

## 2021-03-24 LAB — POCT URINALYSIS DIPSTICK
Bilirubin, UA: NEGATIVE
Blood, UA: NEGATIVE
Glucose, UA: POSITIVE — AB
Ketones, UA: NEGATIVE
Leukocytes, UA: NEGATIVE
Nitrite, UA: NEGATIVE
Protein, UA: NEGATIVE
Spec Grav, UA: 1.015 (ref 1.010–1.025)
Urobilinogen, UA: 0.2 E.U./dL
pH, UA: 5.5 (ref 5.0–8.0)

## 2021-03-24 LAB — POCT GLYCOSYLATED HEMOGLOBIN (HGB A1C): Hemoglobin A1C: 9.6 % — AB (ref 4.0–5.6)

## 2021-03-24 LAB — GLUCOSE, POCT (MANUAL RESULT ENTRY): POC Glucose: 367 mg/dl — AB (ref 70–99)

## 2021-03-24 MED ORDER — INSULIN GLARGINE 100 UNIT/ML SOLOSTAR PEN
20.0000 [IU] | PEN_INJECTOR | Freq: Every day | SUBCUTANEOUS | 11 refills | Status: DC
  Filled 2021-03-24: qty 6, 30d supply, fill #0
  Filled 2021-05-02: qty 6, 30d supply, fill #1
  Filled 2021-09-13: qty 6, 30d supply, fill #2
  Filled 2021-09-21: qty 6, 30d supply, fill #0
  Filled 2021-10-13 – 2021-10-17 (×3): qty 6, 30d supply, fill #1
  Filled 2021-12-01 (×2): qty 6, 30d supply, fill #2
  Filled 2022-01-27 – 2022-01-28 (×2): qty 6, 30d supply, fill #3

## 2021-03-24 MED ORDER — INSULIN LISPRO (1 UNIT DIAL) 100 UNIT/ML (KWIKPEN)
30.0000 [IU] | PEN_INJECTOR | Freq: Two times a day (BID) | SUBCUTANEOUS | 11 refills | Status: DC
  Filled 2021-03-24: qty 15, 25d supply, fill #0
  Filled 2021-04-24 – 2021-05-02 (×2): qty 15, 25d supply, fill #1
  Filled 2021-06-07 – 2021-06-17 (×2): qty 15, 25d supply, fill #2
  Filled 2021-09-13: qty 15, 25d supply, fill #3
  Filled 2021-09-21: qty 15, 25d supply, fill #0
  Filled 2021-10-13 – 2021-10-17 (×3): qty 15, 25d supply, fill #1
  Filled 2021-12-01 (×2): qty 15, 25d supply, fill #2
  Filled 2022-01-27 – 2022-01-28 (×2): qty 15, 25d supply, fill #3
  Filled 2022-02-22: qty 15, 25d supply, fill #4

## 2021-03-24 MED ORDER — PEN NEEDLES 31G X 6 MM MISC
1.0000 "pen " | Freq: Three times a day (TID) | 11 refills | Status: DC
  Filled 2021-03-24 – 2022-02-22 (×2): qty 100, 30d supply, fill #0

## 2021-03-24 MED ORDER — SILDENAFIL CITRATE 100 MG PO TABS
100.0000 mg | ORAL_TABLET | Freq: Every day | ORAL | 0 refills | Status: DC | PRN
  Filled 2021-03-24: qty 10, 30d supply, fill #0

## 2021-03-24 NOTE — Progress Notes (Signed)
[facility] [place], [place]  [address] Phone:  [phone]   Fax:  [phone]   Established Patient Office Visit  Subjective:  Patient ID: Dakota Kim, male    DOB: [birth year]  Age: 50 y.o. MRN: [id number]  CC:  Chief Complaint  Patient presents with   Follow-up    No questions or concerns.     HPI Dakota Kim presents for follow up. He  has a past medical history of Diabetes mellitus without complication ([place]) and Vitamin D deficiency (09/2019).   Diabetes Mellitus Patient presents for follow up of diabetes. Current symptoms include: hyperglycemia and hypoglycemia 46 . Symptoms have gradually worsened. Patient denies foot ulcerations, increased appetite, nausea, paresthesia of the feet, polydipsia, polyuria, visual disturbances, and vomiting. Evaluation to date has included: fasting blood sugar, fasting lipid panel, and hemoglobin A1C.  Home sugars: BGs are running  consistent with Hgb A1C. Current treatment: Continued insulin which has been not very effective and Continued sulfonylurea which has been not very effective.   Past Medical History:  Diagnosis Date   Diabetes mellitus without complication ([place])    Vitamin D deficiency 09/2019    History reviewed. No pertinent surgical history.  Family History  Problem Relation Age of Onset   Diabetes Mellitus II Father     Social History   Socioeconomic History   Marital status: Single    Spouse name: Not on file   Number of children: Not on file   Years of education: Not on file   Highest education level: Not on file  Occupational History   Not on file  Tobacco Use   Smoking status: Every Day    Packs/day: 0.15    Types: Cigarettes    Last attempt to quit: 10/08/2012    Years since quitting: 8.4   Smokeless tobacco: Never  Vaping Use   Vaping Use: Never used  Substance and Sexual Activity   Alcohol use: Yes    Comment: occassionally   Drug use: No   Sexual activity:  Not Currently  Other Topics Concern   Not on file  Social History Narrative   Not on file   Social Determinants of Health   Financial Resource Strain: Not on file  Food Insecurity: Not on file  Transportation Needs: Not on file  Physical Activity: Not on file  Stress: Not on file  Social Connections: Not on file  Intimate Partner Violence: Not on file    Outpatient Medications Prior to Visit  Medication Sig Dispense Refill   atorvastatin (LIPITOR) 10 MG tablet TAKE 1 TABLET (10 MG TOTAL) BY MOUTH DAILY. 30 tablet 11   blood glucose meter kit and supplies KIT Dispense based on patient and insurance preference. Check blood sugar up to four times daily or as needed. ICD 10 E11.9 1 each 0   busPIRone (BUSPAR) 5 MG tablet TAKE 1 TABLET (5 MG TOTAL) BY MOUTH 2 (TWO) TIMES DAILY. 60 tablet 11   glimepiride (AMARYL) 4 MG tablet TAKE 1 TABLET (4 MG TOTAL) BY MOUTH DAILY WITH BREAKFAST. 30 tablet 11   glipiZIDE (GLUCOTROL) 10 MG tablet TAKE 1 TABLET (10 MG TOTAL) BY MOUTH 2 (TWO) TIMES DAILY BEFORE A MEAL. 60 tablet 11   INS SYRINGE/NEEDLE 1CC/28G 28G X 1/2" 1 ML MISC 1 applicator by Does not apply route See admin instructions. 100 each 11   insulin glargine (LANTUS) 100 UNIT/ML injection INJECT 0.2 MLS (20 UNITS TOTAL) INTO THE SKIN DAILY. 10  mL 11   insulin lispro (HUMALOG) 100 UNIT/ML injection INJECT 0.3 MLS (30 UNITS TOTAL) INTO THE SKIN 2 (TWO) TIMES DAILY WITH BREAKFAST AND LUNCH. 10 mL 11   Insulin Syringe-Needle U-100 31G X 5/16" 1 ML MISC USE AS DIRECTED 100 each 11   Vitamin D, Ergocalciferol, (DRISDOL) 1.25 MG (50000 UNIT) CAPS capsule TAKE 1 CAPSULE (50,000 UNITS TOTAL) BY MOUTH EVERY 7 (SEVEN) DAYS. 5 capsule 6   atorvastatin (LIPITOR) 10 MG tablet Take 1 tablet (10 mg total) by mouth daily. 30 tablet 11   busPIRone (BUSPAR) 5 MG tablet Take 1 tablet (5 mg total) by mouth 2 (two) times daily. 60 tablet 11   glimepiride (AMARYL) 4 MG tablet Take 1 tablet (4 mg total) by mouth daily  with breakfast. 30 tablet 11   glipiZIDE (GLUCOTROL) 10 MG tablet Take 1 tablet (10 mg total) by mouth 2 (two) times daily before a meal. 60 tablet 11   insulin glargine (LANTUS) 100 UNIT/ML injection Inject 0.2 mLs (20 Units total) into the skin daily. 10 mL 11   insulin glargine (LANTUS) 100 UNIT/ML injection INJECT 0.2 MLS (20 UNITS TOTAL) INTO THE SKIN DAILY. 10 mL 1   insulin lispro (HUMALOG) 100 UNIT/ML injection Inject 0.3 mLs (30 Units total) into the skin 2 (two) times daily with breakfast and lunch. 10 mL 11   insulin lispro (HUMALOG) 100 UNIT/ML injection INJECT 0.45 MLS (45 UNITS TOTAL) INTO THE SKIN 2 (TWO) TIMES DAILY WITH BREAKFAST AND LUNCH. 10 mL 1   Vitamin D, Ergocalciferol, (DRISDOL) 1.25 MG (50000 UNIT) CAPS capsule Take 1 capsule (50,000 Units total) by mouth every 7 (seven) days. 5 capsule 6   No facility-administered medications prior to visit.    No Known Allergies  ROS Review of Systems    Objective:    Physical Exam Constitutional:      General: He is not in acute distress.    Appearance: He is normal weight. He is not ill-appearing, toxic-appearing or diaphoretic.  HENT:     Head: Normocephalic and atraumatic.  Cardiovascular:     Rate and Rhythm: Normal rate and regular rhythm.     Pulses:          Dorsalis pedis pulses are 1+ on the right side and 1+ on the left side.       Posterior tibial pulses are 1+ on the right side and 1+ on the left side.     Heart sounds: Normal heart sounds.  Pulmonary:     Effort: Pulmonary effort is normal.     Breath sounds: Normal breath sounds.  Musculoskeletal:     Cervical back: Normal range of motion.  Feet:     Right foot:     Protective Sensation: 10 sites tested.  10 sites sensed.     Skin integrity: Skin integrity normal.     Left foot:     Protective Sensation: 10 sites tested.  10 sites sensed.     Skin integrity: Skin integrity normal.  Neurological:     Mental Status: He is alert.   BP 131/77    Pulse 73   Temp 98.4 F (36.9 C)   Ht '5\' 6"'  (1.676 m)   Wt 182 lb 0.6 oz (82.6 kg)   SpO2 95%   BMI 29.38 kg/m  Wt Readings from Last 3 Encounters:  03/24/21 182 lb 0.6 oz (82.6 kg)  03/22/21 175 lb (79.4 kg)  11/17/20 184 lb (83.5 kg)     Health  Maintenance Due  Topic Date Due   PNEUMOCOCCAL POLYSACCHARIDE VACCINE AGE 32-64 HIGH RISK  Never done   Pneumococcal Vaccine 33-58 Years old (1 - PCV) Never done    There are no preventive care reminders to display for this patient.  Lab Results  Component Value Date   TSH 1.310 11/17/2020   Lab Results  Component Value Date   WBC 10.3 03/22/2021   HGB 15.0 03/22/2021   HCT 46.0 03/22/2021   MCV 91.1 03/22/2021   PLT 248 03/22/2021   Lab Results  Component Value Date   NA 135 03/22/2021   K 4.4 03/22/2021   CO2 23 03/22/2021   GLUCOSE 473 (H) 03/22/2021   BUN 13 03/22/2021   CREATININE 1.12 03/22/2021   BILITOT 0.6 07/28/2020   ALKPHOS 85 07/28/2020   AST 21 07/28/2020   ALT 22 07/28/2020   PROT 7.3 07/28/2020   ALBUMIN 3.9 07/28/2020   CALCIUM 9.2 03/22/2021   ANIONGAP 11 03/22/2021   Lab Results  Component Value Date   CHOL 132 11/17/2020   Lab Results  Component Value Date   HDL 50 11/17/2020   Lab Results  Component Value Date   LDLCALC 71 11/17/2020   Lab Results  Component Value Date   TRIG 48 11/17/2020   Lab Results  Component Value Date   CHOLHDL 2.6 11/17/2020   Lab Results  Component Value Date   HGBA1C 9.6 (A) 03/24/2021      Assessment & Plan:   Problem List Items Addressed This Visit       Endocrine   Type 2 diabetes mellitus without complication, with long-term current use of insulin ([facility]) - Primary Encourage compliance with current treatment regimen  Dose adjustment vial to pen yo increase compliance Encourage regular CBG monitoring Encourage contacting office if excessive hyperglycemia and or hypoglycemia Lifestyle modification with healthy diet (fewer calories, more high  fiber foods, whole grains and non-starchy vegetables, lower fat meat and fish, low-fat diary include healthy oils) regular exercise (physical activity) and weight loss Opthalmology exam discussed  Nutritional consult recommended Regular dental visits encouraged Home BP monitoring also encouraged goal <130/80 Discussed smoking cessation   Relevant Orders   Urinalysis Dipstick (Completed)   HgB A1c (Completed)   Glucose (CBG) (Completed)   Comp. Metabolic Panel (12)   Lipid panel   Microalbumin, urine   Other Visit Diagnoses     Screening for malignant neoplasm of prostate       Relevant Orders   PSA   Screening for cholesterol level       Screening for HIV (human immunodeficiency virus)       Relevant Orders   HIV Antibody (routine testing w rflx)   Encounter for hepatitis C screening test for low risk patient       Relevant Orders   Hepatitis C antibody       Meds ordered this encounter  Medications   sildenafil (VIAGRA) 100 MG tablet    Sig: Take 1 tablet (100 mg total) by mouth daily as needed for erectile dysfunction.    Dispense:  10 tablet    Refill:  0    Order Specific Question:   Supervising Provider    Answer:   Tresa Garter [id number]     Follow-up: Return in about 3 months (around 06/24/2021).    Vevelyn Francois, NP

## 2021-03-24 NOTE — Patient Instructions (Addendum)
Diabetes Mellitus and Nutrition, Adult When you have diabetes, or diabetes mellitus, it is very important to have healthy eating habits because your blood sugar (glucose) levels are greatly affected by what you eat and drink. Eating healthy foods in the right amounts, at about the same times every day, can help you: Control your blood glucose. Lower your risk of heart disease. Improve your blood pressure. Reach or maintain a healthy weight. What can affect my meal plan? Every person with diabetes is different, and each person has different needs for a meal plan. Your health care provider may recommend that you work with a dietitian to make a meal plan that is best for you. Your meal plan may vary depending on factors such as: The calories you need. The medicines you take. Your weight. Your blood glucose, blood pressure, and cholesterol levels. Your activity level. Other health conditions you have, such as heart or kidney disease. How do carbohydrates affect me? Carbohydrates, also called carbs, affect your blood glucose level more than any other type of food. Eating carbs naturally raises the amount of glucose in your blood. Carb counting is a method for keeping track of how many carbs you eat. Counting carbs is important to keep your blood glucose at a healthy level, especially if you use insulin or take certain oral diabetes medicines. It is important to know how many carbs you can safely have in each meal. This is different for every person. Your dietitian can help you calculate how many carbs you should have at each meal and for each snack. How does alcohol affect me? Alcohol can cause a sudden decrease in blood glucose (hypoglycemia), especially if you use insulin or take certain oral diabetes medicines. Hypoglycemia can be a life-threatening condition. Symptoms of hypoglycemia, such as sleepiness, dizziness, and confusion, are similar to symptoms of having too much alcohol. Do not drink  alcohol if: Your health care provider tells you not to drink. You are pregnant, may be pregnant, or are planning to become pregnant. If you drink alcohol: Do not drink on an empty stomach. Limit how much you use to: 0-1 drink a day for women. 0-2 drinks a day for men. Be aware of how much alcohol is in your drink. In the [place] one drink equals one 12 oz bottle of beer (355 mL), one 5 oz glass of wine (148 mL), or one 1 oz glass of hard liquor (44 mL). Keep yourself hydrated with water, diet soda, or unsweetened iced tea. Keep in mind that regular soda, juice, and other mixers may contain a lot of sugar and must be counted as carbs. What are tips for following this plan? Reading food labels Start by checking the serving size on the "Nutrition Facts" label of packaged foods and drinks. The amount of calories, carbs, fats, and other nutrients listed on the label is based on one serving of the item. Many items contain more than one serving per package. Check the total grams (g) of carbs in one serving. You can calculate the number of servings of carbs in one serving by dividing the total carbs by 15. For example, if a food has 30 g of total carbs per serving, it would be equal to 2 servings of carbs. Check the number of grams (g) of saturated fats and trans fats in one serving. Choose foods that have a low amount or none of these fats. Check the number of milligrams (mg) of salt (sodium) in one serving. Most people should limit   total sodium intake to less than 2,300 mg per day. Always check the nutrition information of foods labeled as "low-fat" or "nonfat." These foods may be higher in added sugar or refined carbs and should be avoided. Talk to your dietitian to identify your daily goals for nutrients listed on the label. Shopping Avoid buying canned, pre-made, or processed foods. These foods tend to be high in fat, sodium, and added sugar. Shop around the outside edge of the grocery store. This  is where you will most often find fresh fruits and vegetables, bulk grains, fresh meats, and fresh dairy. Cooking Use low-heat cooking methods, such as baking, instead of high-heat cooking methods like deep frying. Cook using healthy oils, such as olive, canola, or sunflower oil. Avoid cooking with butter, cream, or high-fat meats. Meal planning Eat meals and snacks regularly, preferably at the same times every day. Avoid going long periods of time without eating. Eat foods that are high in fiber, such as fresh fruits, vegetables, beans, and whole grains. Talk with your dietitian about how many servings of carbs you can eat at each meal. Eat 4-6 oz (112-168 g) of lean protein each day, such as lean meat, chicken, fish, eggs, or tofu. One ounce (oz) of lean protein is equal to: 1 oz (28 g) of meat, chicken, or fish. 1 egg.  cup (62 g) of tofu. Eat some foods each day that contain healthy fats, such as avocado, nuts, seeds, and fish. What foods should I eat? Fruits Berries. Apples. Oranges. Peaches. Apricots. Plums. Grapes. Mango. Papaya. Pomegranate. Kiwi. Cherries. Vegetables Lettuce. Spinach. Leafy greens, including kale, chard, collard greens, and mustard greens. Beets. Cauliflower. Cabbage. Broccoli. Carrots. Green beans. Tomatoes. Peppers. Onions. Cucumbers. Brussels sprouts. Grains Whole grains, such as whole-wheat or whole-grain bread, crackers, tortillas, cereal, and pasta. Unsweetened oatmeal. Quinoa. Brown or wild rice. Meats and other proteins Seafood. Poultry without skin. Lean cuts of poultry and beef. Tofu. Nuts. Seeds. Dairy Low-fat or fat-free dairy products such as milk, yogurt, and cheese. The items listed above may not be a complete list of foods and beverages you can eat. Contact a dietitian for more information. What foods should I avoid? Fruits Fruits canned with syrup. Vegetables Canned vegetables. Frozen vegetables with butter or cream sauce. Grains Refined  white flour and flour products such as bread, pasta, snack foods, and cereals. Avoid all processed foods. Meats and other proteins Fatty cuts of meat. Poultry with skin. Breaded or fried meats. Processed meat. Avoid saturated fats. Dairy Full-fat yogurt, cheese, or milk. Beverages Sweetened drinks, such as soda or iced tea. The items listed above may not be a complete list of foods and beverages you should avoid. Contact a dietitian for more information. Questions to ask a health care provider Do I need to meet with a diabetes educator? Do I need to meet with a dietitian? What number can I call if I have questions? When are the best times to check my blood glucose? Where to find more information: American Diabetes Association: diabetes.org Academy of Nutrition and Dietetics: www.eatright.org National Institute of Diabetes and Digestive and Kidney Diseases: www.niddk.nih.gov Association of Diabetes Care and Education Specialists: www.diabeteseducator.org Summary It is important to have healthy eating habits because your blood sugar (glucose) levels are greatly affected by what you eat and drink. A healthy meal plan will help you control your blood glucose and maintain a healthy lifestyle. Your health care provider may recommend that you work with a dietitian to make a meal plan that   is best for you. Keep in mind that carbohydrates (carbs) and alcohol have immediate effects on your blood glucose levels. It is important to count carbs and to use alcohol carefully. This information is not intended to replace advice given to you by your health care provider. Make sure you discuss any questions you have with your health care provider. Document Revised: 08/05/2019 Document Reviewed: 08/05/2019 Elsevier Patient Education  2021 Elsevier Inc. Steps to Quit Smoking Smoking tobacco is the leading cause of preventable death. It can affect almost every organ in the body. Smoking puts you and people  around you at risk for many serious, long-lasting (chronic) diseases. Quitting smoking can be hard, but it is one of the best things that you can do for your health. It is never too late to quit. How do I get ready to quit? When you decide to quit smoking, make a plan to help you succeed. Before you quit: Pick a date to quit. Set a date within the next 2 weeks to give you time to prepare. Write down the reasons why you are quitting. Keep this list in places where you will see it often. Tell your family, friends, and co-workers that you are quitting. Their support is important. Talk with your doctor about the choices that may help you quit. Find out if your health insurance will pay for these treatments. Know the people, places, things, and activities that make you want to smoke (triggers). Avoid them. What first steps can I take to quit smoking? Throw away all cigarettes at home, at work, and in your car. Throw away the things that you use when you smoke, such as ashtrays and lighters. Clean your car. Make sure to empty the ashtray. Clean your home, including curtains and carpets. What can I do to help me quit smoking? Talk with your doctor about taking medicines and seeing a counselor at the same time. You are more likely to succeed when you do both. If you are pregnant or breastfeeding, talk with your doctor about counseling or other ways to quit smoking. Do not take medicine to help you quit smoking unless your doctor tells you to do so. To quit smoking: Quit right away Quit smoking totally, instead of slowly cutting back on how much you smoke over a period of time. Go to counseling. You are more likely to quit if you go to counseling sessions regularly. Take medicine You may take medicines to help you quit. Some medicines need a prescription, and some you can buy over-the-counter. Some medicines may contain a drug called nicotine to replace the nicotine in cigarettes. Medicines may: Help  you to stop having the desire to smoke (cravings). Help to stop the problems that come when you stop smoking (withdrawal symptoms). Your doctor may ask you to use: Nicotine patches, gum, or lozenges. Nicotine inhalers or sprays. Non-nicotine medicine that is taken by mouth. Find resources Find resources and other ways to help you quit smoking and remain smoke-free after you quit. These resources are most helpful when you use them often. They include: Online chats with a counselor. Phone quitlines. Printed self-help materials. Support groups or group counseling. Text messaging programs. Mobile phone apps. Use apps on your mobile phone or tablet that can help you stick to your quit plan. There are many free apps for mobile phones and tablets as well as websites. Examples include Quit Guide from the CDC and smokefree.gov  What things can I do to make it easier to quit?    Talk to your family and friends. Ask them to support and encourage you. Call a phone quitline (1-800-QUIT-NOW), reach out to support groups, or work with a counselor. Ask people who smoke to not smoke around you. Avoid places that make you want to smoke, such as: Bars. Parties. Smoke-break areas at work. Spend time with people who do not smoke. Lower the stress in your life. Stress can make you want to smoke. Try these things to help your stress: Getting regular exercise. Doing deep-breathing exercises. Doing yoga. Meditating. Doing a body scan. To do this, close your eyes, focus on one area of your body at a time from head to toe. Notice which parts of your body are tense. Try to relax the muscles in those areas. How will I feel when I quit smoking? Day 1 to 3 weeks Within the first 24 hours, you may start to have some problems that come from quitting tobacco. These problems are very bad 2-3 days after you quit, but they do not often last for more than 2-3 weeks. You may get these symptoms: Mood swings. Feeling  restless, nervous, angry, or annoyed. Trouble concentrating. Dizziness. Strong desire for high-sugar foods and nicotine. Weight gain. Trouble pooping (constipation). Feeling like you may vomit (nausea). Coughing or a sore throat. Changes in how the medicines that you take for other issues work in your body. Depression. Trouble sleeping (insomnia). Week 3 and afterward After the first 2-3 weeks of quitting, you may start to notice more positive results, such as: Better sense of smell and taste. Less coughing and sore throat. Slower heart rate. Lower blood pressure. Clearer skin. Better breathing. Fewer sick days. Quitting smoking can be hard. Do not give up if you fail the first time. Some people need to try a few times before they succeed. Do your best to stick to your quit plan, and talk with your doctor if you have any questions or concerns. Summary Smoking tobacco is the leading cause of preventable death. Quitting smoking can be hard, but it is one of the best things that you can do for your health. When you decide to quit smoking, make a plan to help you succeed. Quit smoking right away, not slowly over a period of time. When you start quitting, seek help from your doctor, family, or friends. This information is not intended to replace advice given to you by your health care provider. Make sure you discuss any questions you have with your health care provider. Document Revised: 05/23/2019 Document Reviewed: 11/16/2018 Elsevier Patient Education  2022 Elsevier Inc.   

## 2021-03-25 ENCOUNTER — Telehealth: Payer: Self-pay | Admitting: Clinical

## 2021-03-25 LAB — COMP. METABOLIC PANEL (12)
AST: 13 [IU]/L (ref 0–40)
Albumin/Globulin Ratio: 1.6 (ref 1.2–2.2)
Albumin: 4.6 g/dL (ref 4.0–5.0)
Alkaline Phosphatase: 79 [IU]/L (ref 44–121)
BUN/Creatinine Ratio: 11 (ref 9–20)
BUN: 12 mg/dL (ref 6–24)
Bilirubin Total: 0.4 mg/dL (ref 0.0–1.2)
Calcium: 9.7 mg/dL (ref 8.7–10.2)
Chloride: 101 mmol/L (ref 96–106)
Creatinine, Ser: 1.1 mg/dL (ref 0.76–1.27)
Globulin, Total: 2.8 g/dL (ref 1.5–4.5)
Glucose: 347 mg/dL — ABNORMAL HIGH (ref 65–99)
Potassium: 4.5 mmol/L (ref 3.5–5.2)
Sodium: 140 mmol/L (ref 134–144)
Total Protein: 7.4 g/dL (ref 6.0–8.5)
eGFR: 82 mL/min/{1.73_m2}

## 2021-03-25 LAB — LIPID PANEL
Chol/HDL Ratio: 3.3 ratio (ref 0.0–5.0)
Cholesterol, Total: 169 mg/dL (ref 100–199)
HDL: 52 mg/dL
LDL Chol Calc (NIH): 99 mg/dL (ref 0–99)
Triglycerides: 96 mg/dL (ref 0–149)
VLDL Cholesterol Cal: 18 mg/dL (ref 5–40)

## 2021-03-25 LAB — PSA: Prostate Specific Ag, Serum: 1 ng/mL (ref 0.0–4.0)

## 2021-03-25 LAB — HEPATITIS C ANTIBODY: Hep C Virus Ab: <0.1 {s_co_ratio} (ref 0.0–0.9)

## 2021-03-25 LAB — HIV ANTIBODY (ROUTINE TESTING W REFLEX): HIV Screen 4th Generation wRfx: NONREACTIVE

## 2021-03-25 NOTE — Telephone Encounter (Signed)
Integrated Behavioral Health Case Management Referral Note  03/25/2021 Name: Dakota Kim MRN: [id number] DOB: [birth year] Dakota Kim is a 50 y.o. year old male who sees Barbette Merino, NP for primary care. LCSW was consulted to assess patient's needs and assist the patient with  financial difficulties related to lack of health coverage .  Interpreter: No.   Interpreter Name & Language: none  Assessment: Patient experiencing  financial difficulties related to lack of health coverage .   Intervention: CSW called patient today to follow up on referral from PCP. Patient confirmed he does not have health insurance. He works but has low income. CSW referred patient to [employer] [address] [facility]) for health insurance marketplace navigator support. Patient consented to the referral.   Provided patient with [employer] and [employer] applications via mail. Reviewed application on the phone today and advised patient on supporting documents to be submitted with the application. Advised patient to follow up with CSW for assistance in scheduling appointment with financial counselor at [facility]) to submit the application if needed. Provided [facility] and CSW contact information.   Patient reported he has difficulty affording food and is not eligible for food stamps. CSW included food resources in [employer].   Patient also reported he is unable to go pick up his medications at [facility] pharmacy due to transportation barriers. CSW enrolled patient in [facility] Transportation services and advised patient to call Transportation when he is ready to go pick up his meds.  Review of patient status, including review of consultants reports, relevant laboratory and other test results, and collaboration with appropriate care team members and the patient's provider was performed as part of comprehensive patient evaluation and provision of services.    Abigail Butts, LCSW [facility] [phone]

## 2021-03-28 ENCOUNTER — Other Ambulatory Visit: Payer: Self-pay

## 2021-04-25 ENCOUNTER — Other Ambulatory Visit: Payer: Self-pay

## 2021-05-02 ENCOUNTER — Other Ambulatory Visit: Payer: Self-pay

## 2021-06-07 ENCOUNTER — Other Ambulatory Visit: Payer: Self-pay

## 2021-06-14 ENCOUNTER — Other Ambulatory Visit: Payer: Self-pay

## 2021-06-17 ENCOUNTER — Other Ambulatory Visit: Payer: Self-pay

## 2021-06-24 ENCOUNTER — Ambulatory Visit: Payer: Self-pay | Admitting: Nurse Practitioner

## 2021-09-13 ENCOUNTER — Other Ambulatory Visit: Payer: Self-pay | Admitting: Nurse Practitioner

## 2021-09-13 ENCOUNTER — Other Ambulatory Visit: Payer: Self-pay

## 2021-09-13 MED FILL — Atorvastatin Calcium Tab 10 MG (Base Equivalent): ORAL | 30 days supply | Qty: 30 | Fill #0 | Status: CN

## 2021-09-13 MED FILL — Buspirone HCl Tab 5 MG: ORAL | 30 days supply | Qty: 60 | Fill #0 | Status: CN

## 2021-09-13 MED FILL — Glimepiride Tab 4 MG: ORAL | 30 days supply | Qty: 30 | Fill #0 | Status: CN

## 2021-09-13 MED FILL — Ergocalciferol Cap 1.25 MG (50000 Unit): ORAL | 35 days supply | Qty: 5 | Fill #0 | Status: CN

## 2021-09-13 MED FILL — Glipizide Tab 10 MG: ORAL | 30 days supply | Qty: 60 | Fill #0 | Status: CN

## 2021-09-14 ENCOUNTER — Other Ambulatory Visit: Payer: Self-pay

## 2021-09-21 ENCOUNTER — Encounter (HOSPITAL_COMMUNITY): Payer: Self-pay | Admitting: Emergency Medicine

## 2021-09-21 ENCOUNTER — Other Ambulatory Visit: Payer: Self-pay

## 2021-09-21 ENCOUNTER — Emergency Department (HOSPITAL_COMMUNITY)
Admission: EM | Admit: 2021-09-21 | Discharge: 2021-09-21 | Disposition: A | Discharge status: Home / Self Care | Payer: Self-pay | Attending: Emergency Medicine | Admitting: Emergency Medicine

## 2021-09-21 DIAGNOSIS — Z7984 Long term (current) use of oral hypoglycemic drugs: Secondary | ICD-10-CM | POA: Insufficient documentation

## 2021-09-21 DIAGNOSIS — M79642 Pain in left hand: Secondary | ICD-10-CM | POA: Insufficient documentation

## 2021-09-21 DIAGNOSIS — R252 Cramp and spasm: Secondary | ICD-10-CM

## 2021-09-21 DIAGNOSIS — Z794 Long term (current) use of insulin: Secondary | ICD-10-CM | POA: Insufficient documentation

## 2021-09-21 DIAGNOSIS — R739 Hyperglycemia, unspecified: Secondary | ICD-10-CM | POA: Insufficient documentation

## 2021-09-21 LAB — URINALYSIS, ROUTINE W REFLEX MICROSCOPIC
Bacteria, UA: NONE SEEN
Bilirubin Urine: NEGATIVE
Glucose, UA: >=500 mg/dL — AB
Hgb urine dipstick: NEGATIVE
Ketones, ur: 5 mg/dL — AB
Leukocytes,Ua: NEGATIVE
Nitrite: NEGATIVE
Protein, ur: NEGATIVE mg/dL
Specific Gravity, Urine: 1.031 — ABNORMAL HIGH (ref 1.005–1.030)
pH: 5 (ref 5.0–8.0)

## 2021-09-21 LAB — CBC WITH DIFFERENTIAL/PLATELET
Abs Immature Granulocytes: 0.04 10*3/uL (ref 0.00–0.07)
Basophils Absolute: 0 10*3/uL (ref 0.0–0.1)
Basophils Relative: 0 %
Eosinophils Absolute: 0 10*3/uL (ref 0.0–0.5)
Eosinophils Relative: 0 %
HCT: 48.6 % (ref 39.0–52.0)
Hemoglobin: 16.4 g/dL (ref 13.0–17.0)
Immature Granulocytes: 0 %
Lymphocytes Relative: 13 %
Lymphs Abs: 1.3 10*3/uL (ref 0.7–4.0)
MCH: 29.7 pg (ref 26.0–34.0)
MCHC: 33.7 g/dL (ref 30.0–36.0)
MCV: 88 fL (ref 80.0–100.0)
Monocytes Absolute: 0.7 10*3/uL (ref 0.1–1.0)
Monocytes Relative: 7 %
Neutro Abs: 7.8 10*3/uL — ABNORMAL HIGH (ref 1.7–7.7)
Neutrophils Relative %: 80 %
Platelets: 275 10*3/uL (ref 150–400)
RBC: 5.52 MIL/uL (ref 4.22–5.81)
RDW: 12.9 % (ref 11.5–15.5)
WBC: 9.8 10*3/uL (ref 4.0–10.5)
nRBC: 0 % (ref 0.0–0.2)

## 2021-09-21 LAB — COMPREHENSIVE METABOLIC PANEL
ALT: 19 U/L (ref 0–44)
AST: 17 U/L (ref 15–41)
Albumin: 4.6 g/dL (ref 3.5–5.0)
Alkaline Phosphatase: 85 U/L (ref 38–126)
Anion gap: 10 (ref 5–15)
BUN: 19 mg/dL (ref 6–20)
CO2: 22 mmol/L (ref 22–32)
Calcium: 10.3 mg/dL (ref 8.9–10.3)
Chloride: 102 mmol/L (ref 98–111)
Creatinine, Ser: 0.93 mg/dL (ref 0.61–1.24)
GFR, Estimated: >60 mL/min (ref >60)
Glucose, Bld: 378 mg/dL — ABNORMAL HIGH (ref 70–99)
Potassium: 3.9 mmol/L (ref 3.5–5.1)
Sodium: 134 mmol/L — ABNORMAL LOW (ref 135–145)
Total Bilirubin: 0.8 mg/dL (ref 0.3–1.2)
Total Protein: 8.3 g/dL — ABNORMAL HIGH (ref 6.5–8.1)

## 2021-09-21 LAB — CBG MONITORING, ED
Glucose-Capillary: 449 mg/dL — ABNORMAL HIGH (ref 70–99)
Glucose-Capillary: 152 mg/dL — ABNORMAL HIGH (ref 70–99)

## 2021-09-21 LAB — I-STAT CHEM 8, ED
BUN: 18 mg/dL (ref 6–20)
Calcium, Ion: 1.36 mmol/L (ref 1.15–1.40)
Chloride: 101 mmol/L (ref 98–111)
Creatinine, Ser: 0.7 mg/dL (ref 0.61–1.24)
Glucose, Bld: 362 mg/dL — ABNORMAL HIGH (ref 70–99)
HCT: 51 % (ref 39.0–52.0)
Hemoglobin: 17.3 g/dL — ABNORMAL HIGH (ref 13.0–17.0)
Potassium: 4 mmol/L (ref 3.5–5.1)
Sodium: 136 mmol/L (ref 135–145)
TCO2: 22 mmol/L (ref 22–32)

## 2021-09-21 LAB — BLOOD GAS, VENOUS
Acid-base deficit: 2.1 mmol/L — ABNORMAL HIGH (ref 0.0–2.0)
Bicarbonate: 22.1 mmol/L (ref 20.0–28.0)
O2 Saturation: 89.9 %
Patient temperature: 98.6
pCO2, Ven: 38.1 mmHg — ABNORMAL LOW (ref 44.0–60.0)
pH, Ven: 7.381 (ref 7.250–7.430)
pO2, Ven: 57.3 mmHg — ABNORMAL HIGH (ref 32.0–45.0)

## 2021-09-21 LAB — BETA-HYDROXYBUTYRIC ACID: Beta-Hydroxybutyric Acid: 0.32 mmol/L — ABNORMAL HIGH (ref 0.05–0.27)

## 2021-09-21 MED ORDER — SODIUM CHLORIDE 0.9 % IV BOLUS
1000.0000 mL | Freq: Once | INTRAVENOUS | Status: AC
  Administered 2021-09-21: 1000 mL via INTRAVENOUS

## 2021-09-21 NOTE — ED Triage Notes (Signed)
Per pt, states he restarted his insulin today-states he has been out for over a week-states once he gave himself his insulin he vomited once and then noticed his hands and fingers cramping

## 2021-09-21 NOTE — ED Notes (Signed)
CBG: [phone] 

## 2021-09-21 NOTE — ED Provider Triage Note (Signed)
Emergency Medicine Provider Triage Evaluation Note  Dakota Kim , a 51 y.o. male  was evaluated in triage.  Pt complains of one week of being out of insulin - states he took his insulin today and vomited once and began having hand cramps in R hand.   30 short acting insulin BID and amaryl and glipizide daily along with long acting once daily  ^ daily antihyperglycemic regimen   Review of Systems  Positive: Hand cramping, polyuria/polydipsia  Negative: CP  Physical Exam  BP (!) 150/101 (BP Location: Left Arm)    Pulse (!) 124    Temp 97.9 F (36.6 C) (Oral)    Resp 18    Ht 5\' 6"  (1.676 m)    Wt 79.4 kg    SpO2 97%    BMI 28.25 kg/m  Gen:   Awake, no distress   Resp:  Normal effort  MSK:   Moves extremities without difficulty  Other:  Abd soft NTTP  Medical Decision Making  Medically screening exam initiated at 5:44 PM.  Appropriate orders placed.  Dakota Kim was informed that the remainder of the evaluation will be completed by another provider, this initial triage assessment does not replace that evaluation, and the importance of remaining in the ED until their evaluation is complete.  Hyperglycemic seemingly 2/2 medication noncompliance. Will obtain labs and order hydration.    [place], DOLE 09/21/21 [phone]

## 2021-09-21 NOTE — Discharge Instructions (Addendum)
Take your medications as prescribed.  Please check your blood sugar 3 times a day and document this for your primary care provider's review when you follow-up in the office.

## 2021-09-21 NOTE — ED Provider Notes (Signed)
[facility] DEPT Provider Note   CSN: [id number] Arrival date & time: 09/21/21  1730     History  Chief Complaint  Patient presents with   Hyperglycemia    Dakota Kim is a 51 y.o. male.   Hyperglycemia  Patient is a 51 year old male presented emergency room today with complaints of left hand cramping.  He states it is achy constant for the past day or 2.  He states that he feels that his blood sugar is high.  He has not been taking his insulin or oral antihyperglycemic's for the past week because of issues buying his medications.  He states that he picked up his insulin today because it is free and he felt that some of his symptoms may be related to hyperglycemia.  He came to the ER for evaluation of his hand cramps.       Home Medications Prior to Admission medications   Medication Sig Start Date End Date Taking? Authorizing Provider  atorvastatin (LIPITOR) 10 MG tablet TAKE 1 TABLET (10 MG TOTAL) BY MOUTH DAILY. 11/17/20 11/17/21  Azzie Glatter, FNP  blood glucose meter kit and supplies KIT Dispense based on patient and insurance preference. Check blood sugar up to four times daily or as needed. ICD 10 E11.9 11/18/20   Azzie Glatter, FNP  busPIRone (BUSPAR) 5 MG tablet TAKE 1 TABLET (5 MG TOTAL) BY MOUTH 2 (TWO) TIMES DAILY. 11/17/20 11/17/21  Azzie Glatter, FNP  glimepiride (AMARYL) 4 MG tablet TAKE 1 TABLET (4 MG TOTAL) BY MOUTH DAILY WITH BREAKFAST. 11/17/20 11/17/21  Azzie Glatter, FNP  glipiZIDE (GLUCOTROL) 10 MG tablet TAKE 1 TABLET (10 MG TOTAL) BY MOUTH 2 (TWO) TIMES DAILY BEFORE A MEAL. 11/17/20 11/17/21  Azzie Glatter, FNP  INS SYRINGE/NEEDLE 1CC/28G 28G X 1/2" 1 ML MISC 1 applicator by Does not apply route See admin instructions. 11/17/20   Azzie Glatter, FNP  insulin glargine (LANTUS) 100 UNIT/ML Solostar Pen Inject 20 Units into the skin daily. 03/24/21   Vevelyn Francois, NP  insulin lispro (HUMALOG KWIKPEN) 100 UNIT/ML  KwikPen Inject 30 Units into the skin in the morning and at bedtime. 03/24/21   Vevelyn Francois, NP  Insulin Pen Needle (PEN NEEDLES) 31G X 6 MM MISC use as directed 3 (three) times daily. 03/24/21   Vevelyn Francois, NP  Insulin Syringe-Needle U-100 31G X 5/16" 1 ML MISC USE AS DIRECTED 11/17/20 11/17/21  Azzie Glatter, FNP  sildenafil (VIAGRA) 100 MG tablet Take 1 tablet (100 mg total) by mouth daily as needed for erectile dysfunction. 03/24/21   Vevelyn Francois, NP  Vitamin D, Ergocalciferol, (DRISDOL) 1.25 MG (50000 UNIT) CAPS capsule TAKE 1 CAPSULE (50,000 UNITS TOTAL) BY MOUTH EVERY 7 (SEVEN) DAYS. 11/25/20 11/25/21  Azzie Glatter, FNP  ferrous sulfate 325 (65 FE) MG tablet Take 1 tablet (325 mg total) by mouth daily. Patient not taking: Reported on 09/23/2019 08/28/18 07/28/20  Azzie Glatter, FNP  Insulin Lispro Prot & Lispro (HUMALOG MIX 75/25 KWIKPEN) (75-25) 100 UNIT/ML Kwikpen Inject 45 Units into the skin 2 (two) times daily with breakfast and lunch. 10/13/20 10/13/20  Tedd Sias, PA      Allergies    Patient has no known allergies.    Review of Systems   Review of Systems  Neurological:        Hand cramps   Physical Exam Updated Vital Signs BP 134/89    Pulse 95  Temp 97.9 F (36.6 C) (Oral)    Resp 18    Ht _0  (1.676 m)    Wt 79.4 kg    SpO2 97%    BMI 28.25 kg/m  Physical Exam Vitals and nursing note reviewed.  Constitutional:      General: He is not in acute distress.    Appearance: Normal appearance. He is not ill-appearing.  HENT:     Head: Normocephalic and atraumatic.     Nose: Nose normal.     Mouth/Throat:     Mouth: Mucous membranes are dry.  Eyes:     General: No scleral icterus.       Right eye: No discharge.        Left eye: No discharge.     Conjunctiva/sclera: Conjunctivae normal.  Cardiovascular:     Rate and Rhythm: Regular rhythm. Tachycardia present.     Pulses: Normal pulses.     Heart sounds: Normal heart sounds.  Pulmonary:      Effort: Pulmonary effort is normal. No respiratory distress.     Breath sounds: No stridor. No wheezing.  Abdominal:     Palpations: Abdomen is soft.     Tenderness: There is no abdominal tenderness.  Musculoskeletal:     Cervical back: Normal range of motion.     Right lower leg: No edema.     Left lower leg: No edema.  Skin:    General: Skin is warm and dry.     Capillary Refill: Capillary refill takes less than 2 seconds.  Neurological:     Mental Status: He is alert and oriented to person, place, and time. Mental status is at baseline.     Comments: Sensation intact and good movements of fingers of both hands.  Cap refill intact.  Bilateral grip strength symmetric  Psychiatric:        Mood and Affect: Mood normal.        Behavior: Behavior normal.    ED Results / Procedures / Treatments   Labs (all labs ordered are listed, but only abnormal results are displayed) Labs Reviewed  BETA-HYDROXYBUTYRIC ACID - Abnormal; Notable for the following components:      Result Value   Beta-Hydroxybutyric Acid 0.32 (*)    All other components within normal limits  BLOOD GAS, VENOUS - Abnormal; Notable for the following components:   pCO2, Ven 38.1 (*)    pO2, Ven 57.3 (*)    Acid-base deficit 2.1 (*)    All other components within normal limits  CBC WITH DIFFERENTIAL/PLATELET - Abnormal; Notable for the following components:   Neutro Abs 7.8 (*)    All other components within normal limits  COMPREHENSIVE METABOLIC PANEL - Abnormal; Notable for the following components:   Sodium 134 (*)    Glucose, Bld 378 (*)    Total Protein 8.3 (*)    All other components within normal limits  URINALYSIS, ROUTINE W REFLEX MICROSCOPIC - Abnormal; Notable for the following components:   Color, Urine STRAW (*)    Specific Gravity, Urine 1.031 (*)    Glucose, UA >=500 (*)    Ketones, ur 5 (*)    All other components within normal limits  CBG MONITORING, ED - Abnormal; Notable for the following  components:   Glucose-Capillary 449 (*)    All other components within normal limits  I-STAT CHEM 8, ED - Abnormal; Notable for the following components:   Glucose, Bld 362 (*)    Hemoglobin 17.3 (*)  All other components within normal limits  CBG MONITORING, ED - Abnormal; Notable for the following components:   Glucose-Capillary 152 (*)    All other components within normal limits    EKG EKG Interpretation  Date/Time:  Wednesday September 21 2021 18:03:53 EST Ventricular Rate:  110 PR Interval:  130 QRS Duration: 90 QT Interval:  312 QTC Calculation: 422 R Axis:   49 Text Interpretation: Sinus tachycardia Multiple ventricular premature complexes Low voltage, precordial leads Borderline repolarization abnormality Baseline wander in lead(s) V5 V6 Confirmed by Thamas Jaegers ([id number]) on 09/21/2021 6:36:28 PM  Radiology No results found.  Procedures .Critical Care Performed by: Tedd Sias, PA Authorized by: Tedd Sias, PA   Critical care provider statement:    Critical care time (minutes):  35   Critical care time was exclusive of:  Separately billable procedures and treating other patients and teaching time   Critical care was necessary to treat or prevent imminent or life-threatening deterioration of the following conditions: Metabolic derangement from hyperglycemia.   Critical care was time spent personally by me on the following activities:  Development of treatment plan with patient or surrogate, review of old charts, re-evaluation of patient's condition, pulse oximetry, ordering and review of radiographic studies, ordering and review of laboratory studies, ordering and performing treatments and interventions, obtaining history from patient or surrogate, examination of patient and evaluation of patient's response to treatment   Care discussed with: admitting provider      Medications Ordered in ED Medications  sodium chloride 0.9 % bolus 1,000 mL (0 mLs Intravenous  Stopped 09/21/21 1940)    ED Course/ Medical Decision Making/ A&P Clinical Course as of 09/21/21 1953  Wed Sep 21, 2021  1952 CBG 449 --> 152 [WF]  1953 Symptoms improved [WF]    Clinical Course User Index [WF] Tedd Sias, [place]                           Medical Decision Making  This patient presents to the ED for concern of hand cramps found to be hyperglycemic, this involves a number of treatment options, and is a complaint that carries with it a high risk of complications and morbidity.  The differential diagnosis includes hyperglycemia/HHS, DKA and acidosis, severe electrolyte derangements severe dehydration   Co morbidities: Discussed in HPI   Additional history: Patient has not been taking his insulin or other antihyperglycemic's for 1 week  External records from outside source obtained and reviewed including past evaluations   Lab Tests:  I ordered, and personally interpreted labs.  The pertinent results include:    Labs with significant abnormalities  Initial CBG 450.  Will hydrate and obtain labs.  Beta hydroxybutyrate marginally elevated.  CMP with mild hyponatremia corrects to normal with hyperglycemia.  VBG without acidosis bicarb somewhat low.  CBC without leukocytosis or anemia.  5 ketones in urine.  High specific gravity and glucosuria consistent with dehydration.  CBG improved to 152  Imaging Studies:  I ordered imaging studies including no imaging studies  Cardiac Monitoring:  None  Medicines ordered:  I ordered medication including normal saline for hyperglycemia Reevaluation of the patient after these medicines showed that the patient improved I have reviewed the patients home medicines and have made adjustments as needed   Test Considered:  None   Critical Interventions:  Hydration.   Consults:  None    Reevaluation:  After the interventions noted above, I reevaluated the  patient and found that they have :improved   Social  Determinants of Health:  No barriers to discharge   Dispostion:  After consideration of the diagnostic results and the patients response to treatment, I feel that the patent would benefit from expedient follow-up.  He assures me that he will be able to follow-up with his PCP.   He has restarted his long and short acting insulin he will check his blood sugars 3 times daily he states he has a working glucometer.  Return precautions given.   Final Clinical Impression(s) / ED Diagnoses Final diagnoses:  Hyperglycemia  Hand cramp    Rx / DC Orders ED Discharge Orders     None         Tedd Sias, [place] 09/21/21 2022    Luna Fuse, MD 09/21/21 2111

## 2021-10-13 ENCOUNTER — Other Ambulatory Visit (HOSPITAL_COMMUNITY): Payer: Self-pay

## 2021-10-17 ENCOUNTER — Other Ambulatory Visit (HOSPITAL_COMMUNITY): Payer: Self-pay

## 2021-10-17 ENCOUNTER — Other Ambulatory Visit: Payer: Self-pay

## 2021-10-19 ENCOUNTER — Other Ambulatory Visit: Payer: Self-pay

## 2021-12-01 ENCOUNTER — Other Ambulatory Visit: Payer: Self-pay

## 2021-12-01 ENCOUNTER — Other Ambulatory Visit (HOSPITAL_COMMUNITY): Payer: Self-pay

## 2021-12-05 ENCOUNTER — Other Ambulatory Visit: Payer: Self-pay

## 2021-12-10 ENCOUNTER — Emergency Department (HOSPITAL_COMMUNITY)
Admission: EM | Admit: 2021-12-10 | Discharge: 2021-12-11 | Disposition: A | Discharge status: Other Acute Inpatient Hospital | Payer: No Typology Code available for payment source | Attending: Emergency Medicine | Admitting: Emergency Medicine

## 2021-12-10 ENCOUNTER — Encounter (HOSPITAL_COMMUNITY): Payer: Self-pay | Admitting: Emergency Medicine

## 2021-12-10 ENCOUNTER — Other Ambulatory Visit: Payer: Self-pay

## 2021-12-10 DIAGNOSIS — Y990 Civilian activity done for income or pay: Secondary | ICD-10-CM | POA: Diagnosis not present

## 2021-12-10 DIAGNOSIS — T23061A Burn of unspecified degree of back of right hand, initial encounter: Secondary | ICD-10-CM | POA: Diagnosis present

## 2021-12-10 DIAGNOSIS — Z5321 Procedure and treatment not carried out due to patient leaving prior to being seen by health care provider: Secondary | ICD-10-CM | POA: Diagnosis not present

## 2021-12-10 DIAGNOSIS — X18XXXA Contact with other hot metals, initial encounter: Secondary | ICD-10-CM | POA: Diagnosis not present

## 2021-12-10 NOTE — ED Triage Notes (Signed)
Pt reports getting a burn on the palm of his right hand around 7pm at work tonight.  Pt has full use of hand.   ?

## 2022-01-28 ENCOUNTER — Other Ambulatory Visit (HOSPITAL_COMMUNITY): Payer: Self-pay

## 2022-01-28 ENCOUNTER — Other Ambulatory Visit: Payer: Self-pay

## 2022-01-30 ENCOUNTER — Other Ambulatory Visit: Payer: Self-pay

## 2022-02-02 ENCOUNTER — Other Ambulatory Visit: Payer: Self-pay

## 2022-02-22 ENCOUNTER — Other Ambulatory Visit (HOSPITAL_COMMUNITY): Payer: Self-pay

## 2022-02-22 ENCOUNTER — Other Ambulatory Visit: Payer: Self-pay | Admitting: Nurse Practitioner

## 2022-03-02 ENCOUNTER — Ambulatory Visit: Payer: Self-pay | Admitting: Nurse Practitioner

## 2022-03-08 ENCOUNTER — Ambulatory Visit: Payer: Self-pay | Admitting: Nurse Practitioner

## 2022-03-08 ENCOUNTER — Other Ambulatory Visit (HOSPITAL_COMMUNITY): Payer: Self-pay

## 2022-04-20 ENCOUNTER — Emergency Department (HOSPITAL_COMMUNITY)
Admission: EM | Admit: 2022-04-20 | Discharge: 2022-04-20 | Disposition: A | Discharge status: Home / Self Care | Payer: Self-pay | Attending: Emergency Medicine | Admitting: Emergency Medicine

## 2022-04-20 ENCOUNTER — Other Ambulatory Visit (HOSPITAL_COMMUNITY): Payer: Self-pay

## 2022-04-20 ENCOUNTER — Other Ambulatory Visit: Payer: Self-pay

## 2022-04-20 ENCOUNTER — Encounter (HOSPITAL_COMMUNITY): Payer: Self-pay

## 2022-04-20 DIAGNOSIS — E1165 Type 2 diabetes mellitus with hyperglycemia: Secondary | ICD-10-CM | POA: Insufficient documentation

## 2022-04-20 DIAGNOSIS — Z760 Encounter for issue of repeat prescription: Secondary | ICD-10-CM

## 2022-04-20 DIAGNOSIS — Z7984 Long term (current) use of oral hypoglycemic drugs: Secondary | ICD-10-CM | POA: Insufficient documentation

## 2022-04-20 DIAGNOSIS — R739 Hyperglycemia, unspecified: Secondary | ICD-10-CM

## 2022-04-20 DIAGNOSIS — Z794 Long term (current) use of insulin: Secondary | ICD-10-CM | POA: Insufficient documentation

## 2022-04-20 DIAGNOSIS — R0602 Shortness of breath: Secondary | ICD-10-CM | POA: Insufficient documentation

## 2022-04-20 DIAGNOSIS — F17210 Nicotine dependence, cigarettes, uncomplicated: Secondary | ICD-10-CM | POA: Insufficient documentation

## 2022-04-20 LAB — CBC
HCT: 41.3 % (ref 39.0–52.0)
Hemoglobin: 13.7 g/dL (ref 13.0–17.0)
MCH: 29.8 pg (ref 26.0–34.0)
MCHC: 33.2 g/dL (ref 30.0–36.0)
MCV: 90 fL (ref 80.0–100.0)
Platelets: 244 10*3/uL (ref 150–400)
RBC: 4.59 MIL/uL (ref 4.22–5.81)
RDW: 13.3 % (ref 11.5–15.5)
WBC: 6.6 10*3/uL (ref 4.0–10.5)
nRBC: 0 % (ref 0.0–0.2)

## 2022-04-20 LAB — URINALYSIS, ROUTINE W REFLEX MICROSCOPIC
Bilirubin Urine: NEGATIVE
Glucose, UA: >=500 mg/dL — AB
Hgb urine dipstick: NEGATIVE
Ketones, ur: 20 mg/dL — AB
Leukocytes,Ua: NEGATIVE
Nitrite: NEGATIVE
Protein, ur: NEGATIVE mg/dL
Specific Gravity, Urine: 1.029 (ref 1.005–1.030)
pH: 5 (ref 5.0–8.0)

## 2022-04-20 LAB — BASIC METABOLIC PANEL
Anion gap: 8 (ref 5–15)
BUN: 17 mg/dL (ref 6–20)
CO2: 23 mmol/L (ref 22–32)
Calcium: 8.9 mg/dL (ref 8.9–10.3)
Chloride: 105 mmol/L (ref 98–111)
Creatinine, Ser: 0.8 mg/dL (ref 0.61–1.24)
GFR, Estimated: >60 mL/min (ref >60)
Glucose, Bld: 495 mg/dL — ABNORMAL HIGH (ref 70–99)
Potassium: 4 mmol/L (ref 3.5–5.1)
Sodium: 136 mmol/L (ref 135–145)

## 2022-04-20 LAB — CBG MONITORING, ED
Glucose-Capillary: 500 mg/dL — ABNORMAL HIGH (ref 70–99)
Glucose-Capillary: 131 mg/dL — ABNORMAL HIGH (ref 70–99)

## 2022-04-20 MED ORDER — BASAGLAR KWIKPEN 100 UNIT/ML ~~LOC~~ SOPN
20.0000 [IU] | PEN_INJECTOR | Freq: Every day | SUBCUTANEOUS | 0 refills | Status: DC
  Filled 2022-04-20: qty 6, 30d supply, fill #0
  Filled 2022-04-20 – 2022-06-03 (×2): qty 15, 75d supply, fill #0

## 2022-04-20 MED ORDER — "INSULIN SYRINGE-NEEDLE U-100 30G X 5/16"" 0.3 ML MISC"
30.0000 [IU] | Freq: Two times a day (BID) | 11 refills | Status: DC
  Filled 2022-04-20: qty 100, fill #0
  Filled 2022-06-13: qty 50, 25d supply, fill #0
  Filled 2022-06-13: qty 100, 30d supply, fill #0

## 2022-04-20 MED ORDER — INSULIN ASPART 100 UNIT/ML IJ SOLN
10.0000 [IU] | Freq: Once | INTRAMUSCULAR | Status: AC
  Administered 2022-04-20: 10 [IU] via INTRAVENOUS
  Filled 2022-04-20: qty 0.1

## 2022-04-20 MED ORDER — PEN NEEDLES 31G X 6 MM MISC
1.0000 "pen " | Freq: Three times a day (TID) | 11 refills | Status: DC
  Filled 2022-04-20 – 2022-06-13 (×3): qty 100, 30d supply, fill #0
  Filled 2022-10-10 – 2022-10-12 (×2): qty 100, 30d supply, fill #1

## 2022-04-20 MED ORDER — LACTATED RINGERS IV BOLUS
1000.0000 mL | Freq: Once | INTRAVENOUS | Status: AC
  Administered 2022-04-20: 1000 mL via INTRAVENOUS

## 2022-04-20 MED ORDER — INSULIN LISPRO (1 UNIT DIAL) 100 UNIT/ML (KWIKPEN)
30.0000 [IU] | PEN_INJECTOR | Freq: Two times a day (BID) | SUBCUTANEOUS | 0 refills | Status: DC
  Filled 2022-04-20 – 2022-06-03 (×3): qty 15, 25d supply, fill #0

## 2022-04-20 NOTE — ED Notes (Addendum)
error 

## 2022-04-20 NOTE — Progress Notes (Addendum)
CSW contacted pt and registration to confirm that he does not have any health insurance. Registration informed CSW that he does not have insurance under [place].   CSW contacted RN CM, Anola Gurney, to assist with Medications. RNCM, Burna Mortimer, asked this CSW to secure chat the EDP to request that prescriptions are sent to [facility] pharmacy since he is already connected there. EDP will send prescriptions there. TOC signing off.   Judi Saa, MSW, LCSW-A  [facility]  Transitions of Care [email or web address]

## 2022-04-20 NOTE — ED Provider Triage Note (Signed)
Emergency Medicine Provider Triage Evaluation Note  Dakota Kim , a 51 y.o. male  was evaluated in triage.  Pt complains of elevated blood sugar, also feeling generally unwell.  States that due to an issue getting his insulin, he is cut the dose back and has not had insulin in 2 days.  Denies nausea or vomiting.  States that he was "breathing heavy" this morning.  Review of Systems  Positive: Generalized weakness, polyuria Negative: Fever  Physical Exam  BP (!) 158/89 (BP Location: Right Arm)   Pulse (!) 103   Temp 98.5 F (36.9 C) (Oral)   Resp 18   Ht 5\' 5"  (1.651 m)   Wt 77.1 kg   SpO2 99%   BMI 28.29 kg/m  Gen:   Awake, no distress   Resp:  Normal effort  MSK:   Moves extremities without difficulty  Other:  normal unlabored respirations, borderline tachycardia  Medical Decision Making  Medically screening exam initiated at 10:54 AM.  Appropriate orders placed.  Karandeep Pickrel was informed that the remainder of the evaluation will be completed by another provider, this initial triage assessment does not replace that evaluation, and the importance of remaining in the ED until their evaluation is complete.    , PA-C 04/20/22 1059

## 2022-04-20 NOTE — Discharge Instructions (Signed)
We evaluated you in the emergency department today for hyperglycemia.  Blood sugar was high, but the rest of your tests were normal.  Your blood sugar improved with insulin and IV fluids.  Please pick up your insulin at the pharmacy and take it as prescribed to avoid having to come back to the emergency department.  Please return to the emergency department if you develop any difficulty breathing, fevers, vomiting, dehydration, or any other concerning symptoms.

## 2022-04-20 NOTE — ED Notes (Signed)
Patient given discharge instructions, all questions answered. Patient in possession of all belongings, directed to the discharge area  

## 2022-04-20 NOTE — ED Provider Notes (Signed)
[facility] DEPT Provider Note  CSN: [id number] Arrival date & time: 04/20/22 1018  Chief Complaint(s) Hyperglycemia  HPI Dakota Kim is a 51 y.o. male with history of diabetes presenting to the emergency department with high blood sugar.  Patient reports associated mild shortness of breath.  He reports for the last 2 weeks, he has been taking half doses of his insulin as he has been unable to afford refills.  He denies fevers, chills, headache, nausea, vomiting, chest pain, abdominal pain, leg swelling.  He reports symptoms are mild.  He reports before today, he did not have any symptoms.  Past Medical History Past Medical History:  Diagnosis Date   Diabetes mellitus without complication ([place])    Vitamin D deficiency 09/2019   Patient Active Problem List   Diagnosis Date Noted   Hyperglycemia 09/24/2019   Hemoglobin A1C greater than 9%, indicating poor diabetic control 09/24/2019   Hyperlipidemia 02/26/2019   Class 2 severe obesity due to excess calories with serious comorbidity and body mass index (BMI) of 35.0 to 35.9 in adult [facility]) 02/26/2019   Type 2 diabetes mellitus without complication, with long-term current use of insulin ([place]) 05/13/2017   DKA (diabetic ketoacidoses) 11/06/2012   Hypokalemia 11/06/2012   Home Medication(s) Prior to Admission medications   Medication Sig Start Date End Date Taking? Authorizing Provider  atorvastatin (LIPITOR) 10 MG tablet TAKE 1 TABLET (10 MG TOTAL) BY MOUTH DAILY. 11/17/20 11/17/21  Azzie Glatter, FNP  blood glucose meter kit and supplies KIT Dispense based on patient and insurance preference. Check blood sugar up to four times daily or as needed. ICD 10 E11.9 11/18/20   Azzie Glatter, FNP  glimepiride (AMARYL) 4 MG tablet TAKE 1 TABLET (4 MG TOTAL) BY MOUTH DAILY WITH BREAKFAST. 11/17/20 11/17/21  Azzie Glatter, FNP  glipiZIDE (GLUCOTROL) 10 MG tablet TAKE 1 TABLET (10 MG TOTAL) BY MOUTH 2 (TWO) TIMES  DAILY BEFORE A MEAL. 11/17/20 11/17/21  Azzie Glatter, FNP  INS SYRINGE/NEEDLE 1CC/28G 28G X 1/2" 1 ML MISC 1 applicator by Does not apply route See admin instructions. 04/20/22   Cristie Hem, MD  Insulin Glargine (BASAGLAR KWIKPEN) 100 UNIT/ML Inject 20 Units into the skin at bedtime. 04/20/22   Cristie Hem, MD  insulin lispro (HUMALOG KWIKPEN) 100 UNIT/ML KwikPen Inject 30 Units into the skin in the morning and at bedtime. 04/20/22   Cristie Hem, MD  Insulin Pen Needle (PEN NEEDLES) 31G X 6 MM MISC use as directed 3 (three) times daily. 04/20/22   Cristie Hem, MD  sildenafil (VIAGRA) 100 MG tablet Take 1 tablet (100 mg total) by mouth daily as needed for erectile dysfunction. 03/24/21   Vevelyn Francois, NP  ferrous sulfate 325 (65 FE) MG tablet Take 1 tablet (325 mg total) by mouth daily. Patient not taking: Reported on 09/23/2019 08/28/18 07/28/20  Azzie Glatter, FNP  Insulin Lispro Prot & Lispro (HUMALOG MIX 75/25 KWIKPEN) (75-25) 100 UNIT/ML Kwikpen Inject 45 Units into the skin 2 (two) times daily with breakfast and lunch. 10/13/20 10/13/20  Tedd Sias, PA  Past Surgical History History reviewed. No pertinent surgical history. Family History Family History  Problem Relation Age of Onset   Diabetes Mellitus II Father     Social History Social History   Tobacco Use   Smoking status: Every Day    Packs/day: 0.15    Types: Cigarettes    Last attempt to quit: 10/08/2012    Years since quitting: 9.5   Smokeless tobacco: Never  Vaping Use   Vaping Use: Never used  Substance Use Topics   Alcohol use: Yes    Comment: occassionally   Drug use: No   Allergies Patient has no known allergies.  Review of Systems Review of Systems  All other systems reviewed and are negative.   Physical Exam Vital Signs  I have reviewed  the triage vital signs BP (!) 153/96   Pulse 99   Temp 98.5 F (36.9 C) (Oral)   Resp 20   Ht _0  (1.651 m)   Wt 77.1 kg   SpO2 99%   BMI 28.29 kg/m   Physical Exam Vitals and nursing note reviewed.  Constitutional:      General: He is not in acute distress.    Appearance: Normal appearance.  HENT:     Mouth/Throat:     Mouth: Mucous membranes are moist.  Cardiovascular:     Rate and Rhythm: Normal rate and regular rhythm.  Pulmonary:     Effort: Pulmonary effort is normal. No respiratory distress.     Breath sounds: Normal breath sounds.  Abdominal:     General: Abdomen is flat.     Palpations: Abdomen is soft.     Tenderness: There is no abdominal tenderness.  Skin:    General: Skin is warm and dry.     Capillary Refill: Capillary refill takes less than 2 seconds.  Neurological:     Mental Status: He is alert and oriented to person, place, and time. Mental status is at baseline.  Psychiatric:        Mood and Affect: Mood normal.        Behavior: Behavior normal.     ED Results and Treatments Labs (all labs ordered are listed, but only abnormal results are displayed) Labs Reviewed  BASIC METABOLIC PANEL - Abnormal; Notable for the following components:      Result Value   Glucose, Bld 495 (*)    All other components within normal limits  URINALYSIS, ROUTINE W REFLEX MICROSCOPIC - Abnormal; Notable for the following components:   Color, Urine STRAW (*)    Glucose, UA >=500 (*)    Ketones, ur 20 (*)    Bacteria, UA RARE (*)    All other components within normal limits  CBG MONITORING, ED - Abnormal; Notable for the following components:   Glucose-Capillary 500 (*)    All other components within normal limits  CBG MONITORING, ED - Abnormal; Notable for the following components:   Glucose-Capillary 131 (*)    All other components within normal limits  CBC  CBG MONITORING, ED  CBG MONITORING, ED  Radiology No results found.  Pertinent labs & imaging results that were available during my care of the patient were reviewed by me and considered in my medical decision making (see MDM for details).  Medications Ordered in ED Medications  lactated ringers bolus 1,000 mL (0 mLs Intravenous Stopped 04/20/22 1427)  insulin aspart (novoLOG) injection 10 Units (10 Units Intravenous Given 04/20/22 1240)                                                                                                                                     Procedures .1-3 Lead EKG Interpretation  Performed by: Cristie Hem, MD Authorized by: Cristie Hem, MD     Interpretation: normal     ECG rate assessment: normal     Rhythm: sinus rhythm     Ectopy: none     (including critical care time)  Medical Decision Making / ED Course   This patient presents to the ED for concern of hyperglycemia, this involves an extensive number of treatment options, and is a complaint that carries with it a high risk of complications and morbidity.  The differential diagnosis includes diabetic ketoacidosis, infection, medication noncompliance, HHS  MDM:   51 year old male presenting to the emergency department with hyperglycemia.  Patient well-appearing, vitals notable for initial mild tachycardia, but exam reassuring  Will check metabolic labs, give IV fluids, consult social work to help patient get home medication.  Will evaluate for signs of DKA on labs.  Patient has no infectious symptoms to suggest occult infectious process.  Normal mental status, doubt HHS.  Will reassess.  Clinical Course as of 04/20/22 1441  Thu Apr 20, 2022  1440 Labs without sign of gap suggestive of DKA. Hyperglycemia significantly improved with IV fluids, insulin. Patient tolerating PO. Sent refill of insulin to pharmacy, discussed with social work and patient can afford  refill if sent here. Will discharge patient to home. All questions answered. Patient comfortable with plan of discharge. Return precautions discussed with patient and specified on the after visit summary.  [WS]    Clinical Course User Index [WS] Cristie Hem, MD     Additional history obtained: -Additional history obtained from chart review -External records from outside source obtained and reviewed including: Chart review including previous notes, labs, imaging, consultation notes   Lab Tests: -I ordered, reviewed, and interpreted labs.   The pertinent results include:   Labs Reviewed  BASIC METABOLIC PANEL - Abnormal; Notable for the following components:      Result Value   Glucose, Bld 495 (*)    All other components within normal limits  URINALYSIS, ROUTINE W REFLEX MICROSCOPIC - Abnormal; Notable for the following components:   Color, Urine STRAW (*)    Glucose, UA >=500 (*)    Ketones, ur 20 (*)    Bacteria, UA RARE (*)    All other components within normal limits  CBG  MONITORING, ED - Abnormal; Notable for the following components:   Glucose-Capillary 500 (*)    All other components within normal limits  CBG MONITORING, ED - Abnormal; Notable for the following components:   Glucose-Capillary 131 (*)    All other components within normal limits  CBC  CBG MONITORING, ED  CBG MONITORING, ED        Medicines ordered and prescription drug management: Meds ordered this encounter  Medications   lactated ringers bolus 1,000 mL   insulin aspart (novoLOG) injection 10 Units   INS SYRINGE/NEEDLE 1CC/28G 28G X 1/2" 1 ML MISC    Sig: 1 applicator by Does not apply route See admin instructions.    Dispense:  100 each    Refill:  11   Insulin Glargine (BASAGLAR KWIKPEN) 100 UNIT/ML    Sig: Inject 20 Units into the skin at bedtime.    Dispense:  15 mL    Refill:  0   Insulin Pen Needle (PEN NEEDLES) 31G X 6 MM MISC    Sig: use as directed 3 (three) times daily.     Dispense:  100 each    Refill:  11   insulin lispro (HUMALOG KWIKPEN) 100 UNIT/ML KwikPen    Sig: Inject 30 Units into the skin in the morning and at bedtime.    Dispense:  15 mL    Refill:  0    -I have reviewed the patients home medicines and have made adjustments as needed  Critical interventions IV insulin  Cardiac Monitoring: The patient was maintained on a cardiac monitor.  I personally viewed and interpreted the cardiac monitored which showed an underlying rhythm of: NSR  Social Determinants of Health:  Factors impacting patients care include: unable to afford medications   Reevaluation: After the interventions noted above, I reevaluated the patient and found that they have :improved  Co morbidities that complicate the patient evaluation  Past Medical History:  Diagnosis Date   Diabetes mellitus without complication ([place])    Vitamin D deficiency 09/2019      Dispostion: I considered admission for this patient, given reassuring lab work and response of hypoglycemia to an insulin, patient stable for discharged home with close outpatient follow-up.     Final Clinical Impression(s) / ED Diagnoses Final diagnoses:  Hyperglycemia     This chart was dictated using voice recognition software.  Despite best efforts to proofread,  errors can occur which can change the documentation meaning.    Cristie Hem, MD 04/20/22 [phone]

## 2022-04-24 ENCOUNTER — Other Ambulatory Visit: Payer: Self-pay

## 2022-04-27 ENCOUNTER — Other Ambulatory Visit: Payer: Self-pay

## 2022-06-05 ENCOUNTER — Other Ambulatory Visit (HOSPITAL_COMMUNITY): Payer: Self-pay

## 2022-06-06 ENCOUNTER — Emergency Department (HOSPITAL_COMMUNITY)
Admission: EM | Admit: 2022-06-06 | Discharge: 2022-06-06 | Discharge status: Against Medical Advice | Payer: Self-pay | Attending: Emergency Medicine | Admitting: Emergency Medicine

## 2022-06-06 ENCOUNTER — Encounter (HOSPITAL_COMMUNITY): Payer: Self-pay

## 2022-06-06 ENCOUNTER — Other Ambulatory Visit (HOSPITAL_COMMUNITY): Payer: Self-pay

## 2022-06-06 DIAGNOSIS — Z5321 Procedure and treatment not carried out due to patient leaving prior to being seen by health care provider: Secondary | ICD-10-CM | POA: Insufficient documentation

## 2022-06-06 DIAGNOSIS — E1165 Type 2 diabetes mellitus with hyperglycemia: Secondary | ICD-10-CM | POA: Insufficient documentation

## 2022-06-06 LAB — URINALYSIS, ROUTINE W REFLEX MICROSCOPIC
Bacteria, UA: NONE SEEN
Bilirubin Urine: NEGATIVE
Glucose, UA: >=500 mg/dL — AB
Hgb urine dipstick: NEGATIVE
Ketones, ur: 80 mg/dL — AB
Leukocytes,Ua: NEGATIVE
Nitrite: NEGATIVE
Protein, ur: NEGATIVE mg/dL
Specific Gravity, Urine: 1.025 (ref 1.005–1.030)
pH: 5 (ref 5.0–8.0)

## 2022-06-06 LAB — CBC WITH DIFFERENTIAL/PLATELET
Abs Immature Granulocytes: 0.03 10*3/uL (ref 0.00–0.07)
Basophils Absolute: 0 10*3/uL (ref 0.0–0.1)
Basophils Relative: 1 %
Eosinophils Absolute: 0 10*3/uL (ref 0.0–0.5)
Eosinophils Relative: 0 %
HCT: 47.3 % (ref 39.0–52.0)
Hemoglobin: 15.5 g/dL (ref 13.0–17.0)
Immature Granulocytes: 0 %
Lymphocytes Relative: 27 %
Lymphs Abs: 1.9 10*3/uL (ref 0.7–4.0)
MCH: 29.9 pg (ref 26.0–34.0)
MCHC: 32.8 g/dL (ref 30.0–36.0)
MCV: 91.1 fL (ref 80.0–100.0)
Monocytes Absolute: 0.5 10*3/uL (ref 0.1–1.0)
Monocytes Relative: 7 %
Neutro Abs: 4.5 10*3/uL (ref 1.7–7.7)
Neutrophils Relative %: 65 %
Platelets: 231 10*3/uL (ref 150–400)
RBC: 5.19 MIL/uL (ref 4.22–5.81)
RDW: 13.2 % (ref 11.5–15.5)
WBC: 6.9 10*3/uL (ref 4.0–10.5)
nRBC: 0 % (ref 0.0–0.2)

## 2022-06-06 LAB — BLOOD GAS, VENOUS
Acid-base deficit: 8.4 mmol/L — ABNORMAL HIGH (ref 0.0–2.0)
Bicarbonate: 17.9 mmol/L — ABNORMAL LOW (ref 20.0–28.0)
O2 Saturation: 84 %
Patient temperature: 36.1
pCO2, Ven: 37 mmHg — ABNORMAL LOW (ref 44–60)
pH, Ven: 7.28 (ref 7.25–7.43)
pO2, Ven: 51 mmHg — ABNORMAL HIGH (ref 32–45)

## 2022-06-06 LAB — COMPREHENSIVE METABOLIC PANEL
ALT: 21 U/L (ref 0–44)
AST: 16 U/L (ref 15–41)
Albumin: 4.2 g/dL (ref 3.5–5.0)
Alkaline Phosphatase: 71 U/L (ref 38–126)
Anion gap: 13 (ref 5–15)
BUN: 22 mg/dL — ABNORMAL HIGH (ref 6–20)
CO2: 19 mmol/L — ABNORMAL LOW (ref 22–32)
Calcium: 9.3 mg/dL (ref 8.9–10.3)
Chloride: 100 mmol/L (ref 98–111)
Creatinine, Ser: 0.93 mg/dL (ref 0.61–1.24)
GFR, Estimated: >60 mL/min (ref >60)
Glucose, Bld: 524 mg/dL (ref 70–99)
Potassium: 4.8 mmol/L (ref 3.5–5.1)
Sodium: 132 mmol/L — ABNORMAL LOW (ref 135–145)
Total Bilirubin: 1.2 mg/dL (ref 0.3–1.2)
Total Protein: 7.5 g/dL (ref 6.5–8.1)

## 2022-06-06 LAB — CBG MONITORING, ED
Glucose-Capillary: 594 mg/dL (ref 70–99)
Glucose-Capillary: 452 mg/dL — ABNORMAL HIGH (ref 70–99)

## 2022-06-06 LAB — BETA-HYDROXYBUTYRIC ACID: Beta-Hydroxybutyric Acid: 5.26 mmol/L — ABNORMAL HIGH (ref 0.05–0.27)

## 2022-06-06 NOTE — ED Provider Triage Note (Signed)
Emergency Medicine Provider Triage Evaluation Note  Dakota Kim , a 51 y.o. male  was evaluated in triage.  Pt complains of hyperglycemia.  Patient ran out of insulin 3 weeks ago.  History of type 2 diabetes.  Denies nausea, vomiting, diarrhea.  No abdominal pain.  Review of Systems  Positive: hyperglycemia Negative: Abdominal pain  Physical Exam  BP (!) 125/90 (BP Location: Left Arm)   Pulse 100   Temp 98 F (36.7 C) (Oral)   Resp 16   SpO2 100%  Gen:   Awake, no distress   Resp:  Normal effort  MSK:   Moves extremities without difficulty  Other:    Medical Decision Making  Medically screening exam initiated at 11:59 AM.  Appropriate orders placed.  Lehman Tatum was informed that the remainder of the evaluation will be completed by another provider, this initial triage assessment does not replace that evaluation, and the importance of remaining in the ED until their evaluation is complete.  Hyperglycemia labs. CBG 594   Suzy Bouchard, [place] 06/06/22 1201

## 2022-06-06 NOTE — ED Triage Notes (Addendum)
Pt arrived via POV, c/o hyperglycemia, has been out of insulin x3 wks due to insurance issues. Denies any n/v.

## 2022-06-08 ENCOUNTER — Other Ambulatory Visit (HOSPITAL_COMMUNITY): Payer: Self-pay

## 2022-06-08 ENCOUNTER — Encounter (HOSPITAL_COMMUNITY): Payer: Self-pay | Admitting: Pharmacist

## 2022-06-13 ENCOUNTER — Emergency Department (HOSPITAL_COMMUNITY): Payer: Self-pay

## 2022-06-13 ENCOUNTER — Other Ambulatory Visit: Payer: Self-pay

## 2022-06-13 ENCOUNTER — Encounter (HOSPITAL_COMMUNITY): Payer: Self-pay

## 2022-06-13 ENCOUNTER — Emergency Department (HOSPITAL_COMMUNITY)
Admission: EM | Admit: 2022-06-13 | Discharge: 2022-06-13 | Disposition: A | Discharge status: Home / Self Care | Payer: Self-pay | Attending: Emergency Medicine | Admitting: Emergency Medicine

## 2022-06-13 ENCOUNTER — Other Ambulatory Visit (HOSPITAL_COMMUNITY): Payer: Self-pay

## 2022-06-13 DIAGNOSIS — E1165 Type 2 diabetes mellitus with hyperglycemia: Secondary | ICD-10-CM | POA: Insufficient documentation

## 2022-06-13 DIAGNOSIS — R739 Hyperglycemia, unspecified: Secondary | ICD-10-CM

## 2022-06-13 DIAGNOSIS — Z20822 Contact with and (suspected) exposure to covid-19: Secondary | ICD-10-CM | POA: Insufficient documentation

## 2022-06-13 DIAGNOSIS — E119 Type 2 diabetes mellitus without complications: Secondary | ICD-10-CM

## 2022-06-13 DIAGNOSIS — Z794 Long term (current) use of insulin: Secondary | ICD-10-CM | POA: Insufficient documentation

## 2022-06-13 DIAGNOSIS — F17210 Nicotine dependence, cigarettes, uncomplicated: Secondary | ICD-10-CM | POA: Insufficient documentation

## 2022-06-13 LAB — COMPREHENSIVE METABOLIC PANEL
ALT: 15 U/L (ref 0–44)
AST: 12 U/L — ABNORMAL LOW (ref 15–41)
Albumin: 3.1 g/dL — ABNORMAL LOW (ref 3.5–5.0)
Alkaline Phosphatase: 54 U/L (ref 38–126)
Anion gap: 9 (ref 5–15)
BUN: 14 mg/dL (ref 6–20)
CO2: 23 mmol/L (ref 22–32)
Calcium: 8.5 mg/dL — ABNORMAL LOW (ref 8.9–10.3)
Chloride: 103 mmol/L (ref 98–111)
Creatinine, Ser: 0.83 mg/dL (ref 0.61–1.24)
GFR, Estimated: >60 mL/min (ref >60)
Glucose, Bld: 513 mg/dL (ref 70–99)
Potassium: 4.1 mmol/L (ref 3.5–5.1)
Sodium: 135 mmol/L (ref 135–145)
Total Bilirubin: 0.9 mg/dL (ref 0.3–1.2)
Total Protein: 5.7 g/dL — ABNORMAL LOW (ref 6.5–8.1)

## 2022-06-13 LAB — CBC WITH DIFFERENTIAL/PLATELET
Abs Immature Granulocytes: 0.03 10*3/uL (ref 0.00–0.07)
Basophils Absolute: 0 10*3/uL (ref 0.0–0.1)
Basophils Relative: 1 %
Eosinophils Absolute: 0 10*3/uL (ref 0.0–0.5)
Eosinophils Relative: 0 %
HCT: 42.6 % (ref 39.0–52.0)
Hemoglobin: 14 g/dL (ref 13.0–17.0)
Immature Granulocytes: 1 %
Lymphocytes Relative: 25 %
Lymphs Abs: 1.3 10*3/uL (ref 0.7–4.0)
MCH: 30 pg (ref 26.0–34.0)
MCHC: 32.9 g/dL (ref 30.0–36.0)
MCV: 91.2 fL (ref 80.0–100.0)
Monocytes Absolute: 0.5 10*3/uL (ref 0.1–1.0)
Monocytes Relative: 9 %
Neutro Abs: 3.4 10*3/uL (ref 1.7–7.7)
Neutrophils Relative %: 64 %
Platelets: 201 10*3/uL (ref 150–400)
RBC: 4.67 MIL/uL (ref 4.22–5.81)
RDW: 13.2 % (ref 11.5–15.5)
WBC: 5.3 10*3/uL (ref 4.0–10.5)
nRBC: 0 % (ref 0.0–0.2)

## 2022-06-13 LAB — URINALYSIS, ROUTINE W REFLEX MICROSCOPIC
Bilirubin Urine: NEGATIVE
Glucose, UA: NEGATIVE mg/dL
Hgb urine dipstick: NEGATIVE
Ketones, ur: NEGATIVE mg/dL
Leukocytes,Ua: NEGATIVE
Nitrite: NEGATIVE
Protein, ur: NEGATIVE mg/dL
Specific Gravity, Urine: 1.015 (ref 1.005–1.030)
pH: 5 (ref 5.0–8.0)

## 2022-06-13 LAB — BLOOD GAS, VENOUS
Acid-base deficit: 1.5 mmol/L (ref 0.0–2.0)
Bicarbonate: 24.3 mmol/L (ref 20.0–28.0)
O2 Saturation: 83.2 %
Patient temperature: 37
pCO2, Ven: 44 mmHg (ref 44–60)
pH, Ven: 7.35 (ref 7.25–7.43)
pO2, Ven: 49 mmHg — ABNORMAL HIGH (ref 32–45)

## 2022-06-13 LAB — CBG MONITORING, ED
Glucose-Capillary: >600 mg/dL (ref 70–99)
Glucose-Capillary: 301 mg/dL — ABNORMAL HIGH (ref 70–99)
Glucose-Capillary: 341 mg/dL — ABNORMAL HIGH (ref 70–99)
Glucose-Capillary: 382 mg/dL — ABNORMAL HIGH (ref 70–99)
Glucose-Capillary: 209 mg/dL — ABNORMAL HIGH (ref 70–99)

## 2022-06-13 LAB — BETA-HYDROXYBUTYRIC ACID: Beta-Hydroxybutyric Acid: 2 mmol/L — ABNORMAL HIGH (ref 0.05–0.27)

## 2022-06-13 LAB — OSMOLALITY: Osmolality: 305 mOsm/kg — ABNORMAL HIGH (ref 275–295)

## 2022-06-13 LAB — RESP PANEL BY RT-PCR (FLU A&B, COVID) ARPGX2
Influenza A by PCR: NEGATIVE
Influenza B by PCR: NEGATIVE
SARS Coronavirus 2 by RT PCR: NEGATIVE

## 2022-06-13 LAB — MAGNESIUM: Magnesium: 2.1 mg/dL (ref 1.7–2.4)

## 2022-06-13 MED ORDER — INSULIN ASPART 100 UNIT/ML IJ SOLN
10.0000 [IU] | Freq: Once | INTRAMUSCULAR | Status: DC
  Filled 2022-06-13: qty 0.1

## 2022-06-13 MED ORDER — INSULIN ASPART 100 UNIT/ML IJ SOLN
5.0000 [IU] | Freq: Once | INTRAMUSCULAR | Status: AC
  Administered 2022-06-13: 5 [IU] via SUBCUTANEOUS
  Filled 2022-06-13: qty 0.05

## 2022-06-13 MED ORDER — LACTATED RINGERS IV BOLUS
1000.0000 mL | Freq: Once | INTRAVENOUS | Status: AC
  Administered 2022-06-13: 1000 mL via INTRAVENOUS

## 2022-06-13 MED ORDER — INSULIN ASPART 100 UNIT/ML IJ SOLN
30.0000 [IU] | Freq: Once | INTRAMUSCULAR | Status: AC
  Administered 2022-06-13: 30 [IU] via SUBCUTANEOUS
  Filled 2022-06-13: qty 0.3

## 2022-06-13 MED ORDER — BASAGLAR KWIKPEN 100 UNIT/ML ~~LOC~~ SOPN
20.0000 [IU] | PEN_INJECTOR | Freq: Every day | SUBCUTANEOUS | 0 refills | Status: DC
  Filled 2022-06-13: qty 6, 30d supply, fill #0
  Filled 2022-06-13: qty 15, 75d supply, fill #0
  Filled 2022-06-13: qty 6, 30d supply, fill #0

## 2022-06-13 MED ORDER — LACTATED RINGERS IV SOLN: INTRAVENOUS | Status: DC

## 2022-06-13 MED ORDER — INSULIN ASPART 100 UNIT/ML IJ SOLN
30.0000 [IU] | Freq: Two times a day (BID) | INTRAMUSCULAR | 11 refills | Status: DC
  Filled 2022-06-13 (×3): qty 10, 17d supply, fill #0

## 2022-06-13 MED ORDER — INSULIN ASPART 100 UNIT/ML IJ SOLN
30.0000 [IU] | Freq: Two times a day (BID) | INTRAMUSCULAR | Status: DC
  Filled 2022-06-13: qty 0.3

## 2022-06-13 NOTE — Progress Notes (Signed)
This CSW has provided the pt with the [facility] letter to take to [place] to have prescriptions filled. TOC signing off.

## 2022-06-13 NOTE — Inpatient Diabetes Management (Addendum)
Inpatient Diabetes Program Recommendations  AACE/ADA: New Consensus Statement on Inpatient Glycemic Control (2015)  Target Ranges:  Prepandial:   less than 140 mg/dL      Peak postprandial:   less than 180 mg/dL (1-2 hours)      Critically ill patients:  140 - 180 mg/dL   Lab Results  Component Value Date   GLUCAP >600 (HH) 06/13/2022   HGBA1C 9.6 (A) 03/24/2021    Review of Glycemic Control  Latest Reference Range & Units 06/13/22 06:31  Glucose 70 - 99 mg/dL 513 (HH)  (HH): Data is critically high   Latest Reference Range & Units 06/13/22 04:39  Beta-Hydroxybutyric Acid 0.05 - 0.27 mmol/L 2.00 (H)  (H): Data is abnormally high  Diabetes history: DM Outpatient Diabetes medications: Basaglar 20 units QHS, Novolog 30 units BID Current orders for Inpatient glycemic control: Novolog 5 units x 1 and IVF  Inpatient Diabetes Program Recommendations:    Might consider IV insulin as bet-hydroxybutyric is 2.00.  If MD does not start IV insulin,  please consider:  Semglee 20 units QD (starting now) Novolog 0-15 units TID and 0-5 units QHS  Spoke with patient at bedside.  He has not taken any insulin in over a month.  This DM coordinator reviewed his My Chart to confirm above prescribed medications.  He has a PCP appointment schedule for Oct 12th with Lazaro Arms @ [place].  Care Management has also made an appointment with Johny Blamer @ [facility] on October 17th.    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  He does not have a glucometer.    He states he has "Obama Care" and his deductible is $8000.00.  He plans to drop it in [place] and apply for Medicaid.    TOC is helping him with insulins at [place].  He has lost weight over the last month, has had polydipsia and polyuria along with severe fatigue.  Explained basic patho of DM and acidosis.  Explained he needs to administer insulins to  avoid DKA and long and short term complications.    Also explained that if he cannot get his insulins he can go to [facility] and purchase the ReliOn NPH & regular insulins OTC without a prescription for $43 per box.    Addendum @ 11:21: Provided patient with a ReliOn glucometer and supplies.  He is aware he can purchase more strips at [facility] when needed.    Basaglar insulin pen order # [id number] Tobie Lords order # [id number] Insulin pen Needles order # [id number]  Will continue to follow while inpatient.  Thank you, Reche Dixon, MSN, [place] Diabetes Coordinator Inpatient Diabetes Program [phone] (team pager from 8a-5p)

## 2022-06-13 NOTE — ED Triage Notes (Signed)
BIBA from home, states he has been having frequent urination and thirst, can't afford insulin and hasn't had it in a month   20 LW 55ml NS

## 2022-06-13 NOTE — ED Provider Notes (Signed)
[facility]-EMERGENCY DEPT Provider Note   CSN: [id number] Arrival date & time: 06/13/22  0426     History  Chief Complaint  Patient presents with   Hyperglycemia    Dakota Kim is a 51 y.o. male.  Patient as above with significant medical history as below, including IDDM who presents to the ED with complaint of hypoglycemia, polyuria, polydipsia.  Reports he has been unable to fill his medications for diabetes in over a month secondary to insurance issues, unable to afford high cost.  Reports over the past few days he has had increased thirst, increased urination, fatigue.  He does not frequently check his sugar at home but he feels as though his blood sugars very high.  No nausea or vomiting, no fevers or chills, no chest pain or dyspnea.  Denies Recent travel or sick contacts     Past Medical History:  Diagnosis Date   Diabetes mellitus without complication (HCC)    Vitamin D deficiency 09/2019    History reviewed. No pertinent surgical history.   The history is provided by the patient. No language interpreter was used.  Hyperglycemia Associated symptoms: fatigue, increased thirst and polyuria   Associated symptoms: no abdominal pain, no chest pain, no confusion, no fever, no nausea, no shortness of breath and no vomiting        Home Medications Prior to Admission medications   Medication Sig Start Date End Date Taking? Authorizing Provider  atorvastatin (LIPITOR) 10 MG tablet TAKE 1 TABLET (10 MG TOTAL) BY MOUTH DAILY. Patient not taking: Reported on 06/13/2022 11/17/20 11/17/21  Kallie Locks, FNP  glimepiride (AMARYL) 4 MG tablet Take 4 mg by mouth daily with breakfast. Patient not taking: Reported on 06/13/2022    [provider]  glipiZIDE (GLUCOTROL) 10 MG tablet Take 10 mg by mouth daily before breakfast. Patient not taking: Reported on 06/13/2022    [provider]  Insulin Syringe-Needle U-100 30G X 5/16" 0.3 ML MISC  Inject 30 Units into the skin 2 (two) times daily. 04/20/22   Lonell Grandchild, MD  insulin aspart (NOVOLOG) 100 UNIT/ML injection Inject 30 Units into the skin 2 (two) times daily. With breakfast and lunch Patient not taking: Reported on 06/13/2022 06/13/22   Ernie Avena, MD  Insulin Glargine [employer]) 100 UNIT/ML Inject 20 Units into the skin at bedtime. Patient not taking: Reported on 06/13/2022 06/13/22   Ernie Avena, MD  insulin lispro (HUMALOG KWIKPEN) 100 UNIT/ML KwikPen Inject into the skin. Patient not taking: Reported on 06/13/2022    [provider]  Insulin Pen Needle (PEN NEEDLES) 31G X 6 MM MISC Use as directed 3 (three) times daily. 04/20/22   Lonell Grandchild, MD  sildenafil (VIAGRA) 100 MG tablet Take 1 tablet (100 mg total) by mouth daily as needed for erectile dysfunction. Patient not taking: Reported on 06/13/2022 03/24/21   Barbette Merino, NP  ferrous sulfate 325 (65 FE) MG tablet Take 1 tablet (325 mg total) by mouth daily. Patient not taking: Reported on 09/23/2019 08/28/18 07/28/20  Kallie Locks, FNP  Insulin Lispro Prot & Lispro (HUMALOG MIX 75/25 KWIKPEN) (75-25) 100 UNIT/ML Kwikpen Inject 45 Units into the skin 2 (two) times daily with breakfast and lunch. 10/13/20 10/13/20  Gailen Shelter, PA      Allergies    Patient has no known allergies.    Review of Systems   Review of Systems  Constitutional:  Positive for fatigue. Negative for chills and  fever.  HENT:  Negative for facial swelling and trouble swallowing.   Eyes:  Negative for photophobia and visual disturbance.  Respiratory:  Negative for cough and shortness of breath.   Cardiovascular:  Negative for chest pain and palpitations.  Gastrointestinal:  Negative for abdominal pain, nausea and vomiting.  Endocrine: Positive for polydipsia and polyuria.  Genitourinary:  Positive for frequency. Negative for difficulty urinating and hematuria.  Musculoskeletal:  Negative for gait problem  and joint swelling.  Skin:  Negative for pallor and rash.  Neurological:  Negative for syncope and headaches.  Psychiatric/Behavioral:  Negative for agitation and confusion.     Physical Exam Updated Vital Signs BP 120/83   Pulse 80   Temp 98 F (36.7 C)   Resp 15   Ht 5\' 6"  (1.676 m)   Wt 86.2 kg   SpO2 99%   BMI 30.67 kg/m  Physical Exam Vitals and nursing note reviewed.  Constitutional:      General: He is not in acute distress.    Appearance: Normal appearance. He is well-developed. He is not ill-appearing.  HENT:     Head: Normocephalic and atraumatic.     Right Ear: External ear normal.     Left Ear: External ear normal.     Mouth/Throat:     Mouth: Mucous membranes are moist.  Eyes:     General: No scleral icterus. Cardiovascular:     Rate and Rhythm: Normal rate and regular rhythm.     Pulses: Normal pulses.     Heart sounds: Normal heart sounds.  Pulmonary:     Effort: Pulmonary effort is normal. No respiratory distress.     Breath sounds: Normal breath sounds.  Abdominal:     General: Abdomen is flat.     Palpations: Abdomen is soft.     Tenderness: There is no abdominal tenderness. There is no guarding or rebound.  Musculoskeletal:        General: Normal range of motion.     Cervical back: Normal range of motion.     Right lower leg: No edema.     Left lower leg: No edema.  Skin:    General: Skin is warm and dry.     Capillary Refill: Capillary refill takes less than 2 seconds.  Neurological:     Mental Status: He is alert and oriented to person, place, and time.     GCS: GCS eye subscore is 4. GCS verbal subscore is 5. GCS motor subscore is 6.  Psychiatric:        Mood and Affect: Mood normal.        Behavior: Behavior normal.     ED Results / Procedures / Treatments   Labs (all labs ordered are listed, but only abnormal results are displayed) Labs Reviewed  BETA-HYDROXYBUTYRIC ACID - Abnormal; Notable for the following components:       Result Value   Beta-Hydroxybutyric Acid 2.00 (*)    All other components within normal limits  URINALYSIS, ROUTINE W REFLEX MICROSCOPIC - Abnormal; Notable for the following components:   Color, Urine STRAW (*)    All other components within normal limits  BLOOD GAS, VENOUS - Abnormal; Notable for the following components:   pO2, Ven 49 (*)    All other components within normal limits  COMPREHENSIVE METABOLIC PANEL - Abnormal; Notable for the following components:   Glucose, Bld 513 (*)    Calcium 8.5 (*)    Total Protein 5.7 (*)    Albumin  3.1 (*)    AST 12 (*)    All other components within normal limits  OSMOLALITY - Abnormal; Notable for the following components:   Osmolality 305 (*)    All other components within normal limits  CBG MONITORING, ED - Abnormal; Notable for the following components:   Glucose-Capillary >600 (*)    All other components within normal limits  CBG MONITORING, ED - Abnormal; Notable for the following components:   Glucose-Capillary 301 (*)    All other components within normal limits  CBG MONITORING, ED - Abnormal; Notable for the following components:   Glucose-Capillary 382 (*)    All other components within normal limits  CBG MONITORING, ED - Abnormal; Notable for the following components:   Glucose-Capillary 341 (*)    All other components within normal limits  CBG MONITORING, ED - Abnormal; Notable for the following components:   Glucose-Capillary 209 (*)    All other components within normal limits  RESP PANEL BY RT-PCR (FLU A&B, COVID) ARPGX2  CBC WITH DIFFERENTIAL/PLATELET  MAGNESIUM  CBG MONITORING, ED    EKG EKG Interpretation  Date/Time:  Tuesday June 13 2022 04:48:03 EDT Ventricular Rate:  96 PR Interval:  138 QRS Duration: 83 QT Interval:  338 QTC Calculation: 428 R Axis:   53 Text Interpretation: Sinus rhythm Probable left atrial enlargement Anterior infarct, old Confirmed by Ernie Avena ([id number]) on 06/13/2022 9:52:42  AM  Radiology DG Chest Portable 1 View  Result Date: 06/13/2022 CLINICAL DATA:  Weakness and hyperglycemia. EXAM: PORTABLE CHEST 1 VIEW COMPARISON:  None Available. FINDINGS: The heart size and mediastinal contours are within normal limits. Both lungs are clear. The visualized skeletal structures are unremarkable. IMPRESSION: No active disease. Electronically Signed   By: Almira Bar M.D.   On: 06/13/2022 05:11    Procedures Procedures    Medications Ordered in ED Medications  lactated ringers bolus 1,000 mL (0 mLs Intravenous Stopped 06/13/22 0615)  lactated ringers bolus 1,000 mL (0 mLs Intravenous Stopped 06/13/22 1030)  insulin aspart (novoLOG) injection 5 Units (5 Units Subcutaneous Given 06/13/22 0921)  insulin aspart (novoLOG) injection 30 Units (30 Units Subcutaneous Given 06/13/22 1235)    ED Course/ Medical Decision Making/ A&P                           Medical Decision Making Amount and/or Complexity of Data Reviewed Labs: ordered. Radiology: ordered. ECG/medicine tests: ordered.  Risk Prescription drug management.   This patient presents to the ED with chief complaint(s) of platelets and polyuria, fatigue with pertinent past medical history of IDDM which further complicates the presenting complaint. The complaint involves an extensive differential diagnosis and also carries with it a high risk of complications and morbidity.    The differential diagnosis includes but not limited to DKA, hyperglycemic emergency, HHS, metabolic, endocrine, infectious, etc. Serious etiologies were considered.   The initial plan is to screening labs, IV fluids, initial POC glucose greater than 600   Additional history obtained: Additional history obtained from  N/A Records reviewed  prior ED visits, prior labs and imaging, home medications  Independent labs interpretation:  The following labs were independently interpreted:  Beta-hydroxybutyrate 2, glucose over 500, no anion gap,  pH 7.35.  Urinalysis without ketones.  Remainder of labs otherwise pending  Independent visualization of imaging: - I independently visualized the following imaging with scope of interpretation limited to determining acute life threatening conditions related to emergency care: X-ray, which  revealed no acute process  Cardiac monitoring was reviewed and interpreted by myself which shows NSR  Treatment and Reassessment: IV fluids >>  improving  Consultation: - Consulted or discussed management/test interpretation w/ external professional: consult to [facility] for medication assistance as pt reports he has been unable to afford his meds for >1 month  Consideration for admission or further workup: Admission was considered   Well-appearing 51 year old male to the ED secondary to hyperglycemia.  Physical exam is reassuring.  X-ray reassuring.  Does not appear to be in DKA/HHS per initial labs. Favor likely hyperglycemia in setting of being unable to get his diabetes medications.  Patient signed out to incoming EDP pending management of his hyperglycemia and TOC consult for medication management.  He is HDS.  Social Determinants of health: Social History   Tobacco Use   Smoking status: Every Day    Packs/day: 0.15    Types: Cigarettes    Last attempt to quit: 10/08/2012    Years since quitting: 9.6   Smokeless tobacco: Never  Vaping Use   Vaping Use: Never used  Substance Use Topics   Alcohol use: Yes    Comment: occassionally   Drug use: No            Final Clinical Impression(s) / ED Diagnoses Final diagnoses:  Hyperglycemia  Type 2 diabetes mellitus without complication, with long-term current use of insulin ([place])    Rx / DC Orders ED Discharge Orders          Ordered    Insulin Glargine [facility] KWIKPEN) 100 UNIT/ML  Daily at bedtime        06/13/22 0909    insulin aspart (NOVOLOG) 100 UNIT/ML injection  2 times daily        06/13/22 0909              Jeanell Sparrow, DO 06/13/22 2322

## 2022-06-13 NOTE — Discharge Planning (Signed)
  [place] Medication Assistance Card Name: Dakota Kim ID (MRN): [id number] [place]: [id number] RX Group: [id number] Discharge Date: 06/13/2022 Expiration Date:06/20/2022                                           (must be filled within 7 days of discharge)     You have been approved to have the prescriptions written by your discharging physician filled through our [facility] (Medication Assistance Through [facility]) program. This program allows for a one-time (no refills) 34-day supply of selected medications for a low copay amount.  The copay is $0 per prescription.   Only certain pharmacies are participating in this program with [facility]. You will need to select one of the pharmacies from the attached list and take your prescriptions, this letter, and your photo ID to one of the [place] pharmacies, [facility], [facility] [address], or [facility] [address].   We are excited that you are able to use the [facility] program to get your medications. These prescriptions must be filled within 7 days of hospital discharge or they will no longer be valid for the [facility] program. Should you have any problems with your prescriptions please contact your case management team member at [phone] for [name], [name]

## 2022-06-13 NOTE — Progress Notes (Signed)
This CSW contacted [facility], to assist with medications for this pt. TOC following.

## 2022-06-13 NOTE — ED Provider Notes (Signed)
  Physical Exam  BP (!) 126/93   Pulse 87   Temp 99 F (37.2 C) (Oral)   Resp 16   Ht 5\' 6"  (1.676 m)   Wt 86.2 kg   SpO2 100%   BMI 30.67 kg/m     Procedures  Procedures  ED Course / MDM    Medical Decision Making Amount and/or Complexity of Data Reviewed Labs: ordered. Radiology: ordered. ECG/medicine tests: ordered.  Risk Prescription drug management.   32M presenting with hyperglycemia off outpatient insulin, CBG >600. CMP pending. Getting fluids now, VBG with pH 7.35.   The patient was administered an IV fluid bolus in addition to 5 units of insulin.  His sugar was rechecked and was downtrending but remained persistently elevated greater than 300.  On review, patient's home insulin regiment appears to be 30 units twice daily at lunch and dinner with meals.  Additional insulin administered in addition to an IV fluid bolus with subsequent improvement in patient's blood glucose with downtrending to 209.  He is not in DKA.  He is tolerating oral intake and overall well-appearing.  Remainder of laboratory work-up reassuring.  Patient had lack of access to insulin outpatient.  Consult to social work and diabetes coordinator was placed to assist the patient with obtaining his medications and to obtain care outpatient.  He will follow-up for an appointment on 10/17 with the internal medicine clinic at [facility] to establish care with a PCP for further diabetes management.  In the meantime, he has home insulin was reordered and sent to his pharmacy on file.  Return precautions provided in the event of worsening signs and symptoms of DKA or other abnormality.  Social work provided assistance for prescriptions. Stable for discharge.      Regan Lemming, MD 06/14/22 [phone]

## 2022-06-13 NOTE — Discharge Planning (Signed)
RNCM consulted regarding PCP establishment and insurance enrollment. Pt presented to [facility] ED today with Hypergylcemia. RNCM obtained appointment on 06/27/2022 (Dr Nikki Dom at [facility]), @0845   and placed on After Visit Summary paperwork.  No further case management needs communicated at this time. Dakota Kim J. Clydene Laming, [place], [place], [place]

## 2022-06-13 NOTE — Discharge Instructions (Signed)
Please follow-up with Dr. Nikki Dom for further outpatient management of your diabetes.

## 2022-06-13 NOTE — Discharge Planning (Signed)
RNCM consulted for medication assistance.  RNCM enrolled patient in [facility] (Medication Assistance Through [facility]) PROCARE information entered; Rx [facility] to fill and deliver to pt at bedside prior to discharge home today. RNCM updated EDP and ED RN.

## 2022-06-13 NOTE — ED Notes (Signed)
Diabetes coordinator at bedside speaking with patient about diabetic home management.

## 2022-06-16 ENCOUNTER — Other Ambulatory Visit (HOSPITAL_COMMUNITY): Payer: Self-pay

## 2022-06-22 ENCOUNTER — Ambulatory Visit: Payer: Self-pay | Admitting: Nurse Practitioner

## 2022-06-27 ENCOUNTER — Ambulatory Visit: Payer: Self-pay | Admitting: Student

## 2022-06-27 NOTE — Progress Notes (Deleted)
06/30/2022 Dakota Kim long ED Hyperglycemia not DKA  CARE GAPS***

## 2022-07-12 ENCOUNTER — Ambulatory Visit: Payer: Self-pay | Admitting: Student

## 2022-07-13 ENCOUNTER — Telehealth: Payer: Self-pay | Admitting: Nurse Practitioner

## 2022-07-13 NOTE — Telephone Encounter (Signed)
INUSLIN

## 2022-07-19 ENCOUNTER — Ambulatory Visit: Payer: Self-pay | Admitting: Nurse Practitioner

## 2022-07-26 ENCOUNTER — Ambulatory Visit: Payer: Self-pay | Admitting: Nurse Practitioner

## 2022-08-11 ENCOUNTER — Other Ambulatory Visit: Payer: Self-pay

## 2022-08-11 ENCOUNTER — Emergency Department (HOSPITAL_COMMUNITY)
Admission: EM | Admit: 2022-08-11 | Discharge: 2022-08-12 | Disposition: A | Discharge status: Home / Self Care | Payer: Medicaid Other | Attending: Emergency Medicine | Admitting: Emergency Medicine

## 2022-08-11 DIAGNOSIS — Z794 Long term (current) use of insulin: Secondary | ICD-10-CM | POA: Insufficient documentation

## 2022-08-11 DIAGNOSIS — Z1152 Encounter for screening for COVID-19: Secondary | ICD-10-CM | POA: Insufficient documentation

## 2022-08-11 DIAGNOSIS — Z7984 Long term (current) use of oral hypoglycemic drugs: Secondary | ICD-10-CM | POA: Insufficient documentation

## 2022-08-11 DIAGNOSIS — J101 Influenza due to other identified influenza virus with other respiratory manifestations: Secondary | ICD-10-CM | POA: Insufficient documentation

## 2022-08-11 DIAGNOSIS — R1084 Generalized abdominal pain: Secondary | ICD-10-CM | POA: Diagnosis not present

## 2022-08-11 DIAGNOSIS — E1165 Type 2 diabetes mellitus with hyperglycemia: Secondary | ICD-10-CM | POA: Diagnosis not present

## 2022-08-11 DIAGNOSIS — E860 Dehydration: Secondary | ICD-10-CM | POA: Diagnosis not present

## 2022-08-11 DIAGNOSIS — R739 Hyperglycemia, unspecified: Secondary | ICD-10-CM

## 2022-08-11 DIAGNOSIS — R531 Weakness: Secondary | ICD-10-CM | POA: Diagnosis present

## 2022-08-11 LAB — CBG MONITORING, ED: Glucose-Capillary: 403 mg/dL — ABNORMAL HIGH (ref 70–99)

## 2022-08-11 MED ORDER — SODIUM CHLORIDE 0.9 % IV BOLUS (SEPSIS)
1000.0000 mL | Freq: Once | INTRAVENOUS | Status: AC
  Administered 2022-08-12: 1000 mL via INTRAVENOUS

## 2022-08-11 NOTE — ED Triage Notes (Signed)
[place] [facility] [place] Pt c/o dizzy/weakness/Painful urination/thirst/abd pain. Pt states he has been out of medication/insulin for 2 weeks. CBG 276

## 2022-08-11 NOTE — ED Provider Notes (Signed)
[facility] EMERGENCY DEPARTMENT Provider Note   CSN: [id number] Arrival date & time: 08/11/22  2341     History {Add pertinent medical, surgical, social history, OB history to HPI:1} Chief Complaint  Patient presents with   Weakness    Dakota Kim is a 51 y.o. male.  The history is provided by the patient, the EMS personnel and medical records.  Weakness Dakota Kim is a 52 y.o. male who presents to the Emergency Department complaining of weakness.  He presents to the emergency department by EMS for evaluation of feeling poorly.  EMS was called out when a downtown concert ended and patient felt unwell and was unable to leave.  He has been out of his insulin for 2 weeks.  Patient reports feeling unwell for 2 days with abdominal pain and dysuria.  No reports of fever at home.  No nausea, vomiting, diarrhea.  He does have some cough.  No known sick contacts.     Home Medications Prior to Admission medications   Medication Sig Start Date End Date Taking? Authorizing Provider  atorvastatin (LIPITOR) 10 MG tablet TAKE 1 TABLET (10 MG TOTAL) BY MOUTH DAILY. Patient not taking: Reported on 06/13/2022 11/17/20 11/17/21  Kallie Locks, FNP  glimepiride (AMARYL) 4 MG tablet Take 4 mg by mouth daily with breakfast. Patient not taking: Reported on 06/13/2022    [provider]  glipiZIDE (GLUCOTROL) 10 MG tablet Take 10 mg by mouth daily before breakfast. Patient not taking: Reported on 06/13/2022    [provider]  Insulin Syringe-Needle U-100 30G X 5/16" 0.3 ML MISC Inject 30 Units into the skin 2 (two) times daily. 04/20/22   Lonell Grandchild, MD  insulin aspart (NOVOLOG) 100 UNIT/ML injection Inject 30 Units into the skin 2 (two) times daily. With breakfast and lunch Patient not taking: Reported on 06/13/2022 06/13/22   Ernie Avena, MD  Insulin Glargine [facility]) 100 UNIT/ML Inject 20 Units into the skin at bedtime. Patient not taking:  Reported on 06/13/2022 06/13/22   Ernie Avena, MD  insulin lispro (HUMALOG KWIKPEN) 100 UNIT/ML KwikPen Inject into the skin. Patient not taking: Reported on 06/13/2022    [provider]  Insulin Pen Needle (PEN NEEDLES) 31G X 6 MM MISC Use as directed 3 (three) times daily. 04/20/22   Lonell Grandchild, MD  sildenafil (VIAGRA) 100 MG tablet Take 1 tablet (100 mg total) by mouth daily as needed for erectile dysfunction. Patient not taking: Reported on 06/13/2022 03/24/21   Barbette Merino, NP  ferrous sulfate 325 (65 FE) MG tablet Take 1 tablet (325 mg total) by mouth daily. Patient not taking: Reported on 09/23/2019 08/28/18 07/28/20  Kallie Locks, FNP  Insulin Lispro Prot & Lispro (HUMALOG MIX 75/25 KWIKPEN) (75-25) 100 UNIT/ML Kwikpen Inject 45 Units into the skin 2 (two) times daily with breakfast and lunch. 10/13/20 10/13/20  Gailen Shelter, PA      Allergies    Patient has no known allergies.    Review of Systems   Review of Systems  Neurological:  Positive for weakness.  All other systems reviewed and are negative.   Physical Exam Updated Vital Signs BP (!) 136/97 (BP Location: Right Arm)   Pulse (!) 104   Temp (!) 100.5 F (38.1 C) (Oral)   Resp 15   SpO2 97%  Physical Exam Vitals and nursing note reviewed.  Constitutional:      Appearance: He is well-developed.  HENT:  Head: Normocephalic and atraumatic.     Comments: Conjunctival injection bilaterally, dry mucous membranes Cardiovascular:     Rate and Rhythm: Regular rhythm. Tachycardia present.     Heart sounds: No murmur heard. Pulmonary:     Effort: Pulmonary effort is normal. No respiratory distress.     Breath sounds: Normal breath sounds.  Abdominal:     Palpations: Abdomen is soft.     Tenderness: There is abdominal tenderness. There is no guarding or rebound.     Comments: Moderate generalized abdominal tenderness with voluntary guarding  Musculoskeletal:        General: No swelling or  tenderness.  Skin:    General: Skin is warm and dry.  Neurological:     Mental Status: He is alert and oriented to person, place, and time.  Psychiatric:        Behavior: Behavior normal.     ED Results / Procedures / Treatments   Labs (all labs ordered are listed, but only abnormal results are displayed) Labs Reviewed  CBG MONITORING, ED - Abnormal; Notable for the following components:      Result Value   Glucose-Capillary 403 (*)    All other components within normal limits    EKG None  Radiology No results found.  Procedures Procedures  {Document cardiac monitor, telemetry assessment procedure when appropriate:1}  Medications Ordered in ED Medications  sodium chloride 0.9 % bolus 1,000 mL (has no administration in time range)    ED Course/ Medical Decision Making/ A&P                           Medical Decision Making Amount and/or Complexity of Data Reviewed Labs: ordered. Radiology: ordered. ECG/medicine tests: ordered.   ***  {Document critical care time when appropriate:1} {Document review of labs and clinical decision tools ie heart score, Chads2Vasc2 etc:1}  {Document your independent review of radiology images, and any outside records:1} {Document your discussion with family members, caretakers, and with consultants:1} {Document social determinants of health affecting pt's care:1} {Document your decision making why or why not admission, treatments were needed:1} Final Clinical Impression(s) / ED Diagnoses Final diagnoses:  None    Rx / DC Orders ED Discharge Orders     None

## 2022-08-12 ENCOUNTER — Emergency Department (HOSPITAL_COMMUNITY): Payer: Medicaid Other

## 2022-08-12 LAB — I-STAT CHEM 8, ED
BUN: 23 mg/dL — ABNORMAL HIGH (ref 6–20)
Calcium, Ion: 1.25 mmol/L (ref 1.15–1.40)
Chloride: 103 mmol/L (ref 98–111)
Creatinine, Ser: 0.8 mg/dL (ref 0.61–1.24)
Glucose, Bld: 436 mg/dL — ABNORMAL HIGH (ref 70–99)
HCT: 51 % (ref 39.0–52.0)
Hemoglobin: 17.3 g/dL — ABNORMAL HIGH (ref 13.0–17.0)
Potassium: 4.4 mmol/L (ref 3.5–5.1)
Sodium: 141 mmol/L (ref 135–145)
TCO2: 24 mmol/L (ref 22–32)

## 2022-08-12 LAB — CBG MONITORING, ED
Glucose-Capillary: 436 mg/dL — ABNORMAL HIGH (ref 70–99)
Glucose-Capillary: 279 mg/dL — ABNORMAL HIGH (ref 70–99)

## 2022-08-12 LAB — COMPREHENSIVE METABOLIC PANEL
ALT: 17 U/L (ref 0–44)
AST: 15 U/L (ref 15–41)
Albumin: 3.5 g/dL (ref 3.5–5.0)
Alkaline Phosphatase: 63 U/L (ref 38–126)
Anion gap: 12 (ref 5–15)
BUN: 18 mg/dL (ref 6–20)
CO2: 25 mmol/L (ref 22–32)
Calcium: 9.6 mg/dL (ref 8.9–10.3)
Chloride: 106 mmol/L (ref 98–111)
Creatinine, Ser: 0.98 mg/dL (ref 0.61–1.24)
GFR, Estimated: >60 mL/min (ref >60)
Glucose, Bld: 398 mg/dL — ABNORMAL HIGH (ref 70–99)
Potassium: 4.4 mmol/L (ref 3.5–5.1)
Sodium: 143 mmol/L (ref 135–145)
Total Bilirubin: 0.8 mg/dL (ref 0.3–1.2)
Total Protein: 6.6 g/dL (ref 6.5–8.1)

## 2022-08-12 LAB — I-STAT VENOUS BLOOD GAS, ED
Acid-Base Excess: 2 mmol/L (ref 0.0–2.0)
Bicarbonate: 26 mmol/L (ref 20.0–28.0)
Calcium, Ion: 1.22 mmol/L (ref 1.15–1.40)
HCT: 50 % (ref 39.0–52.0)
Hemoglobin: 17 g/dL (ref 13.0–17.0)
O2 Saturation: 86 %
Potassium: 4.4 mmol/L (ref 3.5–5.1)
Sodium: 140 mmol/L (ref 135–145)
TCO2: 27 mmol/L (ref 22–32)
pCO2, Ven: 38.9 mmHg — ABNORMAL LOW (ref 44–60)
pH, Ven: 7.434 — ABNORMAL HIGH (ref 7.25–7.43)
pO2, Ven: 50 mmHg — ABNORMAL HIGH (ref 32–45)

## 2022-08-12 LAB — RESP PANEL BY RT-PCR (FLU A&B, COVID) ARPGX2
Influenza A by PCR: POSITIVE — AB
Influenza B by PCR: NEGATIVE
SARS Coronavirus 2 by RT PCR: NEGATIVE

## 2022-08-12 LAB — CBC WITH DIFFERENTIAL/PLATELET
Abs Immature Granulocytes: 0.05 10*3/uL (ref 0.00–0.07)
Basophils Absolute: 0 10*3/uL (ref 0.0–0.1)
Basophils Relative: 0 %
Eosinophils Absolute: 0 10*3/uL (ref 0.0–0.5)
Eosinophils Relative: 0 %
HCT: 47.7 % (ref 39.0–52.0)
Hemoglobin: 16.2 g/dL (ref 13.0–17.0)
Immature Granulocytes: 1 %
Lymphocytes Relative: 7 %
Lymphs Abs: 0.7 10*3/uL (ref 0.7–4.0)
MCH: 30.7 pg (ref 26.0–34.0)
MCHC: 34 g/dL (ref 30.0–36.0)
MCV: 90.5 fL (ref 80.0–100.0)
Monocytes Absolute: 1 10*3/uL (ref 0.1–1.0)
Monocytes Relative: 11 %
Neutro Abs: 7.7 10*3/uL (ref 1.7–7.7)
Neutrophils Relative %: 81 %
Platelets: 221 10*3/uL (ref 150–400)
RBC: 5.27 MIL/uL (ref 4.22–5.81)
RDW: 13.2 % (ref 11.5–15.5)
WBC: 9.5 10*3/uL (ref 4.0–10.5)
nRBC: 0 % (ref 0.0–0.2)

## 2022-08-12 LAB — URINALYSIS, ROUTINE W REFLEX MICROSCOPIC
Bacteria, UA: NONE SEEN
Bilirubin Urine: NEGATIVE
Glucose, UA: >=500 mg/dL — AB
Hgb urine dipstick: NEGATIVE
Ketones, ur: 20 mg/dL — AB
Leukocytes,Ua: NEGATIVE
Nitrite: NEGATIVE
Protein, ur: NEGATIVE mg/dL
Specific Gravity, Urine: 1.041 — ABNORMAL HIGH (ref 1.005–1.030)
pH: 5 (ref 5.0–8.0)

## 2022-08-12 LAB — PROTIME-INR
INR: 1 (ref 0.8–1.2)
Prothrombin Time: 13.2 seconds (ref 11.4–15.2)

## 2022-08-12 LAB — LACTIC ACID, PLASMA: Lactic Acid, Venous: 1.3 mmol/L (ref 0.5–1.9)

## 2022-08-12 LAB — APTT: aPTT: 27 seconds (ref 24–36)

## 2022-08-12 LAB — LIPASE, BLOOD: Lipase: 31 U/L (ref 11–51)

## 2022-08-12 MED ORDER — SODIUM CHLORIDE 0.9 % IV BOLUS
1000.0000 mL | Freq: Once | INTRAVENOUS | Status: AC
  Administered 2022-08-12: 1000 mL via INTRAVENOUS

## 2022-08-12 MED ORDER — INSULIN ASPART 100 UNIT/ML IJ SOLN: 30.0000 [IU] | Freq: Two times a day (BID) | INTRAMUSCULAR | 11 refills | Status: DC

## 2022-08-12 MED ORDER — IOHEXOL 350 MG/ML SOLN
75.0000 mL | Freq: Once | INTRAVENOUS | Status: AC | PRN
  Administered 2022-08-12: 75 mL via INTRAVENOUS

## 2022-08-12 MED ORDER — ATORVASTATIN CALCIUM 10 MG PO TABS: 10.0000 mg | ORAL_TABLET | Freq: Every day | ORAL | 11 refills | Status: DC

## 2022-08-12 MED ORDER — ACETAMINOPHEN 325 MG PO TABS
650.0000 mg | ORAL_TABLET | Freq: Once | ORAL | Status: AC
  Administered 2022-08-12: 650 mg via ORAL
  Filled 2022-08-12: qty 2

## 2022-08-12 MED ORDER — GLIMEPIRIDE 4 MG PO TABS: 4.0000 mg | ORAL_TABLET | Freq: Every day | ORAL | 1 refills | Status: DC

## 2022-08-12 MED ORDER — INSULIN ASPART 100 UNIT/ML IJ SOLN
10.0000 [IU] | Freq: Once | INTRAMUSCULAR | Status: AC
  Administered 2022-08-12: 10 [IU] via SUBCUTANEOUS

## 2022-08-12 MED ORDER — BASAGLAR KWIKPEN 100 UNIT/ML ~~LOC~~ SOPN: 20.0000 [IU] | PEN_INJECTOR | Freq: Every day | SUBCUTANEOUS | 0 refills | Status: DC

## 2022-08-12 MED ORDER — GLIPIZIDE 10 MG PO TABS: 10.0000 mg | ORAL_TABLET | Freq: Every day | ORAL | 1 refills | Status: DC

## 2022-08-12 NOTE — Care Management (Signed)
Patient has been out of medication for 2 weeks. Has had a previous MATCH on 10/3 Reinstated MATCH, patient needs to call and go to [facility] [employer] [facility] to establish care with them.    MATCH Medication Assistance Card Name: Dakota Kim YT:[id number] [facility]: [id number] RX Group: [id number] Discharge Date:  08/12/2022 Expiration Date:  08/19/2022 (must be filled within 7 days of discharge)

## 2022-08-12 NOTE — ED Notes (Signed)
Pt eating crackers and drank water.

## 2022-08-12 NOTE — Discharge Instructions (Addendum)
MATCH Medication Assistance Card Name: Karlton Maya BM:[id number] [facility]: [id number] RX Group: [id number] Discharge Date:  08/12/2022 Expiration Date:  08/19/2022 Dear ______Jemotte  Bonita Quin have been approved to have the prescriptions written by your discharging physician filled through our [facility] (Medication Assistance Through [facility]) program. This program allows for a one-time (no refills) 34-day supply of selected medications for a low copay amount.  The copay is $3.00 per prescription. For instance, if you have one prescription, you will pay $3.00; for two prescriptions, you pay $6.00; for three prescriptions, you pay $9.00; and so on.  Only certain pharmacies are participating in this program with [facility]. You will need to select one of the pharmacies from the attached list and take your prescriptions, this letter, and your photo ID to one of the participating pharmacies.   We are excited that you are able to use the [facility] program to get your medications. These prescriptions must be filled within 7 days of hospital discharge or they will no longer be valid for the [facility] program. Should you have any problems with your prescriptions please contact your case management team member at [phone] for [facility].  Thank you, Gilda Crease DNP, MSN, RN [phone]   [facility]    [place] - Locations  [facility]        [address] [place], [place]  [facility]    [address] [place], [place], [place]   [facility]        [address], [employer], [place]  [facility]       [address] [place], [place], [place]  Other [facility] [address] [place], [place]  [facility]                                                                   [address], [employer], [place]  [facility]          [address], [place], [place]  [facility] [address], [place], [place]   [address] [place], [place], [place]  [address], [place], [place]  [address], [place], [place]   [address] [place], [place]   [address] [place]   [address], [place], [place] [address], [place], [place] [address], [place], [place]    [facility] [address], [place], [place]  [address], [place], [place] [address], [place], [place]  [address] [place], [place] [address] [place], [place] [address] [place] [address] [place], [place]  [address], [place], [place] [address] [place], [place], [place] [address], [place], [place] [address], [place], [place]   [address], [place], [place] [address], [place], [place] [address], [place], [place] [address], [place], [place]   [address] [place], [place]   [address] [place], [place] [address] [place], [place], [place]  [address], [place], [place]  [facility] [address] [place], [place], [place] [address], [place], [place] [address] [place], [place] [address] [place], [place] [address] [place], [place], [place]  [address] [place], [place], [place] [address], [place], [place] [address] [place] [address] [place], [place], [place] [address], [place], [place] [address], [place], [place] [address] [place], [place], [place] [address], [place], [place] (effective 03/11/17) [address], [place], [place] (effective 03/11/17)   (must be filled within 7 days of discharge)

## 2022-08-12 NOTE — ED Provider Notes (Signed)
  Physical Exam  BP (!) 143/97   Pulse 98   Temp 98.3 F (36.8 C) (Oral)   Resp 17   Ht 5\' 6"  (1.676 m)   Wt 87 kg   SpO2 97%   BMI 30.96 kg/m   Physical Exam  Procedures  Procedures  ED Course / MDM    Medical Decision Making Amount and/or Complexity of Data Reviewed Labs: ordered. Radiology: ordered. ECG/medicine tests: ordered.  Risk OTC drugs. Prescription drug management.   Received patient signed.  Waiting on transition of care for match program.  Match letter has been given.  Prescriptions written.  Appears stable for discharge home.       , MD 08/12/22 1229

## 2022-08-13 LAB — URINE CULTURE: Culture: 2000 — AB

## 2022-08-14 ENCOUNTER — Telehealth (HOSPITAL_BASED_OUTPATIENT_CLINIC_OR_DEPARTMENT_OTHER): Payer: Self-pay | Admitting: *Deleted

## 2022-08-14 NOTE — Telephone Encounter (Signed)
Post ED Visit - Positive Culture Follow-up  Culture report reviewed by antimicrobial stewardship pharmacist: [facility] []  , Pharm.D. []  Enzo Bi, .D., BCPS AQ-ID []  Celedonio Miyamoto, Pharm.D., BCPS []  [address], Pharm.D., BCPS []  Dewey, Garvin Fila.D., BCPS, AAHIVP []  , Pharm.D., BCPS, AAHIVP []  Georgina Pillion, PharmD, BCPS []  , PharmD, BCPS []  [place], PharmD, BCPS []  [address], PharmD []  , PharmD, BCPS [x]  Estella Husk, PharmD  Pharmacy Team []  Lysle Pearl, PharmD []  , PharmD []  Phillips Climes, PharmD []  , Rph []  Agapito Games) , PharmD []  Verlan Friends, PharmD []  , PharmD []  Mervyn Gay, PharmD []  , PharmD []  Gerrit Halls, PharmD []  Wonda Olds, PharmD []  , PharmD []  Len Childs, PharmD   Positive urine culture No further patient follow-up is required at this time.  08/14/2022, 2:48 PM

## 2022-08-17 LAB — CULTURE, BLOOD (ROUTINE X 2)
Culture: NO GROWTH
Culture: NO GROWTH
Special Requests: ADEQUATE
Special Requests: ADEQUATE

## 2022-08-21 ENCOUNTER — Telehealth: Payer: Self-pay

## 2022-08-21 NOTE — Telephone Encounter (Signed)
Transition Care Management Unsuccessful Follow-up Telephone Call  Date of discharge and from where:  10/02/21  Attempts:  1st Attempt  Reason for unsuccessful TCM follow-up call:  Unable to leave message.  Christophr Calix RMA     

## 2022-10-10 ENCOUNTER — Encounter (HOSPITAL_COMMUNITY): Payer: Self-pay | Admitting: Emergency Medicine

## 2022-10-10 ENCOUNTER — Emergency Department (HOSPITAL_COMMUNITY)
Admission: EM | Admit: 2022-10-10 | Discharge: 2022-10-10 | Disposition: A | Discharge status: Home / Self Care | Payer: Self-pay | Attending: Emergency Medicine | Admitting: Emergency Medicine

## 2022-10-10 ENCOUNTER — Other Ambulatory Visit (HOSPITAL_COMMUNITY): Payer: Self-pay

## 2022-10-10 ENCOUNTER — Other Ambulatory Visit: Payer: Self-pay

## 2022-10-10 DIAGNOSIS — R739 Hyperglycemia, unspecified: Secondary | ICD-10-CM

## 2022-10-10 DIAGNOSIS — E1165 Type 2 diabetes mellitus with hyperglycemia: Secondary | ICD-10-CM | POA: Insufficient documentation

## 2022-10-10 DIAGNOSIS — Z7984 Long term (current) use of oral hypoglycemic drugs: Secondary | ICD-10-CM | POA: Insufficient documentation

## 2022-10-10 DIAGNOSIS — Z794 Long term (current) use of insulin: Secondary | ICD-10-CM | POA: Insufficient documentation

## 2022-10-10 LAB — CBC
HCT: 44.6 % (ref 39.0–52.0)
Hemoglobin: 14.7 g/dL (ref 13.0–17.0)
MCH: 30.1 pg (ref 26.0–34.0)
MCHC: 33 g/dL (ref 30.0–36.0)
MCV: 91.2 fL (ref 80.0–100.0)
Platelets: 235 10*3/uL (ref 150–400)
RBC: 4.89 MIL/uL (ref 4.22–5.81)
RDW: 13.1 % (ref 11.5–15.5)
WBC: 5.3 10*3/uL (ref 4.0–10.5)
nRBC: 0 % (ref 0.0–0.2)

## 2022-10-10 LAB — URINALYSIS, ROUTINE W REFLEX MICROSCOPIC
Bacteria, UA: NONE SEEN
Bilirubin Urine: NEGATIVE
Glucose, UA: >=500 mg/dL — AB
Hgb urine dipstick: NEGATIVE
Ketones, ur: NEGATIVE mg/dL
Leukocytes,Ua: NEGATIVE
Nitrite: NEGATIVE
Protein, ur: NEGATIVE mg/dL
Specific Gravity, Urine: 1.031 — ABNORMAL HIGH (ref 1.005–1.030)
pH: 5 (ref 5.0–8.0)

## 2022-10-10 LAB — BLOOD GAS, VENOUS
Acid-Base Excess: 0.3 mmol/L (ref 0.0–2.0)
Bicarbonate: 25.4 mmol/L (ref 20.0–28.0)
O2 Saturation: 92.1 %
Patient temperature: 37
pCO2, Ven: 42 mmHg — ABNORMAL LOW (ref 44–60)
pH, Ven: 7.39 (ref 7.25–7.43)
pO2, Ven: 59 mmHg — ABNORMAL HIGH (ref 32–45)

## 2022-10-10 LAB — CBG MONITORING, ED
Glucose-Capillary: >600 mg/dL (ref 70–99)
Glucose-Capillary: 480 mg/dL — ABNORMAL HIGH (ref 70–99)

## 2022-10-10 LAB — COMPREHENSIVE METABOLIC PANEL
ALT: 11 U/L (ref 0–44)
AST: 16 U/L (ref 15–41)
Albumin: 3.6 g/dL (ref 3.5–5.0)
Alkaline Phosphatase: 61 U/L (ref 38–126)
Anion gap: 10 (ref 5–15)
BUN: 16 mg/dL (ref 6–20)
CO2: 24 mmol/L (ref 22–32)
Calcium: 8.4 mg/dL — ABNORMAL LOW (ref 8.9–10.3)
Chloride: 95 mmol/L — ABNORMAL LOW (ref 98–111)
Creatinine, Ser: 0.93 mg/dL (ref 0.61–1.24)
GFR, Estimated: >60 mL/min (ref >60)
Glucose, Bld: 683 mg/dL (ref 70–99)
Potassium: 4.4 mmol/L (ref 3.5–5.1)
Sodium: 129 mmol/L — ABNORMAL LOW (ref 135–145)
Total Bilirubin: 0.7 mg/dL (ref 0.3–1.2)
Total Protein: 6.9 g/dL (ref 6.5–8.1)

## 2022-10-10 LAB — BETA-HYDROXYBUTYRIC ACID: Beta-Hydroxybutyric Acid: 0.41 mmol/L — ABNORMAL HIGH (ref 0.05–0.27)

## 2022-10-10 LAB — OSMOLALITY: Osmolality: 315 mOsm/kg — ABNORMAL HIGH (ref 275–295)

## 2022-10-10 MED ORDER — BASAGLAR KWIKPEN 100 UNIT/ML ~~LOC~~ SOPN
20.0000 [IU] | PEN_INJECTOR | Freq: Every day | SUBCUTANEOUS | 0 refills | Status: DC
  Filled 2022-10-10: qty 6, 30d supply, fill #0

## 2022-10-10 MED ORDER — INSULIN GLARGINE-YFGN 100 UNIT/ML ~~LOC~~ SOLN
20.0000 [IU] | Freq: Once | SUBCUTANEOUS | Status: AC
  Administered 2022-10-10: 20 [IU] via SUBCUTANEOUS
  Filled 2022-10-10: qty 0.2

## 2022-10-10 MED ORDER — LACTATED RINGERS IV BOLUS
1000.0000 mL | Freq: Once | INTRAVENOUS | Status: AC
  Administered 2022-10-10: 1000 mL via INTRAVENOUS

## 2022-10-10 MED ORDER — INSULIN LISPRO (1 UNIT DIAL) 100 UNIT/ML (KWIKPEN)
20.0000 [IU] | PEN_INJECTOR | Freq: Two times a day (BID) | SUBCUTANEOUS | 0 refills | Status: DC
  Filled 2022-10-10: qty 9, 23d supply, fill #0

## 2022-10-10 NOTE — Discharge Instructions (Addendum)
[facility] Medication Assistance Card Name: Marlee Trentman ID (MRN): [id number] [place]: [id number]                                          RX Group: [id number] Discharge Date: 10/10/2022 Expiration Date:10/18/2022                                                                                                    (must be filled within 7 days of discharge)         You have been approved to have the prescriptions written by your discharging physician filled through our [facility] (Medication Assistance Through [facility]) program. This program allows for a one-time (no refills) 34-day supply of selected medications for a low copay amount.   The copay is $0.00 per prescription. For instance, if you have one prescription, you will pay $0.00; for two prescriptions, you pay $0.00; for three prescriptions, you pay $0.00; and so on.   Only certain pharmacies are participating in this program with [facility]. You will need to select one of the pharmacies from the attached list and take your prescriptions, this letter, and your photo ID to one of the [facility] pharmac[facility], [facility], [facility] at [address], or [facility] [address].    We are excited that you are able to use the [facility] program to get your medications. These prescriptions must be filled within 7 days of hospital discharge or they will no longer be valid for the [facility] program. Should you have any problems with your prescriptions please contact your case management team member at [phone] for [facility] Mussel [place]/ [place] you, [place] Management          I have sent your insulin prescriptions to the [facility].  You can pick them up tomorrow.  Please make sure to follow-up with your new doctor

## 2022-10-10 NOTE — ED Triage Notes (Signed)
Patient complains of increased thirst and urinary frequency. He is homeless and has not had access to insulin since 02/17/2022.    Hx: Diabetes  EMS vitals: 138/98 BP 451 CBG 90 HR 97% SPO2 on room air

## 2022-10-10 NOTE — ED Provider Notes (Signed)
[facility] Provider Note   CSN: [id number] Arrival date & time: 10/10/22  1101     History  Chief Complaint  Patient presents with   Hyperglycemia    Dakota Kim is a 52 y.o. male with PMH T2DM and prior presentations for DKA who is coming in for hyperglycemia after assessment in community clinic.  He states that he was recently incarcerated and placed on January 9 and has since then not been able to get any insulin.  He is currently unhomed.  Over the past few days he has had worse knowing polydipsia and polyuria with associated fatigue.  He has been drinking plenty of p.o. fluids to try and stay hydrated.  He has not had any nausea or vomiting or abdominal pain.  He denies any altered mental status or confusion, respiratory stress, dyspnea, chest pain.      Home Medications Prior to Admission medications   Medication Sig Start Date End Date Taking? Authorizing Provider  atorvastatin (LIPITOR) 10 MG tablet TAKE 1 TABLET (10 MG TOTAL) BY MOUTH DAILY. 08/12/22 08/12/23  Davonna Belling, MD  glimepiride (AMARYL) 4 MG tablet Take 1 tablet (4 mg total) by mouth daily with breakfast. 08/12/22   Davonna Belling, MD  glipiZIDE (GLUCOTROL) 10 MG tablet Take 1 tablet (10 mg total) by mouth daily before breakfast. 08/12/22   Davonna Belling, MD  Insulin Syringe-Needle U-100 30G X 5/16" 0.3 ML MISC Inject 30 Units into the skin 2 (two) times daily. 04/20/22   Cristie Hem, MD  insulin aspart (NOVOLOG) 100 UNIT/ML injection Inject 30 Units into the skin 2 (two) times daily. With breakfast and lunch 08/12/22   Davonna Belling, MD  Insulin Glargine [facility]) 100 UNIT/ML Inject 20 Units into the skin at bedtime. 08/12/22   Davonna Belling, MD  insulin lispro (HUMALOG KWIKPEN) 100 UNIT/ML KwikPen Inject into the skin. Patient not taking: Reported on 06/13/2022    [provider]  Insulin Pen Needle (PEN NEEDLES) 31G X 6 MM  MISC Use as directed 3 (three) times daily. 04/20/22   Cristie Hem, MD  sildenafil (VIAGRA) 100 MG tablet Take 1 tablet (100 mg total) by mouth daily as needed for erectile dysfunction. Patient not taking: Reported on 06/13/2022 03/24/21   Vevelyn Francois, NP  ferrous sulfate 325 (65 FE) MG tablet Take 1 tablet (325 mg total) by mouth daily. Patient not taking: Reported on 09/23/2019 08/28/18 07/28/20  Azzie Glatter, FNP  Insulin Lispro Prot & Lispro (HUMALOG MIX 75/25 KWIKPEN) (75-25) 100 UNIT/ML Kwikpen Inject 45 Units into the skin 2 (two) times daily with breakfast and lunch. 10/13/20 10/13/20  Tedd Sias, PA      Allergies    Patient has no known allergies.    Review of Systems   See HPI  Physical Exam Updated Vital Signs BP (!) 127/90 (BP Location: Left Arm)   Pulse 92   Temp 97.9 F (36.6 C) (Oral)   Resp 16   SpO2 100%  Physical Exam Constitutional:      General: He is not in acute distress.    Appearance: Normal appearance. He is not ill-appearing.  Cardiovascular:     Rate and Rhythm: Normal rate and regular rhythm.     Pulses: Normal pulses.  Pulmonary:     Effort: Pulmonary effort is normal. No respiratory distress.     Breath sounds: Normal breath sounds.  Abdominal:     General: There is no  distension.     Palpations: Abdomen is soft.     Tenderness: There is no abdominal tenderness.  Musculoskeletal:        General: No swelling.     Right lower leg: No edema.     Left lower leg: No edema.  Skin:    General: Skin is warm and dry.  Neurological:     General: No focal deficit present.     Mental Status: He is alert and oriented to person, place, and time.     Sensory: No sensory deficit.     Motor: No weakness.  Psychiatric:        Mood and Affect: Mood normal.     ED Results / Procedures / Treatments   Labs (all labs ordered are listed, but only abnormal results are displayed) Labs Reviewed  URINALYSIS, ROUTINE W REFLEX MICROSCOPIC -  Abnormal; Notable for the following components:      Result Value   Color, Urine STRAW (*)    Specific Gravity, Urine 1.031 (*)    Glucose, UA >=500 (*)    All other components within normal limits  CBG MONITORING, ED - Abnormal; Notable for the following components:   Glucose-Capillary >600 (*)    All other components within normal limits  COMPREHENSIVE METABOLIC PANEL  BETA-HYDROXYBUTYRIC ACID  BLOOD GAS, VENOUS  OSMOLALITY  CBC    EKG None  Radiology No results found.  Medications Ordered in ED Medications  lactated ringers bolus 1,000 mL (has no administration in time range)    ED Course/ Medical Decision Making/ A&P                             Medical Decision Making Amount and/or Complexity of Data Reviewed Labs: ordered.  Risk Prescription drug management.   52 year old male with past medical history of poorly controlled type 2 diabetes and prior presentations for DKA presenting for polyuria polydipsia and fatigue after several weeks of not being able to take insulin.  Initial CBG is greater than upper limit.  Workup was negative for DKA.  He was treated conservatively with fluid resuscitation and one-time dose of long-acting insulin.  He will need TOC consult for assistance with medication access.  Plan was to send him with short supply of insulin which should be free and then set up for PCP follow-up within a week.  He is stable for discharge at this time.  Final Clinical Impression(s) / ED Diagnoses Final diagnoses:  None    Rx / DC Orders ED Discharge Orders     None         Linus Galas, MD 10/10/22 1357    Carmin Muskrat, MD 10/16/22 2344

## 2022-10-10 NOTE — Progress Notes (Addendum)
Transition of Care [facility]) - Emergency Department Mini Assessment   Patient Details  Name: Dakota Kim MRN: [id number] Date of Birth: 1970/12/30  Transition of Care [facility]) CM/SW Contact:    Roseanne Kaufman, RN Phone Number: 10/10/2022, 12:39 PM   Clinical Narrative: Received TOC consult for medication assistance and homelessness. Per chart review this patient has received MATCH x2 in 2023 (06/12/22 & 12/223). This RNCM spoke with patient at bedside who reports he needs assistance with medications, has no money to pay for meds and has nowhere to store his medications due to being homeless. This RNCM explained the [facility] program. This RNCM discussed having a case manager at [facility], patient reports he has not been able to reach anyone. Patient then reports he has a problem with cocaine and wants to go to "a drug rehab program at [facility]".  This RNCM contacted [facility] provider to discuss medication storage. Per [facility] representative the patient needs to walk in to the clinic and will then speak with case manager. The [facility] has the capability to store insulin however the patient will not have access on the weekends due to [facility] is closed. This RNCM will provide SA resources to the patient as he would need to give the facility a call to set up SA rehab services on his own. Patient reports he does not have a phone, this RNCM advised patient to use the telephone in the room.  This RNCM will provide PCP followup information on AVS. Patient has been referred to [facility] to establish PCP care on previous ED visits.  This RNCM provided patient with bus pass and [place] letter. Patient will need to report to [facility] before 3pm. Medications can be sent to [facility] outpatient pharmacy.  TOC will notify EDP and continue to follow.  - 1:43p Patient will discharge with [place], bus passes, SA resources and PCP appointment on 10/17/22 at 1:30pm. Patient will have blood sugar coverage during his ED visit today and will go to  [place] to pick up medications on tomorrow to store at [facility] starting tomorrow.   No additional TOC needs at this time. ED Mini Assessment: What brought you to the Emergency Department? : thirst and frequent urination  Barriers to Discharge: Continued Medical Work up  [employer] interventions: medication     Interventions which prevented an admission or readmission: Transportation Screening, Medication Review, Pensions consultant, SUD counseling    Patient Contact and Communications        ,          Patient states their goals for this hospitalization and ongoing recovery are:: obtain medication CMS Medicare.gov Compare Post Acute Care list provided to:: Patient Choice offered to / list presented to : Patient  Admission diagnosis:  hyperglycemia Patient Active Problem List   Diagnosis Date Noted   Hyperglycemia 09/24/2019   Hemoglobin A1C greater than 9%, indicating poor diabetic control 09/24/2019   Hyperlipidemia 02/26/2019   Class 2 severe obesity due to excess calories with serious comorbidity and body mass index (BMI) of 35.0 to 35.9 in adult [facility]) 02/26/2019   Type 2 diabetes mellitus without complication, with long-term current use of insulin ([place]) 05/13/2017   DKA (diabetic ketoacidoses) 11/06/2012   Hypokalemia 11/06/2012   PCP:  Vevelyn Francois, NP Pharmacy:   [facility] [address], [place] [address] Phone: [phone] Fax: [facility]  [place] [address] Phone: [phone] Fax: [phone]

## 2022-10-10 NOTE — Discharge Planning (Signed)
  [place] Medication Assistance Card Name: Robbi Scurlock ID (MRN): [id number] [place]: [id number] RX Group: [id number] Discharge Date: 10/10/2022 Expiration Date:10/18/2022                                           (must be filled within 7 days of discharge)     You have been approved to have the prescriptions written by your discharging physician filled through our [facility] (Medication Assistance Through [facility]) program. This program allows for a one-time (no refills) 34-day supply of selected medications for a low copay amount.  The copay is $0.00 per prescription. For instance, if you have one prescription, you will pay $0.00; for two prescriptions, you pay $0.00; for three prescriptions, you pay $0.00; and so on.  Only certain pharmacies are participating in this program with [facility]. You will need to select one of the pharmacies from the attached list and take your prescriptions, this letter, and your photo ID to one of the [place] pharmacies, [facility], [facility] [address], or [facility] [address].   We are excited that you are able to use the [facility] program to get your medications. These prescriptions must be filled within 7 days of hospital discharge or they will no longer be valid for the [facility] program. Should you have any problems with your prescriptions please contact your case management team member at [phone] for [place]/ [place] you, [place] Management

## 2022-10-11 ENCOUNTER — Telehealth: Payer: Self-pay

## 2022-10-11 NOTE — Telephone Encounter (Signed)
Transition Care Management Unsuccessful Follow-up Telephone Call  Date of discharge and from where:  10/10/22  Attempts:  1st Attempt  Reason for unsuccessful TCM follow-up call:  Unable to leave message voice mail was full.   Elyse Jarvis RMA

## 2022-10-12 ENCOUNTER — Other Ambulatory Visit (HOSPITAL_COMMUNITY): Payer: Self-pay

## 2022-10-12 ENCOUNTER — Other Ambulatory Visit: Payer: Self-pay

## 2022-10-12 ENCOUNTER — Telehealth: Payer: Self-pay

## 2022-10-12 MED ORDER — TECHLITE PEN NEEDLES 31G X 5 MM MISC
10 refills | Status: DC
  Filled 2022-10-12: qty 100, 30d supply, fill #0

## 2022-10-12 NOTE — Telephone Encounter (Signed)
This RNCM received inbound call from [place] reporting the patient has returned to [facility] ED due to unable to get his medications.  This RNCM spoke with [place] who reports patient has medication insurance coverage with [employer] [phone] eff-09/11/2022-09/11/2023. [facility] filled medication for insulin lispro.   This RNCM spoke with [facility] who reports patient has pharmacy insurance coverage 09/11/2022-09/11/2023 and medical coverage [employer] coverage. [facility] reports covered preferred insulin aspart with approx $35 copay, if patient receives insulin lispro it will require authorization.      This RNCM went to [facility] ED lobby to meet the patient however patient was no longer in the lobby. This RNCM unable to advise patient's medications can be picked up [place].    No additional TOC needs at this time.

## 2022-10-17 ENCOUNTER — Inpatient Hospital Stay: Payer: Self-pay | Admitting: Internal Medicine

## 2022-10-20 ENCOUNTER — Other Ambulatory Visit (HOSPITAL_COMMUNITY): Payer: Self-pay

## 2022-10-24 ENCOUNTER — Other Ambulatory Visit: Payer: Self-pay | Admitting: Critical Care Medicine

## 2022-10-24 ENCOUNTER — Other Ambulatory Visit (HOSPITAL_COMMUNITY): Payer: Self-pay

## 2022-10-24 MED ORDER — ATORVASTATIN CALCIUM 10 MG PO TABS
10.0000 mg | ORAL_TABLET | Freq: Every day | ORAL | 11 refills | Status: DC
  Filled 2022-10-24: qty 30, 30d supply, fill #0

## 2022-10-24 MED ORDER — PEN NEEDLES 31G X 6 MM MISC
1.0000 "pen " | Freq: Three times a day (TID) | 11 refills | Status: DC
  Filled 2022-10-24: qty 100, 30d supply, fill #0

## 2022-10-24 MED ORDER — GLIMEPIRIDE 4 MG PO TABS
4.0000 mg | ORAL_TABLET | Freq: Every day | ORAL | 1 refills | Status: DC
  Filled 2022-10-24: qty 30, 30d supply, fill #0

## 2022-10-24 MED ORDER — BASAGLAR KWIKPEN 100 UNIT/ML ~~LOC~~ SOPN
20.0000 [IU] | PEN_INJECTOR | Freq: Every day | SUBCUTANEOUS | 0 refills | Status: DC
  Filled 2022-10-24: qty 15, 75d supply, fill #0
  Filled 2022-10-24: qty 6, 30d supply, fill #0
  Filled 2022-10-24: qty 15, 75d supply, fill #0

## 2022-10-24 MED ORDER — INSULIN LISPRO (1 UNIT DIAL) 100 UNIT/ML (KWIKPEN)
30.0000 [IU] | PEN_INJECTOR | Freq: Two times a day (BID) | SUBCUTANEOUS | 0 refills | Status: DC
  Filled 2022-10-24 (×2): qty 15, 25d supply, fill #0

## 2022-10-24 NOTE — Congregational Nurse Program (Signed)
  Dept: [phone]   Congregational Nurse Program Note  Date of Encounter: 10/24/2022  Clinic visit for being out of insulin, took last dose of glargine at bedtime last night and had no Novolog for today.  Blood glucose 390 pc breakfast of eggs, grits and coffee with 2 tsps of sugar. Blood pressure 139/89.  Message sent to Dr. Joya Gaskins who ordered glargine and Novolog insulin.  Resident given bus passes to pick up insulin this afternoon after his 1P appointment. He is to see Dr. Joya Gaskins at [facility] clinic on Wednesday.  Past Medical History: Past Medical History:  Diagnosis Date   Diabetes mellitus without complication ([place])    Vitamin D deficiency 09/2019    Encounter Details:  CNP Questionnaire - 10/24/22 0958       Questionnaire   Ask client: Do you give verbal consent for me to treat you today? Yes    Student Assistance N/A    Location Patient [facility]    Visit Setting with Client Organization    Patient Status Unhoused    Insurance Uninsured ([place] Card/Care Connects/Self-Pay/Medicaid Family Planning)    Insurance/Financial Assistance Referral Medicaid    Medication Have Medication Insecurities    Medical Provider No    Screening Referrals Made Annual Wellness Visit    Medical Referrals Made [facility] P[facility]    Medical Appointment Made N/A    Recently w/o PCP, now 1st time PCP visit completed due to CNs referral or appointment made N/A    Food Have Food Insecurities    Transportation Need transportation assistance;Provided transportation assistance    Housing/Utilities No permanent housing    Interpersonal Safety Do not feel safe at current residence    Interventions Advocate/Support;Navigate Healthcare System;Case Management;Counsel;Educate;Spiritual Care;Reviewed Medications    Abnormal to Normal Screening Since Last CN Visit N/A    Screenings CN Performed Blood Pressure;Temperature;Pulse Ox;Weight    Sent Client to Lab for: N/A    Did client  attend any of the following based off CNs referral or appointments made? N/A    ED Visit Averted Yes    Life-Saving Intervention Made N/A

## 2022-10-27 ENCOUNTER — Other Ambulatory Visit (HOSPITAL_COMMUNITY): Payer: Self-pay

## 2022-10-28 ENCOUNTER — Emergency Department (HOSPITAL_COMMUNITY): Payer: Self-pay

## 2022-10-28 ENCOUNTER — Emergency Department (HOSPITAL_COMMUNITY)
Admission: EM | Admit: 2022-10-28 | Discharge: 2022-10-28 | Disposition: A | Discharge status: Home / Self Care | Payer: Self-pay | Attending: Emergency Medicine | Admitting: Emergency Medicine

## 2022-10-28 ENCOUNTER — Other Ambulatory Visit: Payer: Self-pay

## 2022-10-28 ENCOUNTER — Encounter (HOSPITAL_COMMUNITY): Payer: Self-pay

## 2022-10-28 DIAGNOSIS — Z7984 Long term (current) use of oral hypoglycemic drugs: Secondary | ICD-10-CM | POA: Insufficient documentation

## 2022-10-28 DIAGNOSIS — F10239 Alcohol dependence with withdrawal, unspecified: Secondary | ICD-10-CM | POA: Insufficient documentation

## 2022-10-28 DIAGNOSIS — Z794 Long term (current) use of insulin: Secondary | ICD-10-CM | POA: Insufficient documentation

## 2022-10-28 DIAGNOSIS — E119 Type 2 diabetes mellitus without complications: Secondary | ICD-10-CM | POA: Insufficient documentation

## 2022-10-28 DIAGNOSIS — R1084 Generalized abdominal pain: Secondary | ICD-10-CM | POA: Insufficient documentation

## 2022-10-28 DIAGNOSIS — R748 Abnormal levels of other serum enzymes: Secondary | ICD-10-CM | POA: Insufficient documentation

## 2022-10-28 DIAGNOSIS — Z79899 Other long term (current) drug therapy: Secondary | ICD-10-CM | POA: Insufficient documentation

## 2022-10-28 DIAGNOSIS — R197 Diarrhea, unspecified: Secondary | ICD-10-CM | POA: Insufficient documentation

## 2022-10-28 LAB — CBC WITH DIFFERENTIAL/PLATELET
Abs Immature Granulocytes: 0.01 10*3/uL (ref 0.00–0.07)
Basophils Absolute: 0 10*3/uL (ref 0.0–0.1)
Basophils Relative: 0 %
Eosinophils Absolute: 0 10*3/uL (ref 0.0–0.5)
Eosinophils Relative: 1 %
HCT: 44.3 % (ref 39.0–52.0)
Hemoglobin: 14.2 g/dL (ref 13.0–17.0)
Immature Granulocytes: 0 %
Lymphocytes Relative: 19 %
Lymphs Abs: 1.1 10*3/uL (ref 0.7–4.0)
MCH: 30 pg (ref 26.0–34.0)
MCHC: 32.1 g/dL (ref 30.0–36.0)
MCV: 93.5 fL (ref 80.0–100.0)
Monocytes Absolute: 0.5 10*3/uL (ref 0.1–1.0)
Monocytes Relative: 8 %
Neutro Abs: 4.3 10*3/uL (ref 1.7–7.7)
Neutrophils Relative %: 72 %
Platelets: 244 10*3/uL (ref 150–400)
RBC: 4.74 MIL/uL (ref 4.22–5.81)
RDW: 13.9 % (ref 11.5–15.5)
WBC: 5.9 10*3/uL (ref 4.0–10.5)
nRBC: 0 % (ref 0.0–0.2)

## 2022-10-28 LAB — COMPREHENSIVE METABOLIC PANEL
ALT: 17 U/L (ref 0–44)
AST: 22 U/L (ref 15–41)
Albumin: 3.8 g/dL (ref 3.5–5.0)
Alkaline Phosphatase: 65 U/L (ref 38–126)
Anion gap: 13 (ref 5–15)
BUN: 7 mg/dL (ref 6–20)
CO2: 23 mmol/L (ref 22–32)
Calcium: 9 mg/dL (ref 8.9–10.3)
Chloride: 99 mmol/L (ref 98–111)
Creatinine, Ser: 0.69 mg/dL (ref 0.61–1.24)
GFR, Estimated: >60 mL/min (ref >60)
Glucose, Bld: 273 mg/dL — ABNORMAL HIGH (ref 70–99)
Potassium: 4 mmol/L (ref 3.5–5.1)
Sodium: 135 mmol/L (ref 135–145)
Total Bilirubin: 0.3 mg/dL (ref 0.3–1.2)
Total Protein: 7.2 g/dL (ref 6.5–8.1)

## 2022-10-28 LAB — RAPID URINE DRUG SCREEN, HOSP PERFORMED
Amphetamines: NOT DETECTED
Barbiturates: NOT DETECTED
Benzodiazepines: NOT DETECTED
Cocaine: POSITIVE — AB
Opiates: NOT DETECTED
Tetrahydrocannabinol: NOT DETECTED

## 2022-10-28 LAB — ACETAMINOPHEN LEVEL: Acetaminophen (Tylenol), Serum: <10 ug/mL — ABNORMAL LOW (ref 10–30)

## 2022-10-28 LAB — SALICYLATE LEVEL: Salicylate Lvl: <7 mg/dL — ABNORMAL LOW (ref 7.0–30.0)

## 2022-10-28 LAB — ETHANOL: Alcohol, Ethyl (B): <10 mg/dL (ref <10)

## 2022-10-28 LAB — LIPASE, BLOOD: Lipase: 137 U/L — ABNORMAL HIGH (ref 11–51)

## 2022-10-28 MED ORDER — SODIUM CHLORIDE 0.9 % IV BOLUS
1000.0000 mL | Freq: Once | INTRAVENOUS | Status: AC
  Administered 2022-10-28: 1000 mL via INTRAVENOUS

## 2022-10-28 MED ORDER — IOHEXOL 350 MG/ML SOLN
75.0000 mL | Freq: Once | INTRAVENOUS | Status: AC | PRN
  Administered 2022-10-28: 75 mL via INTRAVENOUS

## 2022-10-28 NOTE — ED Notes (Signed)
Patient transported to CT 

## 2022-10-28 NOTE — Care Management (Addendum)
SA resources detox resources added to AVS. Spoke to patient at bedside regarding these resources. The patient stating " this is a waste of time" why did the ambulance take me here if I cannot get help? " The patient did not want to hear anything else, just that " you don't understand, just give me a bus pass and discharge me. " Patient eating applesauce and has bag of food at bedside.

## 2022-10-28 NOTE — ED Notes (Addendum)
This RN entered pt's room to discharge pt. This RN asked pt to lay down so a new set of vitals could be obtained. Pt got out of bed and became verbally aggressive stating, "Give me my papers! I'm ready to go!" Pt then snatched discharge papers and bus pass out of this RN's hands and left the room. Pt refused vital signs prior to discharge. This RN then told pt that the IV needed to be removed from his arm. Pt stated that he took the IV out himself. Pt would not let RN check arm and place bandage over IV site.

## 2022-10-28 NOTE — Discharge Instructions (Addendum)
                  Intensive Outpatient Programs  [facility]    [facility] [place] [address]     [place] [address] [place],  [place], [place]      [place]  (Inpatient and outpatient)  [phone] (Suboxone and Methadone) [address]           [phone]           [facility]: [facility]    [facility] - Intensive Outpatient [address]     [address] [place] [phone] [place], [place] [address]     [place], [place]      [id number]  [facility] [place] (Outpatient, Inpatient, Chemical  [facility] (Groups and [place]) (insurance only) [phone]    [place], [place]       [facility]    [facility] (for caregivers and family) [address]     [address] [place], [place], [place]      [phone]  Residential Treatment Programs  [place]  [place](2 years) Residential: 67 days)  [facility] ([place], [place], [place] [phone]      [phone] or [phone]  [facility] [place]    [facility] [address]      [place], [place], [place]      [phone]  [place]   [facility] ([facility]) [place]     [address] [place], [place] [address]     [place], [place]      [phone] Admissions: 8am-3pm M-F  [facility]: Residential Program [phone] Days)              [facility]: [facility]  [place], [place], [place] or [phone]    (Walk in Hours over the weekend or by referral)   [place] Crisis: [facility]:[phone] (for crisis  response 24 hours a day)       Please return to ED with any new or worsening signs or symptoms. Please discontinue use of alcohol.  We have offered you Librium.  You have deferred on this.  If you change her mind, please return to the ED. Please utilize the resources you are provided to check into detox program.  Please utilize bus pass to get to detox center.

## 2022-10-28 NOTE — ED Provider Notes (Signed)
[facility] Provider Note   CSN: [id number] Arrival date & time: 10/28/22  [id number]     History  Chief Complaint  Patient presents with   Abdominal Pain   detox    Dakota Kim is a 52 y.o. male with medical history of diabetes.  Patient presents to the ED for evaluation of substance use, abdominal pain.  Patient is requesting detox. Patient reports last alcohol use 6 hours ago, last cocaine use 3 hours ago.  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  Patient reports that he will drink four 40 ounce beers every day which will cause him to want to smoke crack cocaine.  Patient reports that this has been a combination for him for the last 3 years, reports that he has done this every day for the last 3 years.  Patient states that for the last 1 week he has had abdominal pain located centrally in his abdomen.  The pain does not radiate.  Patient also endorsing diarrhea for the last 1 month.  Patient denies fevers, nausea or vomiting, dysuria.  Patient requesting detox as well.  Patient states he currently is living at homeless shelter, [facility].   Abdominal Pain Associated symptoms: diarrhea   Associated symptoms: no dysuria, no fever, no nausea and no vomiting        Home Medications Prior to Admission medications   Medication Sig Start Date End Date Taking? Authorizing Provider  atorvastatin (LIPITOR) 10 MG tablet TAKE 1 TABLET (10 MG TOTAL) BY MOUTH DAILY. 10/24/22   Elsie Stain, MD  glimepiride (AMARYL) 4 MG tablet Take 1 tablet (4 mg total) by mouth daily with breakfast. 10/24/22   Elsie Stain, MD  Insulin Syringe-Needle U-100 30G X 5/16" 0.3 ML MISC Inject 30 Units into the skin 2 (two) times daily. 04/20/22   Cristie Hem, MD  Insulin Glargine (BASAGLAR KWIKPEN) 100 UNIT/ML Inject 20 Units into the skin at bedtime. 10/24/22   Elsie Stain, MD  insulin lispro (HUMALOG KWIKPEN)  100 UNIT/ML KwikPen Inject 30 Units into the skin 2 (two) times daily. With 2 meals, hold if you are skipping a meal 10/24/22   Elsie Stain, MD  Insulin Pen Needle (PEN NEEDLES) 31G X 6 MM MISC Use as directed 3 (three) times daily. 10/24/22   Elsie Stain, MD  sildenafil (VIAGRA) 100 MG tablet Take 1 tablet (100 mg total) by mouth daily as needed for erectile dysfunction. Patient not taking: Reported on 06/13/2022 03/24/21   Vevelyn Francois, NP  ferrous sulfate 325 (65 FE) MG tablet Take 1 tablet (325 mg total) by mouth daily. Patient not taking: Reported on 09/23/2019 08/28/18 07/28/20  Azzie Glatter, FNP  Insulin Lispro Prot & Lispro (HUMALOG MIX 75/25 KWIKPEN) (75-25) 100 UNIT/ML Kwikpen Inject 45 Units into the skin 2 (two) times daily with breakfast and lunch. 10/13/20 10/13/20  Tedd Sias, PA      Allergies    Patient has no known allergies.    Review of Systems   Review of Systems  Constitutional:  Negative for fever.  Gastrointestinal:  Positive for abdominal pain and diarrhea. Negative for nausea and vomiting.  Genitourinary:  Negative for dysuria.    Physical Exam Updated Vital Signs BP 116/83 (BP Location: Right Arm)   Pulse 86   Temp 98.1 F (36.7 C)   Resp 18   Ht 5' 5"$  (  1.651 m)   SpO2 97%   BMI 31.92 kg/m  Physical Exam Vitals and nursing note reviewed.  Constitutional:      General: He is not in acute distress.    Appearance: Normal appearance. He is not ill-appearing, toxic-appearing or diaphoretic.  HENT:     Head: Normocephalic and atraumatic.     Nose: Nose normal. No congestion.     Mouth/Throat:     Mouth: Mucous membranes are moist.     Pharynx: Oropharynx is clear.  Eyes:     Extraocular Movements: Extraocular movements intact.     Conjunctiva/sclera: Conjunctivae normal.     Pupils: Pupils are equal, round, and reactive to light.  Cardiovascular:     Rate and Rhythm: Normal rate and regular rhythm.  Pulmonary:     Effort: Pulmonary  effort is normal.     Breath sounds: Normal breath sounds. No wheezing.  Abdominal:     General: Abdomen is flat. Bowel sounds are normal.     Palpations: Abdomen is soft.     Tenderness: There is no abdominal tenderness.  Musculoskeletal:     Cervical back: Normal range of motion and neck supple. No tenderness.  Skin:    General: Skin is warm and dry.     Capillary Refill: Capillary refill takes less than 2 seconds.  Neurological:     Mental Status: He is alert and oriented to person, place, and time.     ED Results / Procedures / Treatments   Labs (all labs ordered are listed, but only abnormal results are displayed) Labs Reviewed  LIPASE, BLOOD - Abnormal; Notable for the following components:      Result Value   Lipase 137 (*)    All other components within normal limits  RAPID URINE DRUG SCREEN, HOSP PERFORMED - Abnormal; Notable for the following components:   Cocaine POSITIVE (*)    All other components within normal limits  COMPREHENSIVE METABOLIC PANEL - Abnormal; Notable for the following components:   Glucose, Bld 273 (*)    All other components within normal limits  SALICYLATE LEVEL - Abnormal; Notable for the following components:   Salicylate Lvl [id number] (*)    All other components within normal limits  ACETAMINOPHEN LEVEL - Abnormal; Notable for the following components:   Acetaminophen (Tylenol), Serum <10 (*)    All other components within normal limits  CBC WITH DIFFERENTIAL/PLATELET  ETHANOL    EKG None  Radiology CT ABDOMEN PELVIS W CONTRAST  Result Date: 10/28/2022 CLINICAL DATA:  Pancreatitis, acute. Patient complains of abdominal pain near his belly button. Denies nausea or vomiting. History of alcohol and cocaine use approximately an hour ago. EXAM: CT ABDOMEN AND PELVIS WITH CONTRAST TECHNIQUE: Multidetector CT imaging of the abdomen and pelvis was performed using the standard protocol following bolus administration of intravenous contrast.  RADIATION DOSE REDUCTION: This exam was performed according to the departmental dose-optimization program which includes automated exposure control, adjustment of the mA and/or kV according to patient size and/or use of iterative reconstruction technique. CONTRAST:  2m OMNIPAQUE IOHEXOL 350 MG/ML SOLN COMPARISON:  CT abdomen pelvis 08/12/2022 FINDINGS: Lower chest: Bilateral dependent subsegmental atelectasis. Hepatobiliary: No focal liver abnormality is seen. No gallstones, gallbladder wall thickening, or biliary dilatation. Pancreas: Unremarkable. No pancreatic ductal dilatation or surrounding inflammatory changes. Spleen: Normal in size without focal abnormality. Adrenals/Urinary Tract: Adrenal glands are unremarkable. Kidneys are normal, without renal calculi, solid lesion, or hydronephrosis. Moderately distended urinary bladder is unremarkable. Stomach/Bowel: Stomach is within  normal limits. Appendix appears normal. No evidence of bowel wall thickening, distention, or inflammatory changes. Vascular/Lymphatic: No significant vascular findings are present. No enlarged abdominal or pelvic lymph nodes. Reproductive: Prostate is unremarkable. Other: No small fat containing umbilical hernia. No abdominopelvic ascites. Musculoskeletal: No acute or significant osseous findings. IMPRESSION: No acute abnormality in the abdomen or pelvis. Specifically, no findings of pancreatitis as queried. Electronically Signed   By: Ileana Roup M.D.   On: 10/28/2022 09:12   DG Chest 2 View  Result Date: 10/28/2022 CLINICAL DATA:  Shortness of breath EXAM: CHEST - 2 VIEW COMPARISON:  08/12/2022 FINDINGS: Normal heart size and mediastinal contours. No acute infiltrate or edema. No effusion or pneumothorax. No acute osseous findings. IMPRESSION: Negative chest. Electronically Signed   By: Jorje Guild M.D.   On: 10/28/2022 07:25    Procedures Procedures   Medications Ordered in ED Medications  sodium chloride 0.9 % bolus  1,000 mL (0 mLs Intravenous Stopped 10/28/22 0841)  iohexol (OMNIPAQUE) 350 MG/ML injection 75 mL (75 mLs Intravenous Contrast Given 10/28/22 [id number])    ED Course/ Medical Decision Making/ A&P  Medical Decision Making  52 year old male presents to the ED for evaluation.  Please see HPI for further details.  On my examination the patient is afebrile, nontachycardic.  Lung sounds are clear bilaterally, he is not hypoxic on room air.  Abdomen has tenderness in the epigastric region, no overlying skin change.  Neurological examination at baseline.  Patient in no apparent distress.  Patient nontoxic in appearance.  Patient workup initiated in triage includes CBC, CMP, lipase, ethanol, acetaminophen, salicylate, rapid urine drug screen.  Patient CBC shows stable hemoglobin, no leukocytosis.  BMP with stable electrolytes, no electrolyte derangement.  Lipase elevated 137, 2 months ago patient lipase was 31.  This represents an acute change.  Patient will undergo CT abdomen pelvis to assess for cause.  Patient reports he is drinking excessive amount of alcohol and has done so for the last 3 years.  Ethanol undetectable.  Acetaminophen and salicylate levels undetectable.  Rapid urine drug screen positive for cocaine.  Patient endorses is smoking crack cocaine every time he drinks for the last 3 years.  Patient states he is short of breath so chest x-ray will be collected.  Chest x-ray unremarkable.  Patient oxygen saturation 97% on room air.  CT abdomen pelvis shows no evidence of acute pancreatitis or other intra-abdominal pathology.  Social work came to patient bedside and provided him with resources concerning detox.  The patient became agitated at this time, stating that he had wasted his time as he was here wanting detox, not resources.  Patient was provided resources, patient was advised that most detox centers require medical screening examinations and medical clearance prior to admission so this  would have been an inevitable step.  Patient was offered Librium, he deferred.  Patient was advised that he needs to stop drinking due to elevated lipase however patient stated that "I will stop drinking when I feel like it".  Patient requesting discharge repeatedly.  Patient provided bus pass, resources for detox.  Patient advised to stop drinking.  Patient once again offered Librium however patient deferred at this time.  Patient reports he will continue drinking as he pleases.  Patient discharged in stable condition with outpatient detox resources.   Final Clinical Impression(s) / ED Diagnoses Final diagnoses:  Generalized abdominal pain    Rx / DC Orders ED Discharge Orders     None  Azucena Cecil, PA-C 10/28/22 1058    Noemi Chapel, MD 10/29/22 [phone]

## 2022-10-28 NOTE — ED Notes (Signed)
Pt refused review of AVS and vital signs prior to discharge. Bus pass provided. Ambulatory to bus stop.

## 2022-10-28 NOTE — ED Provider Triage Note (Signed)
Emergency Medicine Provider Triage Evaluation Note  Dakota Kim , a 52 y.o. male  was evaluated in triage.  Pt complains of wanting detox.  Came in from [facility], reportedly has been drinking and using crack tonight, now wants detox.  Also has mid abdominal pain without vomiting/diarrhea.  No SI/HI voiced.  Review of Systems  Positive: Abdominal pain, detox Negative: fever  Physical Exam  BP (!) 125/96   Pulse 88   Temp 98.2 F (36.8 C) (Oral)   Resp 16   Ht 5' 5"$  (1.651 m)   SpO2 100%   BMI 31.92 kg/m  Gen:   Awake, no distress   Resp:  Normal effort  MSK:   Moves extremities without difficulty  Other:  Sitting in wheelchair with blanket over head, mumbling when asked questions  Medical Decision Making  Medically screening exam initiated at 5:00 AM.  Appropriate orders placed.  Dakota Kim was informed that the remainder of the evaluation will be completed by another provider, this initial triage assessment does not replace that evaluation, and the importance of remaining in the ED until their evaluation is complete.  Abdominal pain, detox.  Admits to EtOH and crack use today.  Labs ordered.   Dakota Pickett, PA-C 10/28/22 0502

## 2022-10-28 NOTE — ED Triage Notes (Signed)
Pt BIB [facility] from the [facility] c/o wanting detox from alcohol and crack. Pt states he had alcohol about 3 hrs ago and crack about an hour ago. Pt is also c/o abdominal pain near his belly button, denies N/V.

## 2022-10-31 NOTE — Congregational Nurse Program (Signed)
  Dept: [phone]   Congregational Nurse Program Note  Date of Encounter: 10/31/2022  Clinic visit to request assistance with obtaining strips for his glucometer.  States he has taken insulin as prescribed, blood glucose 116 today ac breakfast.  Sent message to Dr. Joya Gaskins for obtaining glucometer supplies.  Past Medical History: Past Medical History:  Diagnosis Date   Diabetes mellitus without complication ([place])    Vitamin D deficiency 09/2019    Encounter Details:  CNP Questionnaire - 10/31/22 0930       Questionnaire   Ask client: Do you give verbal consent for me to treat you today? Yes    Student Assistance N/A    Location Patient [facility]    Visit Setting with Client Organization    Patient Status Unhoused    Insurance Uninsured ([place] Card/Care Connects/Self-Pay/Medicaid Family Planning)    Insurance/Financial Assistance Referral Medicaid    Medication Have Medication Insecurities    Medical Provider No    Screening Referrals Made N/A    Medical Referrals Made [facility] PCP/Clinic    Medical Appointment Made N/A    Recently w/o PCP, now 1st time PCP visit completed due to CNs referral or appointment made N/A    Food Have Food Insecurities    Transportation Need transportation assistance    Housing/Utilities No permanent housing    Interpersonal Safety Do not feel safe at current residence    Interventions Advocate/Support;Case Management;Counsel;Educate    Abnormal to Normal Screening Since Last CN Visit N/A    Screenings CN Performed N/A    Sent Client to Lab for: N/A    Did client attend any of the following based off CNs referral or appointments made? N/A    ED Visit Averted Yes    Life-Saving Intervention Made N/A

## 2022-11-02 DIAGNOSIS — Z794 Long term (current) use of insulin: Secondary | ICD-10-CM

## 2022-11-07 NOTE — Congregational Nurse Program (Signed)
  Dept: [phone]   Congregational Nurse Program Note  Date of Encounter: 11/02/2022  Clinic visit to check blood glucose.  Stares he is out of test strips for his glucometer and didn't take AM insulin since he didn't know blood glucose level.  Glucose 324 approximately 1.5 hours after lunch.  Using insulin pen he self injected Humalog insulin.  Sent email to his current PCP to ask for order for glucometer strips. Past Medical History: Past Medical History:  Diagnosis Date   Diabetes mellitus without complication ([place])    Vitamin D deficiency 09/2019    Encounter Details:  CNP Questionnaire - 11/07/22 1215       Questionnaire   Ask client: Do you give verbal consent for me to treat you today? Yes    Student Assistance N/A    Location Patient [facility]    Visit Setting with Client Organization    Patient Status Unhoused    Insurance Uninsured ([place] Card/Care Connects/Self-Pay/Medicaid Family Planning)    Insurance/Financial Assistance Referral Medicaid    Medication Have Medication Insecurities    Medical Provider No    Screening Referrals Made N/A    Medical Referrals Made [facility]CP[facility]Clinic    Medical Appointment Made N/A    Recently w/o PCP, now 1st time PCP visit completed due to CNs referral or appointment made N/A    Food Have Food Insecurities    Transportation Need transportation assistance    Housing/Utilities No permanent housing    Interpersonal Safety Do not feel safe at current residence    Interventions Advocate/Support;Case Management;Counsel;Navigate Healthcare System;Educate    Abnormal to Normal Screening Since Last CN Visit N/A    Screenings CN Performed Blood Glucose    Sent Client to Lab for: N/A    Did client attend any of the following based off CNs referral or appointments made? N/A    ED Visit Averted N/A    Life-Saving Intervention Made N/A

## 2022-11-15 ENCOUNTER — Other Ambulatory Visit (HOSPITAL_COMMUNITY): Payer: Self-pay

## 2022-11-15 ENCOUNTER — Other Ambulatory Visit: Payer: Self-pay | Admitting: Critical Care Medicine

## 2022-11-15 ENCOUNTER — Encounter: Payer: Self-pay | Admitting: Physician Assistant

## 2022-11-15 MED ORDER — GLIPIZIDE 5 MG PO TABS
5.0000 mg | ORAL_TABLET | Freq: Every day | ORAL | 0 refills | Status: DC
  Filled 2022-11-15: qty 60, 60d supply, fill #0

## 2022-11-15 MED ORDER — BASAGLAR KWIKPEN 100 UNIT/ML ~~LOC~~ SOPN
20.0000 [IU] | PEN_INJECTOR | Freq: Every day | SUBCUTANEOUS | 0 refills | Status: DC
  Filled 2022-11-15: qty 15, 75d supply, fill #0

## 2022-11-15 MED ORDER — INSULIN LISPRO (1 UNIT DIAL) 100 UNIT/ML (KWIKPEN)
30.0000 [IU] | PEN_INJECTOR | Freq: Two times a day (BID) | SUBCUTANEOUS | 0 refills | Status: DC
  Filled 2022-11-15: qty 15, 25d supply, fill #0

## 2022-11-15 NOTE — Progress Notes (Signed)
refills  

## 2022-11-15 NOTE — Progress Notes (Signed)
Pt seen by Dr Joya Gaskins.  Pt had been seen by Ms Plaicy, but wants to transfer to P.W.  Is running low on insulin. He has some pens, keeps them with him. Was advised to keep them in refrigerator when it gets warmer.  Lispro 30 u bid w/ meals. Glargine 20 u qhs.  CBGs 315 after breakfast, generally are < 200. He has been checking regularly.  He filled out the application for Medicaid, needs to check to see if he got it.   He is to hold the amaryl, continue the glipizide 10 mg qd and insulin w/ no dose change.  Continue lisinopril 2.5 mg qd.  Has atorva 10 and 20 mg take one then the other, don't take both.   Meds were updated and refilled as needed.  He has appt at [facility] tomorrow, he has bus passes for this.   Does not have a working phone, will have one soon.   Rosaria Ferries, PA-C 11/15/2022 2:54 PM

## 2022-11-16 NOTE — Congregational Nurse Program (Signed)
  Dept: [phone]   Congregational Nurse Program Note  Date of Encounter: 11/16/2022  Client BP 130/85. Client medications reviewed, Lispro can skip only when no meal. Client stated understanding.  Report sniffles x 3 days, temp. 99.1 today.  Cough drops given.  Past Medical History: Past Medical History:  Diagnosis Date   Diabetes mellitus without complication ([place])    Vitamin D deficiency 09/2019    Encounter Details:  CNP Questionnaire - 11/16/22 1604       Questionnaire   Ask client: Do you give verbal consent for me to treat you today? Yes    Student Assistance N/A    [facility]    Visit Setting with Client Organization    Patient Status Unhoused    Insurance Uninsured ([employer]/Care Connects/Self-Pay/Medicaid Family Planning)    Insurance/Financial Assistance Referral N/A    Medication Have Medication Insecurities    Medical Provider Yes    Screening Referrals Made N/A    Medical Referrals Made N/A    Medical Appointment Made N/A    Recently w/o PCP, now 1st time PCP visit completed due to CNs referral or appointment made N/A    Food Have Food Insecurities    Transportation Need transportation assistance    Housing/Utilities No permanent housing    Interpersonal Safety Do not feel safe at current residence    Interventions Advocate/Support;Case Management;Counsel;Educate;Reviewed Medications    Abnormal to Normal Screening Since Last CN Visit N/A    Screenings CN Performed Blood Pressure;Pulse Ox;Temperature    Sent Client to Lab for: N/A    Did client attend any of the following based off CNs referral or appointments made? N/A    ED Visit Averted N/A    Life-Saving Intervention Made N/A

## 2022-11-29 ENCOUNTER — Encounter: Payer: Self-pay | Admitting: Physician Assistant

## 2022-11-29 ENCOUNTER — Other Ambulatory Visit: Payer: Self-pay | Admitting: Critical Care Medicine

## 2022-11-29 DIAGNOSIS — B351 Tinea unguium: Secondary | ICD-10-CM

## 2022-11-29 DIAGNOSIS — Z794 Long term (current) use of insulin: Secondary | ICD-10-CM

## 2022-11-29 NOTE — Congregational Nurse Program (Signed)
  Dept: [phone]   Congregational Nurse Program Note  Date of Encounter: 11/28/2022  Clinic visit for complaint of selling in feet, has missed taking insulin due to being out of insulin pens.  BP 12/71, pulse 75, O2 Sat 96%.  Unable to check glucose, had just eaten lunch.    Both feet mildly edematous with very dry skin.  Given supplies for cleaning feet and clean socks, advised to elevate both feet as much as possible.  Has appointment with MD on 3/21 and was advised to see [facility] MD on 3/20 if feet not improved. Past Medical History: Past Medical History:  Diagnosis Date   Diabetes mellitus without complication ([place])    Vitamin D deficiency 09/2019    Encounter Details:  CNP Questionnaire - 11/28/22 1150       Questionnaire   Ask client: Do you give verbal consent for me to treat you today? Yes    Student Assistance N/A    Location [facility]    Visit Setting with Client Organization    Patient Status Unhoused    Insurance Uninsured ([employer]/Care Connects/Self-Pay/Medicaid Family Planning)    Insurance/Financial Assistance Referral N/A    Medication Have Medication Insecurities    Medical Provider Yes    Screening Referrals Made N/A    Medical Referrals Made N/A    Medical Appointment Made N/A    Recently w/o PCP, now 1st time PCP visit completed due to CNs referral or appointment made N/A    Food Have Food Insecurities    Transportation Need transportation assistance    Housing/Utilities No permanent housing    Interpersonal Safety Do not feel safe at current residence    Interventions Advocate/Support;Case Management;Counsel;Educate;Reviewed Medications    Abnormal to Normal Screening Since Last CN Visit N/A    Screenings CN Performed Blood Pressure;Pulse Ox;Temperature    Sent Client to Lab for: N/A    Did client attend any of the following based off CNs referral or appointments made? N/A    ED Visit Averted N/A    Life-Saving Intervention  Made N/A

## 2022-11-29 NOTE — Progress Notes (Signed)
Has appt tomorrow w/ PW, will keep it.   Wants to be seen today for painful L great toe.  Has ecchymosis plus white area medial L great toe c/w infection >> ABX sent in.   Was given size 10 Nike tennis shoes, they fit will.   He does not have a phone. Will have to call PW with date/time. Tomi Bamberger will help as well.  ABX will be sent to office, he will keep PW appt tomorrow. Doxycycline 100 mg bid for 7 days.   Rosaria Ferries, PA-C 11/29/2022 3:13 PM

## 2022-11-30 ENCOUNTER — Other Ambulatory Visit: Payer: Self-pay

## 2022-11-30 ENCOUNTER — Ambulatory Visit: Payer: Self-pay | Attending: Critical Care Medicine | Admitting: Critical Care Medicine

## 2022-11-30 ENCOUNTER — Encounter: Payer: Self-pay | Admitting: Critical Care Medicine

## 2022-11-30 VITALS — BP 123/82 | HR 73 | Ht 60.0 in | Wt 168.0 lb

## 2022-11-30 DIAGNOSIS — L089 Local infection of the skin and subcutaneous tissue, unspecified: Secondary | ICD-10-CM | POA: Insufficient documentation

## 2022-11-30 DIAGNOSIS — E876 Hypokalemia: Secondary | ICD-10-CM

## 2022-11-30 DIAGNOSIS — E782 Mixed hyperlipidemia: Secondary | ICD-10-CM

## 2022-11-30 DIAGNOSIS — Z794 Long term (current) use of insulin: Secondary | ICD-10-CM

## 2022-11-30 DIAGNOSIS — E119 Type 2 diabetes mellitus without complications: Secondary | ICD-10-CM

## 2022-11-30 LAB — POCT GLYCOSYLATED HEMOGLOBIN (HGB A1C): Hemoglobin A1C: 9 % — AB (ref 4.0–5.6)

## 2022-11-30 LAB — GLUCOSE, POCT (MANUAL RESULT ENTRY): POC Glucose: 60 mg/dl — AB (ref 70–99)

## 2022-11-30 MED ORDER — DOXYCYCLINE HYCLATE 100 MG PO TABS
100.0000 mg | ORAL_TABLET | Freq: Two times a day (BID) | ORAL | 0 refills | Status: DC
  Filled 2022-11-30: qty 14, 7d supply, fill #0

## 2022-11-30 NOTE — Progress Notes (Signed)
Left foot, big toe still tingling.

## 2022-11-30 NOTE — Progress Notes (Signed)
New Patient Office Visit  Subjective    Patient ID: Dakota Kim, male    DOB: [birth year]  Age: 52 y.o. MRN: [id number]  CC:  Chief Complaint  Patient presents with   Diabetes    HPI Cai Querry presents to establish care Patient comes from the [place] [facility].  He has been homeless for several years.  Diagnosed with diabetes 3 years ago.  He is having foot pain as well.  Also hyperlipidemia.  On arrival blood pressure is good 123/82.  Patient smokes some cigarettes daily.  Does not drink any alcohol.  Does not use any other substances. Patient's blood sugars run in the 120s to 130s.  However his A1c is 9.0. Patient maintains insulin lispro 30 units twice a day with meals hold if holding the meal.  Patient on insulin glargine as well.  Patient on glipizide along with this.  Patient has a infection in his low left great toe Outpatient Encounter Medications as of 11/30/2022  Medication Sig   atorvastatin (LIPITOR) 10 MG tablet TAKE 1 TABLET (10 MG TOTAL) BY MOUTH DAILY.   doxycycline (VIBRA-TABS) 100 MG tablet Take 1 tablet (100 mg total) by mouth 2 (two) times daily.   glipiZIDE (GLUCOTROL) 5 MG tablet Take 1 tablet (5 mg total) by mouth daily before breakfast.   Insulin Glargine (BASAGLAR KWIKPEN) 100 UNIT/ML Inject 20 Units into the skin at bedtime.   insulin lispro (HUMALOG KWIKPEN) 100 UNIT/ML KwikPen Inject 30 Units into the skin 2 (two) times daily. With 2 meals, hold if you are skipping a meal   Insulin Pen Needle (PEN NEEDLES) 31G X 6 MM MISC Use as directed 3 (three) times daily.   Insulin Syringe-Needle U-100 30G X 5/16" 0.3 ML MISC Inject 30 Units into the skin 2 (two) times daily.   sildenafil (VIAGRA) 100 MG tablet Take 1 tablet (100 mg total) by mouth daily as needed for erectile dysfunction. (Patient not taking: Reported on 06/13/2022)   [DISCONTINUED] ferrous sulfate 325 (65 FE) MG tablet Take 1 tablet (325 mg total) by mouth daily. (Patient not taking:  Reported on 09/23/2019)   [DISCONTINUED] Insulin Lispro Prot & Lispro (HUMALOG MIX 75/25 KWIKPEN) (75-25) 100 UNIT/ML Kwikpen Inject 45 Units into the skin 2 (two) times daily with breakfast and lunch.   No facility-administered encounter medications on file as of 11/30/2022.    Past Medical History:  Diagnosis Date   Diabetes mellitus without complication ([place])    Vitamin D deficiency 09/2019    History reviewed. No pertinent surgical history.  Family History  Problem Relation Age of Onset   Diabetes Mellitus II Father     Social History   Socioeconomic History   Marital status: Single    Spouse name: Not on file   Number of children: Not on file   Years of education: Not on file   Highest education level: Not on file  Occupational History   Not on file  Tobacco Use   Smoking status: Some Days    Packs/day: 0.15    Years: 20.00    Additional pack years: 0.00    Total pack years: 3.00    Types: Cigarettes   Smokeless tobacco: Never  Vaping Use   Vaping Use: Never used  Substance and Sexual Activity   Alcohol use: Not Currently    Comment: occassionally   Drug use: No   Sexual activity: Not Currently  Other Topics Concern   Not on file  Social History Narrative  Not on file   Social Determinants of Health   Financial Resource Strain: Not on file  Food Insecurity: Not on file  Transportation Needs: Not on file  Physical Activity: Not on file  Stress: Not on file  Social Connections: Not on file  Intimate Partner Violence: Not on file    Review of Systems  Constitutional:  Negative for chills, diaphoresis, fever, malaise/fatigue and weight loss.  HENT:  Negative for congestion, hearing loss, nosebleeds, sore throat and tinnitus.   Eyes:  Negative for blurred vision, photophobia and redness.  Respiratory:  Negative for cough, hemoptysis, sputum production, shortness of breath, wheezing and stridor.   Cardiovascular:  Negative for chest pain, palpitations,  orthopnea, claudication, leg swelling and PND.  Gastrointestinal:  Negative for abdominal pain, blood in stool, constipation, diarrhea, heartburn, nausea and vomiting.  Genitourinary:  Negative for dysuria, flank pain, frequency, hematuria and urgency.  Musculoskeletal:  Negative for back pain, falls, joint pain, myalgias and neck pain.       Right foot pain  Skin:  Negative for itching and rash.  Neurological:  Negative for dizziness, tingling, tremors, sensory change, speech change, focal weakness, seizures, loss of consciousness, weakness and headaches.  Endo/Heme/Allergies:  Negative for environmental allergies and polydipsia. Does not bruise/bleed easily.  Psychiatric/Behavioral:  Negative for depression, memory loss, substance abuse and suicidal ideas. The patient is not nervous/anxious and does not have insomnia.         Objective    BP 123/82   Pulse 73   Ht 5' (1.524 m)   Wt 168 lb (76.2 kg)   SpO2 99%   BMI 32.81 kg/m   Physical Exam      Assessment & Plan:   Problem List Items Addressed This Visit       Endocrine   Type 2 diabetes mellitus without complication, with long-term current use of insulin (HCC) - Primary    Type 2 diabetes poorly controlled will increase insulin Semglee to 20 units daily continue insulin lispro as prescribed and continue glipizide Obtain urine for microalbumin      Relevant Orders   Urine microalbumin-creatinine with uACR   Comprehensive metabolic panel with eGFR (no eGFR if sent to [facility])   HgB A1c (Completed)   POCT glucose (manual entry) (Completed)   CBC with Differential/Platelet   Lipid panel     Other   Hypokalemia    Reassess potassium      Hyperlipidemia    Reassess lipid panel continue atorvastatin 10 mg daily      Foot infection    Infection involving right great toe will give doxycycline for 7 days and refer to foot and ankle under grant money from shelter       Return in about 2 months (around 01/30/2023)  for htn, diabetes.   Asencion Noble, MD

## 2022-11-30 NOTE — Patient Instructions (Signed)
Podiatry foot doctor referral sent  Start doxycycline twice daily for foot infection sent down stairs Labs will be obtained and urine sample for protein in urine No change in other medications Return Dr Joya Gaskins 2 months, and can continue to see you at shelter

## 2022-11-30 NOTE — Assessment & Plan Note (Signed)
Reassess lipid panel continue atorvastatin 10 mg daily

## 2022-11-30 NOTE — Assessment & Plan Note (Signed)
Reassess potassium

## 2022-11-30 NOTE — Assessment & Plan Note (Addendum)
Type 2 diabetes poorly controlled will increase insulin Semglee to 20 units daily continue insulin lispro as prescribed and continue glipizide Obtain urine for microalbumin

## 2022-11-30 NOTE — Assessment & Plan Note (Signed)
Infection involving right great toe will give doxycycline for 7 days and refer to foot and ankle under grant money from shelter

## 2022-12-01 ENCOUNTER — Telehealth: Payer: Self-pay

## 2022-12-01 ENCOUNTER — Other Ambulatory Visit: Payer: Self-pay | Admitting: Critical Care Medicine

## 2022-12-01 ENCOUNTER — Other Ambulatory Visit: Payer: Self-pay

## 2022-12-01 LAB — LIPID PANEL
Chol/HDL Ratio: 2.2 ratio (ref 0.0–5.0)
Cholesterol, Total: 112 mg/dL (ref 100–199)
HDL: 52 mg/dL (ref >39)
LDL Chol Calc (NIH): 44 mg/dL (ref 0–99)
Triglycerides: 82 mg/dL (ref 0–149)
VLDL Cholesterol Cal: 16 mg/dL (ref 5–40)

## 2022-12-01 LAB — CBC WITH DIFFERENTIAL/PLATELET
Basophils Absolute: 0 10*3/uL (ref 0.0–0.2)
Basos: 0 %
EOS (ABSOLUTE): 0.1 10*3/uL (ref 0.0–0.4)
Eos: 1 %
Hematocrit: 45.2 % (ref 37.5–51.0)
Hemoglobin: 14.6 g/dL (ref 13.0–17.7)
Immature Grans (Abs): 0 10*3/uL (ref 0.0–0.1)
Immature Granulocytes: 0 %
Lymphocytes Absolute: 2.2 10*3/uL (ref 0.7–3.1)
Lymphs: 28 %
MCH: 29.6 pg (ref 26.6–33.0)
MCHC: 32.3 g/dL (ref 31.5–35.7)
MCV: 92 fL (ref 79–97)
Monocytes Absolute: 0.8 10*3/uL (ref 0.1–0.9)
Monocytes: 10 %
Neutrophils Absolute: 4.6 10*3/uL (ref 1.4–7.0)
Neutrophils: 61 %
Platelets: 269 10*3/uL (ref 150–450)
RBC: 4.94 x10E6/uL (ref 4.14–5.80)
RDW: 12.7 % (ref 11.6–15.4)
WBC: 7.7 10*3/uL (ref 3.4–10.8)

## 2022-12-01 LAB — COMPREHENSIVE METABOLIC PANEL
ALT: 13 IU/L (ref 0–44)
AST: 16 IU/L (ref 0–40)
Albumin/Globulin Ratio: 1.5 (ref 1.2–2.2)
Albumin: 4.2 g/dL (ref 3.8–4.9)
Alkaline Phosphatase: 66 IU/L (ref 44–121)
BUN/Creatinine Ratio: 14 (ref 9–20)
BUN: 11 mg/dL (ref 6–24)
Bilirubin Total: 0.2 mg/dL (ref 0.0–1.2)
CO2: 26 mmol/L (ref 20–29)
Calcium: 9.3 mg/dL (ref 8.7–10.2)
Chloride: 104 mmol/L (ref 96–106)
Creatinine, Ser: 0.77 mg/dL (ref 0.76–1.27)
Globulin, Total: 2.8 g/dL (ref 1.5–4.5)
Glucose: 49 mg/dL — ABNORMAL LOW (ref 70–99)
Potassium: 5.2 mmol/L (ref 3.5–5.2)
Sodium: 144 mmol/L (ref 134–144)
Total Protein: 7 g/dL (ref 6.0–8.5)
eGFR: 108 mL/min/{1.73_m2} (ref >59)

## 2022-12-01 MED ORDER — BASAGLAR KWIKPEN 100 UNIT/ML ~~LOC~~ SOPN: 15.0000 [IU] | PEN_INJECTOR | Freq: Every day | SUBCUTANEOUS | 0 refills | Status: DC

## 2022-12-01 NOTE — Progress Notes (Signed)
Let pt know labs all normal blood sugar low, ask him to check his blood sugar and report results today, need to REDUCE his insulin glargine to 15 units daily

## 2022-12-01 NOTE — Telephone Encounter (Signed)
-----   Message from Dakota Stain, MD sent at 12/01/2022  6:01 AM EDT ----- Let pt know labs all normal blood sugar low, ask him to check his blood sugar and report results today, need to REDUCE his insulin glargine to 15 units daily

## 2022-12-01 NOTE — Telephone Encounter (Signed)
Pt was called and vm was left, Information has been sent to nurse pool.   

## 2022-12-03 LAB — MICROALBUMIN / CREATININE URINE RATIO
Creatinine, Urine: 108.2 mg/dL
Microalb/Creat Ratio: 11 mg/g creat (ref 0–29)
Microalbumin, Urine: 12.1 ug/mL

## 2022-12-07 ENCOUNTER — Ambulatory Visit: Payer: Self-pay | Admitting: Podiatry

## 2022-12-13 ENCOUNTER — Encounter: Payer: Self-pay | Admitting: Physician Assistant

## 2022-12-13 NOTE — Progress Notes (Signed)
Pt seen by Dr Joya Gaskins.  He is coming back to the shelter. Has not had insulin or other meds since he left.   At 03/21 office visit, labs were done, pt has not answered the phone to review them.   He says does not have a phone right now, but says the number is the same.  At the clinic visit, was prescribed rx for toe infection, has not finished it yet, will keep taking it. L great toe is swollen, has discoloration to both sides and it is very tender. We will reschedule with the Podiatrist.   CBG 309, will resume insulin at lispro 30 u bid, glargine 30 u qhs and glipizide.   133/80, 74, 93%  Says he left the shelter for personal reasons, does not wish to discuss it.  VS ok, resume all meds. F/u next week.   Rosaria Ferries, PA-C 12/13/2022 3:41 PM

## 2022-12-23 ENCOUNTER — Emergency Department (HOSPITAL_COMMUNITY)
Admission: EM | Admit: 2022-12-23 | Discharge: 2022-12-23 | Disposition: A | Discharge status: Home / Self Care | Payer: Medicaid Other | Attending: Emergency Medicine | Admitting: Emergency Medicine

## 2022-12-23 ENCOUNTER — Encounter (HOSPITAL_COMMUNITY): Payer: Self-pay

## 2022-12-23 DIAGNOSIS — R739 Hyperglycemia, unspecified: Secondary | ICD-10-CM | POA: Diagnosis present

## 2022-12-23 DIAGNOSIS — Z794 Long term (current) use of insulin: Secondary | ICD-10-CM | POA: Diagnosis not present

## 2022-12-23 DIAGNOSIS — Z7984 Long term (current) use of oral hypoglycemic drugs: Secondary | ICD-10-CM | POA: Insufficient documentation

## 2022-12-23 DIAGNOSIS — E1165 Type 2 diabetes mellitus with hyperglycemia: Secondary | ICD-10-CM | POA: Insufficient documentation

## 2022-12-23 LAB — CBG MONITORING, ED
Glucose-Capillary: 339 mg/dL — ABNORMAL HIGH (ref 70–99)
Glucose-Capillary: 231 mg/dL — ABNORMAL HIGH (ref 70–99)
Glucose-Capillary: 295 mg/dL — ABNORMAL HIGH (ref 70–99)

## 2022-12-23 LAB — BASIC METABOLIC PANEL
Anion gap: 4 — ABNORMAL LOW (ref 5–15)
BUN: 13 mg/dL (ref 6–20)
CO2: 26 mmol/L (ref 22–32)
Calcium: 8.3 mg/dL — ABNORMAL LOW (ref 8.9–10.3)
Chloride: 102 mmol/L (ref 98–111)
Creatinine, Ser: 0.7 mg/dL (ref 0.61–1.24)
GFR, Estimated: >60 mL/min (ref >60)
Glucose, Bld: 351 mg/dL — ABNORMAL HIGH (ref 70–99)
Potassium: 4 mmol/L (ref 3.5–5.1)
Sodium: 132 mmol/L — ABNORMAL LOW (ref 135–145)

## 2022-12-23 LAB — CBC
HCT: 42.7 % (ref 39.0–52.0)
Hemoglobin: 13.6 g/dL (ref 13.0–17.0)
MCH: 29.6 pg (ref 26.0–34.0)
MCHC: 31.9 g/dL (ref 30.0–36.0)
MCV: 92.8 fL (ref 80.0–100.0)
Platelets: 268 10*3/uL (ref 150–400)
RBC: 4.6 MIL/uL (ref 4.22–5.81)
RDW: 13.4 % (ref 11.5–15.5)
WBC: 6.9 10*3/uL (ref 4.0–10.5)
nRBC: 0 % (ref 0.0–0.2)

## 2022-12-23 LAB — URINALYSIS, ROUTINE W REFLEX MICROSCOPIC
Bacteria, UA: NONE SEEN
Bilirubin Urine: NEGATIVE
Glucose, UA: >=500 mg/dL — AB
Hgb urine dipstick: NEGATIVE
Ketones, ur: NEGATIVE mg/dL
Leukocytes,Ua: NEGATIVE
Nitrite: NEGATIVE
Protein, ur: NEGATIVE mg/dL
Specific Gravity, Urine: 1.026 (ref 1.005–1.030)
pH: 7 (ref 5.0–8.0)

## 2022-12-23 MED ORDER — SODIUM CHLORIDE 0.9 % IV BOLUS
1000.0000 mL | Freq: Once | INTRAVENOUS | Status: AC
  Administered 2022-12-23: 1000 mL via INTRAVENOUS

## 2022-12-23 MED ORDER — BASAGLAR KWIKPEN 100 UNIT/ML ~~LOC~~ SOPN: 15.0000 [IU] | PEN_INJECTOR | Freq: Every day | SUBCUTANEOUS | 1 refills | Status: DC

## 2022-12-23 MED ORDER — INSULIN LISPRO (1 UNIT DIAL) 100 UNIT/ML (KWIKPEN): 30.0000 [IU] | PEN_INJECTOR | Freq: Two times a day (BID) | SUBCUTANEOUS | Status: DC

## 2022-12-23 MED ORDER — INSULIN LISPRO (1 UNIT DIAL) 100 UNIT/ML (KWIKPEN)
30.0000 [IU] | PEN_INJECTOR | Freq: Two times a day (BID) | SUBCUTANEOUS | 0 refills | Status: DC
  Filled 2022-12-23: qty 15, 25d supply, fill #0

## 2022-12-23 NOTE — ED Triage Notes (Signed)
Pt presents with c/o hyperglycemia and left big toe pain that started this morning. Pt reports the pain in his toe is tingling in nature. Pt is a diabetic, takes daily medication, ran out of his insulin yesterday.

## 2022-12-23 NOTE — Discharge Instructions (Addendum)
Evaluation for your hyperglycemia today was overall reassuring.  Your blood sugar trended down nicely with IV fluids.  I have refilled your home dose of insulin and sent that to your pharmacy.  I have also put in a consult to our transition of care team who should contact you at home to facilitate purchase of your insulin at low cost.  If you have new nausea vomiting diarrhea, fever, shortness of breath, altered mental status or fatigue or any other concerning symptom please return emergency department further evaluation.

## 2022-12-23 NOTE — ED Provider Notes (Signed)
[place] EMERGENCY DEPARTMENT AT [facility] Provider Note   CSN: [id number] Arrival date & time: 12/23/22  1914     History  Chief Complaint  Patient presents with   Hyperglycemia   Toe Pain   HPI Dakota Kim is a 52 y.o. male with type 2 diabetes and hyperlipidemia presenting for hyperglycemia and toe pain. Symptoms started this morning. He checked his blood sugar at home and stated it was in the 300s.  Endorses polyuria, also states he has been mildly short of breath this morning. Denies chest pain. Denies nausea or vomiting and diarrhea.  States he has some pain localized around his umbilicus that is nonradiating.  States at times he notices a "knot" protruding from his umbilicus. Also noticed that he started to have pain in his left great toe.  It feels tingly and worse with ambulation.    Hyperglycemia Toe Pain       Home Medications Prior to Admission medications   Medication Sig Start Date End Date Taking? Authorizing Provider  atorvastatin (LIPITOR) 10 MG tablet TAKE 1 TABLET (10 MG TOTAL) BY MOUTH DAILY. 10/24/22   Storm Frisk, MD  doxycycline (VIBRA-TABS) 100 MG tablet Take 1 tablet (100 mg total) by mouth 2 (two) times daily. 11/30/22   Storm Frisk, MD  glipiZIDE (GLUCOTROL) 5 MG tablet Take 1 tablet (5 mg total) by mouth daily before breakfast. 11/15/22   Storm Frisk, MD  Insulin Glargine [facility]) 100 UNIT/ML Inject 15 Units into the skin at bedtime. 12/23/22   Gareth Eagle, PA-C  insulin lispro (HUMALOG KWIKPEN) 100 UNIT/ML KwikPen Inject 30 Units into the skin 2 (two) times daily. With 2 meals, hold if you are skipping a meal 12/23/22   Gareth Eagle, PA-C  Insulin Pen Needle (PEN NEEDLES) 31G X 6 MM MISC Use as directed 3 (three) times daily. 10/24/22   Storm Frisk, MD  Insulin Syringe-Needle U-100 30G X 5/16" 0.3 ML MISC Inject 30 Units into the skin 2 (two) times daily. 04/20/22   Lonell Grandchild, MD   sildenafil (VIAGRA) 100 MG tablet Take 1 tablet (100 mg total) by mouth daily as needed for erectile dysfunction. Patient not taking: Reported on 06/13/2022 03/24/21   Barbette Merino, NP  ferrous sulfate 325 (65 FE) MG tablet Take 1 tablet (325 mg total) by mouth daily. Patient not taking: Reported on 09/23/2019 08/28/18 07/28/20  Kallie Locks, FNP  Insulin Lispro Prot & Lispro (HUMALOG MIX 75/25 KWIKPEN) (75-25) 100 UNIT/ML Kwikpen Inject 45 Units into the skin 2 (two) times daily with breakfast and lunch. 10/13/20 10/13/20  Gailen Shelter, PA      Allergies    Patient has no known allergies.    Review of Systems   See HPI for pertinent positives  Physical Exam   Vitals:   12/23/22 1122 12/23/22 1123  BP: (!) 125/90   Pulse: 96   Resp: 18   Temp:  98.5 F (36.9 C)  SpO2: 100%     CONSTITUTIONAL:  well-appearing, NAD NEURO:  Alert and oriented x 3, CN 3-12 grossly intact EYES:  eyes equal and reactive ENT/NECK:  Supple, no stridor CARDIO:  regular rate and rhythm, appears well-perfused  PULM:  No respiratory distress, CTAB GI/GU:  non-distended, soft, non tender, No abdominal masses or hernias MSK/SPINE:  No gross deformities, no edema, moves all extremities  SKIN:  no rash, atraumatic   *Additional and/or pertinent findings included in MDM  below   ED Results / Procedures / Treatments   Labs (all labs ordered are listed, but only abnormal results are displayed) Labs Reviewed  BASIC METABOLIC PANEL - Abnormal; Notable for the following components:      Result Value   Sodium 132 (*)    Glucose, Bld 351 (*)    Calcium 8.3 (*)    Anion gap 4 (*)    All other components within normal limits  URINALYSIS, ROUTINE W REFLEX MICROSCOPIC - Abnormal; Notable for the following components:   Glucose, UA >=500 (*)    All other components within normal limits  CBG MONITORING, ED - Abnormal; Notable for the following components:   Glucose-Capillary 339 (*)    All other  components within normal limits  CBG MONITORING, ED - Abnormal; Notable for the following components:   Glucose-Capillary 295 (*)    All other components within normal limits  CBG MONITORING, ED - Abnormal; Notable for the following components:   Glucose-Capillary 231 (*)    All other components within normal limits  CBC    EKG None  Radiology No results found.  Procedures Procedures    Medications Ordered in ED Medications  sodium chloride 0.9 % bolus 1,000 mL (1,000 mLs Intravenous New Bag/Given 12/23/22 1026)    ED Course/ Medical Decision Making/ A&P Clinical Course as of 12/23/22 1242  Sat Dec 23, 2022  0947 Blood sugar trending up but still in the 300s.  Confirmed insulin morning dose at home with patient.  Reordered his lispro, started fluids and advised nurse to give him some crackers and peanut butter.  Will plan to recheck his blood sugar in about an hour. [JR]  1229 After volume resuscitation, blood sugar was 231 without insulin at this point cancel the insulin given downward trend. [JR]    Clinical Course User Index [JR] Gareth Eagle, PA-C                             Medical Decision Making Amount and/or Complexity of Data Reviewed Labs: ordered.   52 year old male who is well-appearing presenting for hyperglycemia.  Exam is unremarkable.  DDx includes DKA, HHS, hyperglycemia, other electrolyte derangement, and sepsis.  Overall patient appears clinically well, suspicion for infection is low as etiology.  Hyperglycemia today is likely related to poor medication compliance.  Patient did state he has had trouble paying for his insulin and ran out yesterday because of cost.  States he does get paid on Monday and has showed me that he will be buying more insulin then.  Pharmacist stated patient and his blood sugar trended down without insulin.  Sent refills of his home regiment of insulin to his pharmacy.  Consulted TOC who will contact him at home to facilitate  purchase of his insulin at low cost.  Patient stable at discharge.  Discussed return precautions pertinent to hyperglycemia exacerbation.  Vital stable at discharge.        Final Clinical Impression(s) / ED Diagnoses Final diagnoses:  Hyperglycemia    Rx / DC Orders ED Discharge Orders          Ordered    Insulin Glargine (BASAGLAR KWIKPEN) 100 UNIT/ML  Daily at bedtime       Note to Pharmacy: Nurse fund   12/23/22 1241    insulin lispro (HUMALOG KWIKPEN) 100 UNIT/ML KwikPen  2 times daily       Note to Pharmacy: Nurse fund  12/23/22 1241              Gareth Eagle, PA-C 12/23/22 1243    Terald Sleeper, MD 12/23/22 [phone]

## 2022-12-23 NOTE — Progress Notes (Signed)
    CSW received consult to assist patient with obtaining prescription medications.  CSW contacted [facility] to learn that patient has medication insurance coverage through [employer] [phone]), Effective 09/11/2022 - 09/11/2023.  CSW contacted [employer] (# [id number]) to confirm yo confirm copay of $35.  Lispro Insulin does require prior authorization.    Danford Bad, BSW, MSW, [employer]  Licensed Visual merchandiser  [employer]  Mailing Address-[address] [place], [place] [address] Physical Address-[address], [place], [place] [address] Toll Free Main # [phone] Fax # [phone] Cell # [phone] [email or web address]

## 2022-12-25 ENCOUNTER — Other Ambulatory Visit: Payer: Self-pay

## 2022-12-26 ENCOUNTER — Other Ambulatory Visit: Payer: Self-pay

## 2022-12-27 ENCOUNTER — Other Ambulatory Visit: Payer: Self-pay | Admitting: Critical Care Medicine

## 2022-12-27 ENCOUNTER — Encounter: Payer: Self-pay | Admitting: Physician Assistant

## 2022-12-27 ENCOUNTER — Other Ambulatory Visit: Payer: Self-pay

## 2022-12-27 ENCOUNTER — Encounter: Payer: Self-pay | Admitting: Critical Care Medicine

## 2022-12-27 MED ORDER — AGAMATRIX ULTRA-THIN LANCETS MISC
1 refills | Status: DC
  Filled 2022-12-27: qty 100, fill #0

## 2022-12-27 MED ORDER — ONETOUCH VERIO W/DEVICE KIT
PACK | 0 refills | Status: DC
  Filled 2022-12-27: qty 1, 30d supply, fill #0

## 2022-12-27 MED ORDER — ONETOUCH DELICA PLUS LANCET33G MISC
12 refills | Status: DC
  Filled 2022-12-27: qty 100, 25d supply, fill #0

## 2022-12-27 MED ORDER — CEFDINIR 300 MG PO CAPS
300.0000 mg | ORAL_CAPSULE | Freq: Two times a day (BID) | ORAL | 0 refills | Status: AC
  Filled 2022-12-27: qty 14, 7d supply, fill #0

## 2022-12-27 MED ORDER — AGAMATRIX AMP TEST VI STRP
ORAL_STRIP | 12 refills | Status: DC
  Filled 2022-12-27: qty 100, fill #0

## 2022-12-27 MED ORDER — ONETOUCH VERIO VI STRP
ORAL_STRIP | 12 refills | Status: DC
  Filled 2022-12-27: qty 100, 25d supply, fill #0

## 2022-12-27 NOTE — Progress Notes (Signed)
For sinus

## 2022-12-27 NOTE — Progress Notes (Signed)
Pt seen by Dr Delford Field.   Pt needs test strips. Aga Matrix Presto meter, strips and ultra thin lancets of that brand were ordered.   He has nasal stuffiness, bad cough for about a week, +yellow sputum.  Has night sweats since this started.  No sore throat.   Was seen in the ER Sat for high CBG, was not in the facility, so missed doses. CBG 339, was given insulin, treated and released.   Took insulin this am. CBG 149 now, did not each much at lunch.   Has pain in his foot at night. Known issues, has appt at [facility] 04/25, encouraged to keep it.   Has insulin bid w/ food Take bedtime insulin as well.   Not on ABX now, had them in March for his foot.   Vitals:   12/27/22 1526  BP: 114/76  Pulse: 73  Temp: 98.5 F (36.9 C)  SpO2: 97%   Has swelling, pus in throat >> sinus infection.   Also w/ some rhonchi >> bronchitis as well.   Put on Cefdinir and given saline nasal spray, and Flonase to reduce nasal congestion. Don't use the saline after the Flonase, use before.    Theodore Demark, PA-C 12/27/2022 3:33 PM

## 2022-12-27 NOTE — Progress Notes (Signed)
rf

## 2023-01-01 ENCOUNTER — Other Ambulatory Visit: Payer: Self-pay | Admitting: Pharmacist

## 2023-01-01 ENCOUNTER — Other Ambulatory Visit: Payer: Self-pay

## 2023-01-01 MED ORDER — ACCU-CHEK GUIDE VI STRP
ORAL_STRIP | 6 refills | Status: DC
  Filled 2023-01-01: qty 100, 33d supply, fill #0

## 2023-01-01 MED ORDER — ACCU-CHEK SOFTCLIX LANCETS MISC
6 refills | Status: DC
  Filled 2023-01-01: qty 100, 33d supply, fill #0

## 2023-01-01 MED ORDER — ACCU-CHEK GUIDE W/DEVICE KIT
PACK | 0 refills | Status: DC
  Filled 2023-01-01: qty 1, 30d supply, fill #0

## 2023-01-04 ENCOUNTER — Other Ambulatory Visit: Payer: Self-pay

## 2023-01-04 ENCOUNTER — Ambulatory Visit: Payer: Medicaid Other | Admitting: Podiatry

## 2023-01-04 DIAGNOSIS — B351 Tinea unguium: Secondary | ICD-10-CM | POA: Diagnosis not present

## 2023-01-04 DIAGNOSIS — M79674 Pain in right toe(s): Secondary | ICD-10-CM

## 2023-01-04 DIAGNOSIS — M79675 Pain in left toe(s): Secondary | ICD-10-CM | POA: Diagnosis not present

## 2023-01-04 DIAGNOSIS — L84 Corns and callosities: Secondary | ICD-10-CM | POA: Diagnosis not present

## 2023-01-04 DIAGNOSIS — E1142 Type 2 diabetes mellitus with diabetic polyneuropathy: Secondary | ICD-10-CM

## 2023-01-04 MED ORDER — GABAPENTIN 300 MG PO CAPS
300.0000 mg | ORAL_CAPSULE | Freq: Two times a day (BID) | ORAL | 2 refills | Status: DC
  Filled 2023-01-04: qty 60, 30d supply, fill #0

## 2023-01-04 NOTE — Progress Notes (Signed)
  Subjective:  Patient ID: Dakota Kim, male    DOB: [birth year],  MRN: [id number]  Chief Complaint  Patient presents with   Nail Problem    DFC/Nail trim/ Foot exam Patient states that he has a tingling feeling that wakes him up out of his sleep, ongoing for about a month.     52 y.o. male presents with the above complaint. History confirmed with patient. Patient presenting with pain related to dystrophic thickened elongated nails. Patient is unable to trim own nails related to nail dystrophy and/or mobility issues. Patient does have a history of T2DM with neuroapthy.  States he is having some burning and tingling sensations that wake him up at night.    Objective:  Physical Exam: warm, good capillary refill nail exam onychomycosis of the toenails, onycholysis, and dystrophic nails On the lateral border of the left hallux there is no to be a small hyperkeratotic lesion.  No underlying ulceration noted There is mild maceration in between the toes bilaterally possible interdigital tinea pedis infection DP pulses palpable, PT pulses palpable, and protective sensation absent Left Foot:  Pain with palpation of nails due to elongation and dystrophic growth.  Right Foot: Pain with palpation of nails due to elongation and dystrophic growth.   Assessment:   1. Pain due to onychomycosis of toenails of both feet   2. DM type 2 with diabetic peripheral neuropathy      Plan:  Patient was evaluated and treated and all questions answered.  # DM2 with neuropathy and neuropathic pain Patient educated on diabetes. Discussed proper diabetic foot care and discussed risks and complications of disease. Educated patient in depth on reasons to return to the office immediately should he/she discover anything concerning or new on the feet. All questions answered. Discussed proper shoes as well.  -E Rx for gabapentin 300 mg twice daily for 90 days for neuropathic pain prescribed.  Discussed the risk  benefits of this medication including risk of drowsiness.  # Callus at the lateral aspect of the left hallux All symptomatic hyperkeratoses were safely debrided with a sterile #15 blade to patient's level of comfort without incident. We discussed preventative and palliative care of these lesions including supportive and accommodative shoegear, padding, prefabricated and custom molded accommodative orthoses, use of a pumice stone and lotions/creams daily. -Keep the webspaces dry and free of maceration.  #Onychomycosis with pain  -Nails palliatively debrided as below. -Educated on self-care  Procedure: Nail Debridement Rationale: Pain Type of Debridement: manual, sharp debridement. Instrumentation: Nail nipper, rotary burr. Number of Nails: 10  Return in about 3 months (around 04/05/2023) for [facility]         Corinna Gab, DPM [facility] / [facility]

## 2023-01-10 ENCOUNTER — Other Ambulatory Visit: Payer: Self-pay

## 2023-01-27 ENCOUNTER — Encounter (HOSPITAL_COMMUNITY): Payer: Self-pay

## 2023-01-27 ENCOUNTER — Emergency Department (HOSPITAL_COMMUNITY)
Admission: EM | Admit: 2023-01-27 | Discharge: 2023-01-27 | Disposition: A | Discharge status: Home / Self Care | Payer: Medicaid Other | Attending: Emergency Medicine | Admitting: Emergency Medicine

## 2023-01-27 ENCOUNTER — Emergency Department (HOSPITAL_COMMUNITY): Payer: Medicaid Other

## 2023-01-27 DIAGNOSIS — E860 Dehydration: Secondary | ICD-10-CM | POA: Insufficient documentation

## 2023-01-27 DIAGNOSIS — R1033 Periumbilical pain: Secondary | ICD-10-CM | POA: Diagnosis present

## 2023-01-27 DIAGNOSIS — E119 Type 2 diabetes mellitus without complications: Secondary | ICD-10-CM | POA: Diagnosis not present

## 2023-01-27 DIAGNOSIS — Z794 Long term (current) use of insulin: Secondary | ICD-10-CM | POA: Insufficient documentation

## 2023-01-27 DIAGNOSIS — Z7984 Long term (current) use of oral hypoglycemic drugs: Secondary | ICD-10-CM | POA: Insufficient documentation

## 2023-01-27 DIAGNOSIS — R1010 Upper abdominal pain, unspecified: Secondary | ICD-10-CM

## 2023-01-27 LAB — CBG MONITORING, ED
Glucose-Capillary: 454 mg/dL — ABNORMAL HIGH (ref 70–99)
Glucose-Capillary: 339 mg/dL — ABNORMAL HIGH (ref 70–99)

## 2023-01-27 LAB — COMPREHENSIVE METABOLIC PANEL
ALT: 15 U/L (ref 0–44)
AST: 19 U/L (ref 15–41)
Albumin: 3.6 g/dL (ref 3.5–5.0)
Alkaline Phosphatase: 60 U/L (ref 38–126)
Anion gap: 9 (ref 5–15)
BUN: 17 mg/dL (ref 6–20)
CO2: 25 mmol/L (ref 22–32)
Calcium: 8.5 mg/dL — ABNORMAL LOW (ref 8.9–10.3)
Chloride: 99 mmol/L (ref 98–111)
Creatinine, Ser: 0.89 mg/dL (ref 0.61–1.24)
GFR, Estimated: >60 mL/min (ref >60)
Glucose, Bld: 410 mg/dL — ABNORMAL HIGH (ref 70–99)
Potassium: 4.5 mmol/L (ref 3.5–5.1)
Sodium: 133 mmol/L — ABNORMAL LOW (ref 135–145)
Total Bilirubin: 0.7 mg/dL (ref 0.3–1.2)
Total Protein: 6.8 g/dL (ref 6.5–8.1)

## 2023-01-27 LAB — CBC WITH DIFFERENTIAL/PLATELET
Abs Immature Granulocytes: 0.02 10*3/uL (ref 0.00–0.07)
Basophils Absolute: 0 10*3/uL (ref 0.0–0.1)
Basophils Relative: 0 %
Eosinophils Absolute: 0 10*3/uL (ref 0.0–0.5)
Eosinophils Relative: 0 %
HCT: 43.5 % (ref 39.0–52.0)
Hemoglobin: 14.2 g/dL (ref 13.0–17.0)
Immature Granulocytes: 0 %
Lymphocytes Relative: 23 %
Lymphs Abs: 1.2 10*3/uL (ref 0.7–4.0)
MCH: 29.8 pg (ref 26.0–34.0)
MCHC: 32.6 g/dL (ref 30.0–36.0)
MCV: 91.2 fL (ref 80.0–100.0)
Monocytes Absolute: 0.5 10*3/uL (ref 0.1–1.0)
Monocytes Relative: 9 %
Neutro Abs: 3.5 10*3/uL (ref 1.7–7.7)
Neutrophils Relative %: 68 %
Platelets: 238 10*3/uL (ref 150–400)
RBC: 4.77 MIL/uL (ref 4.22–5.81)
RDW: 13.9 % (ref 11.5–15.5)
WBC: 5.3 10*3/uL (ref 4.0–10.5)
nRBC: 0 % (ref 0.0–0.2)

## 2023-01-27 MED ORDER — INSULIN ASPART 100 UNIT/ML IJ SOLN
5.0000 [IU] | Freq: Once | INTRAMUSCULAR | Status: AC
  Administered 2023-01-27: 5 [IU] via SUBCUTANEOUS
  Filled 2023-01-27: qty 0.05

## 2023-01-27 MED ORDER — HYDROCODONE-ACETAMINOPHEN 5-325 MG PO TABS
1.0000 | ORAL_TABLET | Freq: Four times a day (QID) | ORAL | 0 refills | Status: DC | PRN
  Filled 2023-01-27: qty 20, 5d supply, fill #0

## 2023-01-27 MED ORDER — HYDROMORPHONE HCL 1 MG/ML IJ SOLN
0.5000 mg | Freq: Once | INTRAMUSCULAR | Status: AC
  Administered 2023-01-27: 0.5 mg via INTRAVENOUS
  Filled 2023-01-27: qty 1

## 2023-01-27 MED ORDER — IOHEXOL 300 MG/ML  SOLN
100.0000 mL | Freq: Once | INTRAMUSCULAR | Status: AC | PRN
  Administered 2023-01-27: 100 mL via INTRAVENOUS

## 2023-01-27 MED ORDER — SODIUM CHLORIDE 0.9 % IV BOLUS
1000.0000 mL | Freq: Once | INTRAVENOUS | Status: AC
  Administered 2023-01-27: 1000 mL via INTRAVENOUS

## 2023-01-27 NOTE — Discharge Instructions (Signed)
Follow-up with [facility] next week to discuss repairing your hernia.  If you start having severe pain or vomiting just return to the ED

## 2023-01-27 NOTE — ED Triage Notes (Signed)
Pt c/o "knot" above umbilicus x1 month and "spots in eyes" and hyperglycemia starting today.  Pain score 9/10.  Pt reports he has not taken his insulin today.  Sts "knot" hurts when he walks.  Denies n/v/d.

## 2023-01-27 NOTE — ED Provider Notes (Signed)
[place] EMERGENCY DEPARTMENT AT [facility] Provider Note   CSN: [id number] Arrival date & time: 01/27/23  4098     History  Chief Complaint  Patient presents with   Abdominal Pain   Hyperglycemia    Dakota Kim is a 52 y.o. male.  Patient complains of periumbilical abdominal pain.  Also she is felt dizzy.  He has a history of diabetes  The history is provided by the patient and medical records. No language interpreter was used.  Abdominal Pain Pain location:  Epigastric Pain quality: aching   Pain radiates to:  Does not radiate Pain severity:  Mild Onset quality:  Sudden Timing:  Constant Progression:  Worsening Chronicity:  New Context: not alcohol use   Associated symptoms: no chest pain, no cough, no diarrhea, no fatigue and no hematuria   Hyperglycemia Associated symptoms: abdominal pain   Associated symptoms: no chest pain and no fatigue        Home Medications Prior to Admission medications   Medication Sig Start Date End Date Taking? Authorizing Provider  HYDROcodone-acetaminophen (NORCO/VICODIN) 5-325 MG tablet Take 1 pill every 6 hours for pain not relieved by Tylenol or Motrin 01/27/23  Yes Bethann Berkshire, MD  Accu-Chek Softclix Lancets lancets Use to check blood pressure three times daily. 01/01/23   Storm Frisk, MD  atorvastatin (LIPITOR) 10 MG tablet TAKE 1 TABLET (10 MG TOTAL) BY MOUTH DAILY. 10/24/22   Storm Frisk, MD  Blood Glucose Monitoring Suppl (ACCU-CHEK GUIDE) w/Device KIT Use to check blood glucose three times daily. 01/01/23   Storm Frisk, MD  gabapentin (NEURONTIN) 300 MG capsule Take 1 capsule (300 mg total) by mouth 2 (two) times daily. 01/04/23 04/04/23  Standiford, Jenelle Mages, DPM  glipiZIDE (GLUCOTROL) 5 MG tablet Take 1 tablet (5 mg total) by mouth daily before breakfast. 11/15/22   Storm Frisk, MD  glucose blood (ACCU-CHEK GUIDE) test strip Use to check blood glucose three times daily. 01/01/23   Storm Frisk, MD  Insulin Glargine [facility] KWIKPEN) 100 UNIT/ML Inject 15 Units into the skin at bedtime. 12/23/22   Gareth Eagle, PA-C  insulin lispro (HUMALOG KWIKPEN) 100 UNIT/ML KwikPen Inject 30 Units into the skin 2 (two) times daily. With 2 meals, hold if you are skipping a meal 12/23/22   Gareth Eagle, PA-C  Insulin Pen Needle (PEN NEEDLES) 31G X 6 MM MISC Use as directed 3 (three) times daily. 10/24/22   Storm Frisk, MD  Insulin Syringe-Needle U-100 30G X 5/16" 0.3 ML MISC Inject 30 Units into the skin 2 (two) times daily. 04/20/22   Lonell Grandchild, MD  sildenafil (VIAGRA) 100 MG tablet Take 1 tablet (100 mg total) by mouth daily as needed for erectile dysfunction. Patient not taking: Reported on 06/13/2022 03/24/21   Barbette Merino, NP  ferrous sulfate 325 (65 FE) MG tablet Take 1 tablet (325 mg total) by mouth daily. Patient not taking: Reported on 09/23/2019 08/28/18 07/28/20  Kallie Locks, FNP  Insulin Lispro Prot & Lispro (HUMALOG MIX 75/25 KWIKPEN) (75-25) 100 UNIT/ML Kwikpen Inject 45 Units into the skin 2 (two) times daily with breakfast and lunch. 10/13/20 10/13/20  Gailen Shelter, PA      Allergies    Patient has no known allergies.    Review of Systems   Review of Systems  Constitutional:  Negative for appetite change and fatigue.  HENT:  Negative for congestion, ear discharge and sinus pressure.  Eyes:  Negative for discharge.  Respiratory:  Negative for cough.   Cardiovascular:  Negative for chest pain.  Gastrointestinal:  Positive for abdominal pain. Negative for diarrhea.  Genitourinary:  Negative for frequency and hematuria.  Musculoskeletal:  Negative for back pain.  Skin:  Negative for rash.  Neurological:  Negative for seizures and headaches.  Psychiatric/Behavioral:  Negative for hallucinations.     Physical Exam Updated Vital Signs BP 128/79   Pulse 84   Temp 98.5 F (36.9 C) (Oral)   Resp 18   Ht 5\' 6"  (1.676 m)   Wt 72.6 kg   SpO2  100%   BMI 25.82 kg/m  Physical Exam Vitals and nursing note reviewed.  Constitutional:      Appearance: He is well-developed.  HENT:     Head: Normocephalic.     Nose: Nose normal.  Eyes:     General: No scleral icterus.    Conjunctiva/sclera: Conjunctivae normal.  Neck:     Thyroid: No thyromegaly.  Cardiovascular:     Rate and Rhythm: Normal rate and regular rhythm.     Heart sounds: No murmur heard.    No friction rub. No gallop.  Pulmonary:     Breath sounds: No stridor. No wheezing or rales.  Chest:     Chest wall: No tenderness.  Abdominal:     General: There is no distension.     Tenderness: There is no abdominal tenderness. There is no rebound.  Musculoskeletal:        General: Normal range of motion.     Cervical back: Neck supple.  Lymphadenopathy:     Cervical: No cervical adenopathy.  Skin:    Findings: No erythema or rash.  Neurological:     Mental Status: He is alert and oriented to person, place, and time.     Motor: No abnormal muscle tone.     Coordination: Coordination normal.  Psychiatric:        Behavior: Behavior normal.     ED Results / Procedures / Treatments   Labs (all labs ordered are listed, but only abnormal results are displayed) Labs Reviewed  COMPREHENSIVE METABOLIC PANEL - Abnormal; Notable for the following components:      Result Value   Sodium 133 (*)    Glucose, Bld 410 (*)    Calcium 8.5 (*)    All other components within normal limits  CBG MONITORING, ED - Abnormal; Notable for the following components:   Glucose-Capillary 454 (*)    All other components within normal limits  CBG MONITORING, ED - Abnormal; Notable for the following components:   Glucose-Capillary 339 (*)    All other components within normal limits  CBC WITH DIFFERENTIAL/PLATELET    EKG None  Radiology CT ABDOMEN PELVIS W CONTRAST  Result Date: 01/27/2023 CLINICAL DATA:  Abdominal pain, acute, nonlocalized EXAM: CT ABDOMEN AND PELVIS WITH  CONTRAST TECHNIQUE: Multidetector CT imaging of the abdomen and pelvis was performed using the standard protocol following bolus administration of intravenous contrast. RADIATION DOSE REDUCTION: This exam was performed according to the departmental dose-optimization program which includes automated exposure control, adjustment of the mA and/or kV according to patient size and/or use of iterative reconstruction technique. CONTRAST:  OMNIPAQUE IOHEXOL 300 MG/ML  SOLN COMPARISON:  10/28/2022 FINDINGS: Lower chest: Mild bibasilar subsegmental atelectasis. Heart size is within normal limits. Hepatobiliary: No focal liver abnormality is seen. No gallstones, gallbladder wall thickening, or biliary dilatation. Pancreas: Unremarkable. No pancreatic ductal dilatation or surrounding inflammatory  changes. Spleen: Normal in size without focal abnormality. Adrenals/Urinary Tract: Unremarkable adrenal glands. Kidneys enhance symmetrically without solid lesion, stone, or hydronephrosis. Ureters are nondilated. Urinary bladder appears unremarkable for the degree of distention. Stomach/Bowel: Stomach is within normal limits. Appendix appears normal. No evidence of bowel wall thickening, distention, or inflammatory changes. Vascular/Lymphatic: No significant vascular findings are present. No enlarged abdominal or pelvic lymph nodes. Reproductive: Prostate is unremarkable. Other: No free fluid. No abdominopelvic fluid collection. No pneumoperitoneum. Small fat containing umbilical hernia with a similar degree of mild fat stranding within the hernia sac. Musculoskeletal: No acute or significant osseous findings. IMPRESSION: 1. Small fat-containing umbilical hernia with mild fat stranding within the hernia sac, similar to prior. Correlate for hernia incarceration. 2. Otherwise, no acute abdominopelvic findings. Electronically Signed   By: Duanne Guess D.O.   On: 01/27/2023 12:39    Procedures Procedures    Medications  Ordered in ED Medications  sodium chloride 0.9 % bolus 1,000 mL (0 mLs Intravenous Stopped 01/27/23 1520)  HYDROmorphone (DILAUDID) injection 0.5 mg (0.5 mg Intravenous Given 01/27/23 1041)  insulin aspart (novoLOG) injection 5 Units (5 Units Subcutaneous Given 01/27/23 1029)  iohexol (OMNIPAQUE) 300 MG/ML solution 100 mL (100 mLs Intravenous Contrast Given 01/27/23 1204)    ED Course/ Medical Decision Making/ A&P                             Medical Decision Making Amount and/or Complexity of Data Reviewed Labs: ordered. Radiology: ordered.  Risk Prescription drug management.   This patient presents to the ED for concern of abdominal pain, this involves an extensive number of treatment options, and is a complaint that carries with it a high risk of complications and morbidity.  The differential diagnosis includes appendicitis, cholecystitis   Co morbidities that complicate the patient evaluation  Diabetes   Additional history obtained:  Additional history obtained from patient External records from outside source obtained and reviewed including hospital records   Lab Tests:  I Ordered, and personally interpreted labs.  The pertinent results include: Glucose 410   Imaging Studies ordered:  I ordered imaging studies including CT and abdomen I independently visualized and interpreted imaging which showed umbilical hernia with fat I agree with the radiologist interpretation   Cardiac Monitoring: / EKG:  The patient was maintained on a cardiac monitor.  I personally viewed and interpreted the cardiac monitored which showed an underlying rhythm of: Normal sinus rhythm   Consultations Obtained:  No consultant  Problem List / ED Course / Critical interventions / Medication management  Abdominal pain and I ordered medication including normal saline and insulin Reevaluation of the patient after these medicines showed that the patient improved I have reviewed the  patients home medicines and have made adjustments as needed   Social Determinants of Health:  None none   Test / Admission - Considered:  none  Patient with umbilical hernia with some fat inside.  Patient will be referred to surgery.  He also had hyperglycemia that has improved with fluids and insulin    ent critical care time when appropriate:1}      Final Clinical Impression(s) / ED Diagnoses Final diagnoses:  Pain of upper abdomen  Dehydration    Rx / DC Orders ED Discharge Orders          Ordered    HYDROcodone-acetaminophen (NORCO/VICODIN) 5-325 MG tablet        01/27/23 1534  Bethann Berkshire, MD 01/27/23 2002

## 2023-01-29 ENCOUNTER — Other Ambulatory Visit (HOSPITAL_BASED_OUTPATIENT_CLINIC_OR_DEPARTMENT_OTHER): Payer: Self-pay

## 2023-01-29 ENCOUNTER — Other Ambulatory Visit: Payer: Self-pay

## 2023-02-01 ENCOUNTER — Ambulatory Visit: Payer: Medicaid Other | Attending: Critical Care Medicine | Admitting: Critical Care Medicine

## 2023-02-05 ENCOUNTER — Other Ambulatory Visit: Payer: Self-pay

## 2023-02-05 ENCOUNTER — Emergency Department (HOSPITAL_COMMUNITY)
Admission: EM | Admit: 2023-02-05 | Discharge: 2023-02-05 | Disposition: A | Discharge status: Home / Self Care | Payer: Medicaid Other | Attending: Emergency Medicine | Admitting: Emergency Medicine

## 2023-02-05 ENCOUNTER — Encounter (HOSPITAL_COMMUNITY): Payer: Self-pay

## 2023-02-05 DIAGNOSIS — R739 Hyperglycemia, unspecified: Secondary | ICD-10-CM | POA: Diagnosis present

## 2023-02-05 DIAGNOSIS — E1165 Type 2 diabetes mellitus with hyperglycemia: Secondary | ICD-10-CM | POA: Insufficient documentation

## 2023-02-05 DIAGNOSIS — Z794 Long term (current) use of insulin: Secondary | ICD-10-CM | POA: Diagnosis not present

## 2023-02-05 DIAGNOSIS — Z7984 Long term (current) use of oral hypoglycemic drugs: Secondary | ICD-10-CM | POA: Insufficient documentation

## 2023-02-05 DIAGNOSIS — Z5900 Homelessness unspecified: Secondary | ICD-10-CM | POA: Diagnosis not present

## 2023-02-05 LAB — HEPATIC FUNCTION PANEL
ALT: 12 U/L (ref 0–44)
AST: 16 U/L (ref 15–41)
Albumin: 4.1 g/dL (ref 3.5–5.0)
Alkaline Phosphatase: 83 U/L (ref 38–126)
Bilirubin, Direct: 0.3 mg/dL — ABNORMAL HIGH (ref 0.0–0.2)
Indirect Bilirubin: 0.9 mg/dL (ref 0.3–0.9)
Total Bilirubin: 1.2 mg/dL (ref 0.3–1.2)
Total Protein: 7.5 g/dL (ref 6.5–8.1)

## 2023-02-05 LAB — BASIC METABOLIC PANEL
Anion gap: 9 (ref 5–15)
BUN: 11 mg/dL (ref 6–20)
CO2: 26 mmol/L (ref 22–32)
Calcium: 9.1 mg/dL (ref 8.9–10.3)
Chloride: 103 mmol/L (ref 98–111)
Creatinine, Ser: 0.92 mg/dL (ref 0.61–1.24)
GFR, Estimated: >60 mL/min (ref >60)
Glucose, Bld: 380 mg/dL — ABNORMAL HIGH (ref 70–99)
Potassium: 4 mmol/L (ref 3.5–5.1)
Sodium: 138 mmol/L (ref 135–145)

## 2023-02-05 LAB — I-STAT VENOUS BLOOD GAS, ED
Acid-Base Excess: 0 mmol/L (ref 0.0–2.0)
Bicarbonate: 26.9 mmol/L (ref 20.0–28.0)
Calcium, Ion: 1.22 mmol/L (ref 1.15–1.40)
HCT: 50 % (ref 39.0–52.0)
Hemoglobin: 17 g/dL (ref 13.0–17.0)
O2 Saturation: 49 %
Potassium: 3.9 mmol/L (ref 3.5–5.1)
Sodium: 140 mmol/L (ref 135–145)
TCO2: 28 mmol/L (ref 22–32)
pCO2, Ven: 48.6 mmHg (ref 44–60)
pH, Ven: 7.352 (ref 7.25–7.43)
pO2, Ven: 28 mmHg — CL (ref 32–45)

## 2023-02-05 LAB — CBC
HCT: 49.7 % (ref 39.0–52.0)
Hemoglobin: 16 g/dL (ref 13.0–17.0)
MCH: 29.6 pg (ref 26.0–34.0)
MCHC: 32.2 g/dL (ref 30.0–36.0)
MCV: 92 fL (ref 80.0–100.0)
Platelets: 266 10*3/uL (ref 150–400)
RBC: 5.4 MIL/uL (ref 4.22–5.81)
RDW: 13.9 % (ref 11.5–15.5)
WBC: 7.2 10*3/uL (ref 4.0–10.5)
nRBC: 0 % (ref 0.0–0.2)

## 2023-02-05 LAB — CBG MONITORING, ED
Glucose-Capillary: 380 mg/dL — ABNORMAL HIGH (ref 70–99)
Glucose-Capillary: 342 mg/dL — ABNORMAL HIGH (ref 70–99)
Glucose-Capillary: 213 mg/dL — ABNORMAL HIGH (ref 70–99)

## 2023-02-05 LAB — BETA-HYDROXYBUTYRIC ACID: Beta-Hydroxybutyric Acid: 0.21 mmol/L (ref 0.05–0.27)

## 2023-02-05 LAB — LIPASE, BLOOD: Lipase: 36 U/L (ref 11–51)

## 2023-02-05 MED ORDER — SODIUM CHLORIDE 0.9 % IV BOLUS
1000.0000 mL | Freq: Once | INTRAVENOUS | Status: AC
  Administered 2023-02-05: 1000 mL via INTRAVENOUS

## 2023-02-05 MED ORDER — INSULIN ASPART 100 UNIT/ML IJ SOLN
10.0000 [IU] | Freq: Once | INTRAMUSCULAR | Status: AC
  Administered 2023-02-05: 10 [IU] via INTRAVENOUS

## 2023-02-05 NOTE — ED Provider Notes (Signed)
[place] EMERGENCY DEPARTMENT AT [facility] Provider Note   CSN: [id number] Arrival date & time: 02/05/23  1609     History  Chief Complaint  Patient presents with   Hyperglycemia   Abdominal Pain    Dakota Kim is a 52 y.o. male.   Hyperglycemia Associated symptoms: abdominal pain   Abdominal Pain Patient presents with abdominal pain and hyperglycemia.  He is homeless.  Reportedly he is thirsty and frequent urination.  Reportedly has been out of his insulin for at least 3 days.  Asked why he is out of it states that because he is homeless.  Also upper abdominal pain.  History of same with hyperglycemia.  No fevers.  No diarrhea.    Past Medical History:  Diagnosis Date   Diabetes mellitus without complication (HCC)    Vitamin D deficiency 09/2019    Home Medications Prior to Admission medications   Medication Sig Start Date End Date Taking? Authorizing Provider  Accu-Chek Softclix Lancets lancets Use to check blood pressure three times daily. 01/01/23   Storm Frisk, MD  atorvastatin (LIPITOR) 10 MG tablet TAKE 1 TABLET (10 MG TOTAL) BY MOUTH DAILY. 10/24/22   Storm Frisk, MD  Blood Glucose Monitoring Suppl (ACCU-CHEK GUIDE) w/Device KIT Use to check blood glucose three times daily. 01/01/23   Storm Frisk, MD  gabapentin (NEURONTIN) 300 MG capsule Take 1 capsule (300 mg total) by mouth 2 (two) times daily. 01/04/23 04/04/23  Standiford, Jenelle Mages, DPM  glipiZIDE (GLUCOTROL) 5 MG tablet Take 1 tablet (5 mg total) by mouth daily before breakfast. 11/15/22   Storm Frisk, MD  glucose blood (ACCU-CHEK GUIDE) test strip Use to check blood glucose three times daily. 01/01/23   Storm Frisk, MD  HYDROcodone-acetaminophen (NORCO/VICODIN) 5-325 MG tablet Take 1 tablet by mouth every 6 (six) hours as needed not relieved by Tylenol or Motrin. 01/27/23   Bethann Berkshire, MD  Insulin Glargine [facility]) 100 UNIT/ML Inject 15 Units into the skin  at bedtime. 12/23/22   Gareth Eagle, PA-C  insulin lispro (HUMALOG KWIKPEN) 100 UNIT/ML KwikPen Inject 30 Units into the skin 2 (two) times daily. With 2 meals, hold if you are skipping a meal 12/23/22   Gareth Eagle, PA-C  Insulin Pen Needle (PEN NEEDLES) 31G X 6 MM MISC Use as directed 3 (three) times daily. 10/24/22   Storm Frisk, MD  Insulin Syringe-Needle U-100 30G X 5/16" 0.3 ML MISC Inject 30 Units into the skin 2 (two) times daily. 04/20/22   Lonell Grandchild, MD  sildenafil (VIAGRA) 100 MG tablet Take 1 tablet (100 mg total) by mouth daily as needed for erectile dysfunction. Patient not taking: Reported on 06/13/2022 03/24/21   Barbette Merino, NP  ferrous sulfate 325 (65 FE) MG tablet Take 1 tablet (325 mg total) by mouth daily. Patient not taking: Reported on 09/23/2019 08/28/18 07/28/20  Kallie Locks, FNP  Insulin Lispro Prot & Lispro (HUMALOG MIX 75/25 KWIKPEN) (75-25) 100 UNIT/ML Kwikpen Inject 45 Units into the skin 2 (two) times daily with breakfast and lunch. 10/13/20 10/13/20  Gailen Shelter, PA      Allergies    Patient has no known allergies.    Review of Systems   Review of Systems  Gastrointestinal:  Positive for abdominal pain.    Physical Exam Updated Vital Signs BP 121/75 (BP Location: Right Arm)   Pulse 94   Temp 99.3 F (37.4 C) (Oral)  Resp 16   SpO2 100%  Physical Exam Vitals reviewed.  Cardiovascular:     Rate and Rhythm: Normal rate.  Abdominal:     Tenderness: There is abdominal tenderness.     Comments: Mild upper abdominal tenderness rebound or guarding.  No hernia palpated.  Skin:    General: Skin is warm.  Neurological:     Mental Status: He is alert.     ED Results / Procedures / Treatments   Labs (all labs ordered are listed, but only abnormal results are displayed) Labs Reviewed  BASIC METABOLIC PANEL - Abnormal; Notable for the following components:      Result Value   Glucose, Bld 380 (*)    All other components  within normal limits  HEPATIC FUNCTION PANEL - Abnormal; Notable for the following components:   Bilirubin, Direct 0.3 (*)    All other components within normal limits  CBG MONITORING, ED - Abnormal; Notable for the following components:   Glucose-Capillary 380 (*)    All other components within normal limits  CBG MONITORING, ED - Abnormal; Notable for the following components:   Glucose-Capillary 342 (*)    All other components within normal limits  I-STAT VENOUS BLOOD GAS, ED - Abnormal; Notable for the following components:   pO2, Ven 28 (*)    All other components within normal limits  CBG MONITORING, ED - Abnormal; Notable for the following components:   Glucose-Capillary 213 (*)    All other components within normal limits  CBC  LIPASE, BLOOD  BETA-HYDROXYBUTYRIC ACID  URINALYSIS, ROUTINE W REFLEX MICROSCOPIC    EKG None  Radiology No results found.  Procedures Procedures    Medications Ordered in ED Medications  sodium chloride 0.9 % bolus 1,000 mL (0 mLs Intravenous Stopped 02/05/23 1855)  insulin aspart (novoLOG) injection 10 Units (10 Units Intravenous Given 02/05/23 1854)    ED Course/ Medical Decision Making/ A&P                             Medical Decision Making Amount and/or Complexity of Data Reviewed Labs: ordered.  Risk Prescription drug management.   Patient with hyperglycemia abdominal pain.  History of same.  Differential diagnosis includes hyperglycemia, DKA, dehydration, some intra-abdominal pathology.  Will get basic blood work.  Venous blood gas reassuring.  Has known umbilical hernia.  Does reduce here although there is some tenderness.  Blood work reassuring.  Mild hyperglycemia.  Benign abdominal workup.  Sugars improved with fluids.  Discussed with transitions of care.  [facility]  Patient instructed he can pick them up there.  Will discharge.        Final Clinical  Impression(s) / ED Diagnoses Final diagnoses:  Hyperglycemia    Rx / DC Orders ED Discharge Orders     None         Benjiman Core, MD 02/05/23 2235

## 2023-02-05 NOTE — Discharge Instructions (Addendum)
Your medicines are available at the health and wellness center.  Your blood work is reassuring.

## 2023-02-05 NOTE — ED Triage Notes (Addendum)
Homeless patient. Picked up by EMS for hyperglycemia. BG 415 per EMS. Patient is A+Ox4, ambulatory, thirsty, frequent urination. Patient received approximately of NS per EMS. Has not had insulin in 3 days. Patient reporting abdominal pain.

## 2023-02-06 ENCOUNTER — Other Ambulatory Visit: Payer: Self-pay

## 2023-02-08 ENCOUNTER — Encounter: Payer: Self-pay | Admitting: Critical Care Medicine

## 2023-03-01 ENCOUNTER — Ambulatory Visit (HOSPITAL_COMMUNITY)
Admission: EM | Admit: 2023-03-01 | Discharge: 2023-03-03 | Disposition: A | Discharge status: Other Acute Inpatient Hospital | Payer: Medicaid Other | Attending: Psychiatry | Admitting: Psychiatry

## 2023-03-01 DIAGNOSIS — F1994 Other psychoactive substance use, unspecified with psychoactive substance-induced mood disorder: Secondary | ICD-10-CM | POA: Diagnosis present

## 2023-03-01 DIAGNOSIS — F1910 Other psychoactive substance abuse, uncomplicated: Secondary | ICD-10-CM | POA: Diagnosis present

## 2023-03-01 DIAGNOSIS — Z560 Unemployment, unspecified: Secondary | ICD-10-CM | POA: Insufficient documentation

## 2023-03-01 DIAGNOSIS — Z5900 Homelessness unspecified: Secondary | ICD-10-CM | POA: Insufficient documentation

## 2023-03-01 DIAGNOSIS — R45851 Suicidal ideations: Secondary | ICD-10-CM | POA: Insufficient documentation

## 2023-03-01 DIAGNOSIS — F1410 Cocaine abuse, uncomplicated: Secondary | ICD-10-CM | POA: Insufficient documentation

## 2023-03-01 MED ORDER — ALUM & MAG HYDROXIDE-SIMETH 200-200-20 MG/5ML PO SUSP: 30.0000 mL | ORAL | Status: DC | PRN

## 2023-03-01 MED ORDER — LORAZEPAM 1 MG PO TABS
1.0000 mg | ORAL_TABLET | ORAL | Status: AC | PRN
  Administered 2023-03-02: 1 mg via ORAL
  Filled 2023-03-01: qty 1

## 2023-03-01 MED ORDER — ACETAMINOPHEN 325 MG PO TABS: 650.0000 mg | ORAL_TABLET | Freq: Four times a day (QID) | ORAL | Status: DC | PRN

## 2023-03-01 MED ORDER — ATORVASTATIN CALCIUM 10 MG PO TABS
10.0000 mg | ORAL_TABLET | Freq: Every day | ORAL | Status: DC
  Administered 2023-03-02 – 2023-03-03 (×2): 10 mg via ORAL
  Filled 2023-03-01 (×2): qty 1

## 2023-03-01 MED ORDER — OLANZAPINE 10 MG PO TBDP: 10.0000 mg | ORAL_TABLET | Freq: Three times a day (TID) | ORAL | Status: DC | PRN

## 2023-03-01 MED ORDER — INSULIN LISPRO (1 UNIT DIAL) 100 UNIT/ML (KWIKPEN): 30.0000 [IU] | PEN_INJECTOR | Freq: Two times a day (BID) | SUBCUTANEOUS | Status: DC

## 2023-03-01 MED ORDER — GABAPENTIN 300 MG PO CAPS
300.0000 mg | ORAL_CAPSULE | Freq: Two times a day (BID) | ORAL | Status: DC
  Administered 2023-03-02 – 2023-03-03 (×4): 300 mg via ORAL
  Filled 2023-03-01 (×4): qty 1

## 2023-03-01 MED ORDER — ZIPRASIDONE MESYLATE 20 MG IM SOLR: 20.0000 mg | INTRAMUSCULAR | Status: DC | PRN

## 2023-03-01 MED ORDER — MAGNESIUM HYDROXIDE 400 MG/5ML PO SUSP: 30.0000 mL | Freq: Every day | ORAL | Status: DC | PRN

## 2023-03-01 MED ORDER — GLIPIZIDE 5 MG PO TABS
5.0000 mg | ORAL_TABLET | Freq: Every day | ORAL | Status: DC
  Administered 2023-03-02 – 2023-03-03 (×2): 5 mg via ORAL
  Filled 2023-03-01 (×2): qty 1

## 2023-03-01 MED ORDER — PEN NEEDLES 31G X 6 MM MISC: 1.0000 "pen " | Freq: Three times a day (TID) | Status: DC

## 2023-03-01 NOTE — Progress Notes (Signed)
   03/01/23 03-28-2007  [facility] Triage Screening (Walk-ins at [facility] only)  How Did You Hear About [place]? Self  What Is the Reason for Your Visit/Call Today? Pt was referred to [facility] by his therapist.  Therapist is Fleet Contras.  Pt had been staying at the [employer].  He talked to therapist Fleet Contras there at 13:00 today.  Pt had SI earlier today until he talked to therapist.  In the last month he has had thoughts of taking pain pills and drinking ETOH.  Pt had a previous suicide attempt two years ago, same plan.   Pt denies any HI or A/V hallucinations.  Patient is interested in getting help for his crack cocaine use.  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  Current use is daily "from the time I wake up until my body shuts down from using it. "  Last use was on 06/19 and he said he used consisttently from last Friday to this Wednesday (06/20).  Pt says he may use ETOH once in awhile.  Pt says he is a diabetic and he does not drink because it elevats his shugar.  Pt is insulin dependent and his adherence is "hit or miss."  He says he takes 30 units in AM and 20 units in PM.  Last insulin use was last Friday (06/14).  Pt says "I can get guns"  Pt reports sleep has been off due to using drugs and his appetite is normal when he is not using drugs.  Pt is currently homeless.  How Long Has This Been Causing You Problems? 1-6 months  Have You Recently Had Any Thoughts About Hurting Yourself? Yes  How long ago did you have thoughts about hurting yourself? Earlier today  Are You Planning to Commit Suicide/Harm Yourself At This time? No  Have you Recently Had Thoughts About Hurting Someone [name]? No  Are You Planning To Harm Someone At This Time? No  Are you currently experiencing any auditory, visual or other hallucinations? No  Have You Used Any Alcohol or Drugs in the Past 24 Hours? Yes  How long ago did you use Drugs or Alcohol? Used crack cocaine yesterday.  What Did You Use and How Much?  Crack cocaine.  Unknown amount "a lot."  Do you have any current medical co-morbidities that require immediate attention? Yes  Please describe current medical co-morbidities that require immediate attention: Pt may need to have his glucose read.  He has not taken insulin in over a week.  Clinician description of patient physical appearance/behavior: Pt is polite.  Good eye contac.  Pt is not responding to internal stimuli and he is oriented x4.  What Do You Feel Would Help You the Most Today? Treatment for Depression or other mood problem;Housing Assistance;Medication(s)  If access to [facility] was not available, would you have sought care in the Emergency Department? No  Determination of Need Urgent (48 hours)  Options For Referral [facility];Facility-Based Crisis;Outpatient Therapy;Medication Management

## 2023-03-01 NOTE — ED Provider Notes (Signed)
[facility] Continuous Assessment Admission H&P  Date: 03/02/23 Patient Name: Dakota Kim MRN: [id number] Chief Complaint: suicidal and /homelessness  Diagnoses:  Final diagnoses:  Suicidal ideation  Cocaine abuse (HCC)  Homelessness unspecified    HPI: Dakota Kim, 52 y/o male with a history of Cocaine abuse presented to [facility], voluntarily.  According to patient he wants help for his crack cocaine use and he is homeless and he needs somewhere to stay.  Patient can be very hostile at times when writer first entered the room to speak with patient, he stated that he needs food, Clinical research associate discussed with patient the need to get through the interview process and then will get him some food patient's got upset stating I need food now.  Patient becomes verbally abusive.  When asked patient how can we help patient stated I need detox from crack when asked how frequent does use crack patient stated he is using everyday for years.  Patient is currently unemployed, and homeless.  Patient stated he was also suicidal when asked what was his plan he said he would take pain medicine when asked if he was prescribed pain medicine patient stated he can get it off the street.  According to patient he is currently not seeing a psychiatrist at this time and stated he is not seeing a therapist either however TTS notes did state that patient saw a therapist today and was recommended to come to [facility].  Face-to-face observation of patient, patient is alert and oriented x 4, speech is clear, maintaining eye contact.  Patient is very angry and lashed out very easily patient keeps changing his story over and over so is not 100% clear if patient is forthcoming with information or patient is malingering for somewhere to stay.  At 1 point patient stated he was going to leave but then he just changed his mind and said the bus is not running, then patient stated I just needs somewhere to sleep for a couple hours.  Patient  endorsed suicidal ideation stating he was going to take pills and drink alcohol.  Patient denies HI, AVH or paranoia.  Patient does appear to be malingering, does appear that he is doing this for secondary gain however because patient did have a past history of suicidal ideation, would recommend an patient observation.   Recommend inpatient observation  Total Time spent with patient: 30 minutes  Musculoskeletal  Strength & Muscle Tone: within normal limits Gait & Station: normal Patient leans: N/A  Psychiatric Specialty Exam  Presentation General Appearance:  Casual  Eye Contact: Good  Speech: Clear and Coherent  Speech Volume: Normal  Handedness: Right   Mood and Affect  Mood: Anxious; Angry  Affect: Appropriate   Thought Process  Thought Processes: Coherent  Descriptions of Associations:Circumstantial  Orientation:Full (Time, Place and Person)  Thought Content:WDL    Hallucinations:Hallucinations: None  Ideas of Reference:None  Suicidal Thoughts:Suicidal Thoughts: Yes, Passive  Homicidal Thoughts:Homicidal Thoughts: No   Sensorium  Memory: Immediate Fair  Judgment: Poor  Insight: Fair   Chartered certified accountant: Fair  Attention Span: Fair  Recall: [employer] of Knowledge: Fair  Language: Fair   Psychomotor Activity  Psychomotor Activity: Psychomotor Activity: Normal   Assets  Assets: Desire for Improvement   Sleep  Sleep: Sleep: Poor Number of Hours of Sleep: 4   No data recorded  Physical Exam HENT:     Head: Normocephalic.     Nose: Nose normal.  Cardiovascular:  Rate and Rhythm: Normal rate.  Pulmonary:     Effort: Pulmonary effort is normal.  Musculoskeletal:     Cervical back: Normal range of motion.  Neurological:     General: No focal deficit present.     Mental Status: He is alert.  Psychiatric:        Mood and Affect: Mood normal.        Behavior: Behavior normal.         Thought Content: Thought content normal.        Judgment: Judgment normal.    Review of Systems  Constitutional: Negative.   HENT: Negative.    Eyes: Negative.   Respiratory: Negative.    Cardiovascular: Negative.   Gastrointestinal: Negative.   Genitourinary: Negative.   Musculoskeletal: Negative.   Skin: Negative.   Neurological: Negative.   Psychiatric/Behavioral:  Positive for substance abuse and suicidal ideas. The patient is nervous/anxious.     Blood pressure 120/83, pulse 80, temperature 98.5 F (36.9 C), temperature source Oral, resp. rate 17, SpO2 100 %. There is no height or weight on file to calculate BMI.  Past Psychiatric History: cocaine abuse   Is the patient at risk to self? Yes  Has the patient been a risk to self in the past 6 months? Yes .    Has the patient been a risk to self within the distant past? No   Is the patient a risk to others? No   Has the patient been a risk to others in the past 6 months? No   Has the patient been a risk to others within the distant past? No   Past Medical History: see chart  Family History: unknow  Social History: cocaine and tobacco use  Last Labs:  Admission on 03/01/2023  Component Date Value Ref Range Status   POC Amphetamine UR 03/02/2023 None Detected  NONE DETECTED (Cut Off Level 1000 ng/mL) Final   POC Secobarbital (BAR) 03/02/2023 None Detected  NONE DETECTED (Cut Off Level 300 ng/mL) Final   POC Buprenorphine (BUP) 03/02/2023 None Detected  NONE DETECTED (Cut Off Level 10 ng/mL) Final   POC Oxazepam (BZO) 03/02/2023 None Detected  NONE DETECTED (Cut Off Level 300 ng/mL) Final   POC Cocaine UR 03/02/2023 Positive (A)  NONE DETECTED (Cut Off Level 300 ng/mL) Final   POC Methamphetamine UR 03/02/2023 None Detected  NONE DETECTED (Cut Off Level 1000 ng/mL) Final   POC Morphine 03/02/2023 None Detected  NONE DETECTED (Cut Off Level 300 ng/mL) Final   POC Methadone UR 03/02/2023 None Detected  NONE DETECTED (Cut  Off Level 300 ng/mL) Final   POC Oxycodone UR 03/02/2023 None Detected  NONE DETECTED (Cut Off Level 100 ng/mL) Final   POC Marijuana UR 03/02/2023 Positive (A)  NONE DETECTED (Cut Off Level 50 ng/mL) Final  Admission on 02/05/2023, Discharged on 02/05/2023  Component Date Value Ref Range Status   Glucose-Capillary 02/05/2023 380 (H)  70 - 99 mg/dL Final   Glucose reference range applies only to samples taken after fasting for at least 8 hours.   Sodium 02/05/2023 138  135 - 145 mmol/L Final   Potassium 02/05/2023 4.0  3.5 - 5.1 mmol/L Final   Chloride 02/05/2023 103  98 - 111 mmol/L Final   CO2 02/05/2023 26  22 - 32 mmol/L Final   Glucose, Bld 02/05/2023 380 (H)  70 - 99 mg/dL Final   Glucose reference range applies only to samples taken after fasting for at least 8 hours.  BUN 02/05/2023 11  6 - 20 mg/dL Final   Creatinine, Ser 02/05/2023 0.92  0.61 - 1.24 mg/dL Final   Calcium 16/06/9603 9.1  8.9 - 10.3 mg/dL Final   GFR, Estimated 02/05/2023 >60  >60 mL/min Final   Comment: (NOTE) Calculated using the CKD-EPI Creatinine Equation (2021)    Anion gap 02/05/2023 9  5 - 15 Final   Performed at [facility], [address] [place], [place] [address]   WBC 02/05/2023 7.2  4.0 - 10.5 K/uL Final   RBC 02/05/2023 5.40  4.22 - 5.81 MIL/uL Final   Hemoglobin 02/05/2023 16.0  13.0 - 17.0 g/dL Final   HCT 11/91/4782 49.7  39.0 - 52.0 % Final   MCV 02/05/2023 92.0  80.0 - 100.0 fL Final   MCH 02/05/2023 29.6  26.0 - 34.0 pg Final   MCHC 02/05/2023 32.2  30.0 - 36.0 g/dL Final   RDW 95/62/1308 13.9  11.5 - 15.5 % Final   Platelets 02/05/2023 266  150 - 400 K/uL Final   nRBC 02/05/2023 0.0  0.0 - 0.2 % Final   Performed at [facility], [address], [place], [place] [address]   Glucose-Capillary 02/05/2023 342 (H)  70 - 99 mg/dL Final   Glucose reference range applies only to samples taken after fasting for at least 8 hours.   Total Protein 02/05/2023 7.5  6.5 - 8.1 g/dL  Final   Albumin 69/62/9528 4.1  3.5 - 5.0 g/dL Final   AST 41/32/4401 16  15 - 41 U/L Final   ALT 02/05/2023 12  0 - 44 U/L Final   Alkaline Phosphatase 02/05/2023 83  38 - 126 U/L Final   Total Bilirubin 02/05/2023 1.2  0.3 - 1.2 mg/dL Final   Bilirubin, Direct 02/05/2023 0.3 (H)  0.0 - 0.2 mg/dL Final   Indirect Bilirubin 02/05/2023 0.9  0.3 - 0.9 mg/dL Final   Performed at [facility], [address] [place], [place] [address]   Lipase 02/05/2023 36  11 - 51 U/L Final   Performed at [facility], [address], [place], [place] [address]   pH, Ven 02/05/2023 7.352  7.25 - 7.43 Final   pCO2, Ven 02/05/2023 48.6  44 - 60 mmHg Final   pO2, Ven 02/05/2023 28 (LL)  32 - 45 mmHg Final   Bicarbonate 02/05/2023 26.9  20.0 - 28.0 mmol/L Final   TCO2 02/05/2023 28  22 - 32 mmol/L Final   O2 Saturation 02/05/2023 49  % Final   Acid-Base Excess 02/05/2023 0.0  0.0 - 2.0 mmol/L Final   Sodium 02/05/2023 140  135 - 145 mmol/L Final   Potassium 02/05/2023 3.9  3.5 - 5.1 mmol/L Final   Calcium, Ion 02/05/2023 1.22  1.15 - 1.40 mmol/L Final   HCT 02/05/2023 50.0  39.0 - 52.0 % Final   Hemoglobin 02/05/2023 17.0  13.0 - 17.0 g/dL Final   Sample type 03/47/4259 VENOUS   Final   Comment 02/05/2023 NOTIFIED PHYSICIAN   Final   Beta-Hydroxybutyric Acid 02/05/2023 0.21  0.05 - 0.27 mmol/L Final   Performed at [facility], [address] [place], [place] [address]   Glucose-Capillary 02/05/2023 213 (H)  70 - 99 mg/dL Final   Glucose reference range applies only to samples taken after fasting for at least 8 hours.  Admission on 01/27/2023, Discharged on 01/27/2023  Component Date Value Ref Range Status   Glucose-Capillary 01/27/2023 454 (H)  70 - 99 mg/dL Final   Glucose reference range applies only to samples taken after fasting for at least 8 hours.   WBC 01/27/2023 5.3  4.0 - 10.5 K/uL Final   RBC 01/27/2023 4.77  4.22 - 5.81 MIL/uL Final   Hemoglobin 01/27/2023 14.2  13.0 -  17.0 g/dL Final   HCT 95/28/4132 43.5  39.0 - 52.0 % Final   MCV 01/27/2023 91.2  80.0 - 100.0 fL Final   MCH 01/27/2023 29.8  26.0 - 34.0 pg Final   MCHC 01/27/2023 32.6  30.0 - 36.0 g/dL Final   RDW 44/09/270 13.9  11.5 - 15.5 % Final   Platelets 01/27/2023 238  150 - 400 K/uL Final   nRBC 01/27/2023 0.0  0.0 - 0.2 % Final   Neutrophils Relative % 01/27/2023 68  % Final   Neutro Abs 01/27/2023 3.5  1.7 - 7.7 K/uL Final   Lymphocytes Relative 01/27/2023 23  % Final   Lymphs Abs 01/27/2023 1.2  0.7 - 4.0 K/uL Final   Monocytes Relative 01/27/2023 9  % Final   Monocytes Absolute 01/27/2023 0.5  0.1 - 1.0 K/uL Final   Eosinophils Relative 01/27/2023 0  % Final   Eosinophils Absolute 01/27/2023 0.0  0.0 - 0.5 K/uL Final   Basophils Relative 01/27/2023 0  % Final   Basophils Absolute 01/27/2023 0.0  0.0 - 0.1 K/uL Final   Immature Granulocytes 01/27/2023 0  % Final   Abs Immature Granulocytes 01/27/2023 0.02  0.00 - 0.07 K/uL Final   Performed at [facility], [address] [place], [place] [address]   Sodium 01/27/2023 133 (L)  135 - 145 mmol/L Final   Potassium 01/27/2023 4.5  3.5 - 5.1 mmol/L Final   Chloride 01/27/2023 99  98 - 111 mmol/L Final   CO2 01/27/2023 25  22 - 32 mmol/L Final   Glucose, Bld 01/27/2023 410 (H)  70 - 99 mg/dL Final   Glucose reference range applies only to samples taken after fasting for at least 8 hours.   BUN 01/27/2023 17  6 - 20 mg/dL Final   Creatinine, Ser 01/27/2023 0.89  0.61 - 1.24 mg/dL Final   Calcium 40/34/7425 8.5 (L)  8.9 - 10.3 mg/dL Final   Total Protein 95/63/8756 6.8  6.5 - 8.1 g/dL Final   Albumin 43/32/9518 3.6  3.5 - 5.0 g/dL Final   AST 84/16/6063 19  15 - 41 U/L Final   ALT 01/27/2023 15  0 - 44 U/L Final   Alkaline Phosphatase 01/27/2023 60  38 - 126 U/L Final   Total Bilirubin 01/27/2023 0.7  0.3 - 1.2 mg/dL Final   GFR, Estimated 01/27/2023 >60  >60 mL/min Final   Comment: (NOTE) Calculated using the  CKD-EPI Creatinine Equation (2021)    Anion gap 01/27/2023 9  5 - 15 Final   Performed at [facility], [address] [place], [place] [address]   Glucose-Capillary 01/27/2023 339 (H)  70 - 99 mg/dL Final   Glucose reference range applies only to samples taken after fasting for at least 8 hours.  Admission on 12/23/2022, Discharged on 12/23/2022  Component Date Value Ref Range Status   Glucose-Capillary 12/23/2022 339 (H)  70 - 99 mg/dL Final   Glucose reference range applies only to samples taken after fasting for at least 8 hours.   Comment 1 12/23/2022 Notify RN   Final   Sodium 12/23/2022 132 (L)  135 - 145 mmol/L Final  Potassium 12/23/2022 4.0  3.5 - 5.1 mmol/L Final   Chloride 12/23/2022 102  98 - 111 mmol/L Final   CO2 12/23/2022 26  22 - 32 mmol/L Final   Glucose, Bld 12/23/2022 351 (H)  70 - 99 mg/dL Final   Glucose reference range applies only to samples taken after fasting for at least 8 hours.   BUN 12/23/2022 13  6 - 20 mg/dL Final   Creatinine, Ser 12/23/2022 0.70  0.61 - 1.24 mg/dL Final   Calcium 40/98/1191 8.3 (L)  8.9 - 10.3 mg/dL Final   GFR, Estimated 12/23/2022 >60  >60 mL/min Final   Comment: (NOTE) Calculated using the CKD-EPI Creatinine Equation (2021)    Anion gap 12/23/2022 4 (L)  5 - 15 Final   Performed at [facility], [address] [place], [place] [address]   WBC 12/23/2022 6.9  4.0 - 10.5 K/uL Final   RBC 12/23/2022 4.60  4.22 - 5.81 MIL/uL Final   Hemoglobin 12/23/2022 13.6  13.0 - 17.0 g/dL Final   HCT 56/21/3086 42.7  39.0 - 52.0 % Final   MCV 12/23/2022 92.8  80.0 - 100.0 fL Final   MCH 12/23/2022 29.6  26.0 - 34.0 pg Final   MCHC 12/23/2022 31.9  30.0 - 36.0 g/dL Final   RDW 57/84/6962 13.4  11.5 - 15.5 % Final   Platelets 12/23/2022 268  150 - 400 K/uL Final   nRBC 12/23/2022 0.0  0.0 - 0.2 % Final   Performed at [facility], [address], [place], [place] [address]   Color,  Urine 12/23/2022 YELLOW  YELLOW Final   APPearance 12/23/2022 CLEAR  CLEAR Final   Specific Gravity, Urine 12/23/2022 1.026  1.005 - 1.030 Final   pH 12/23/2022 7.0  5.0 - 8.0 Final   Glucose, UA 12/23/2022 >=500 (A)  NEGATIVE mg/dL Final   Hgb urine dipstick 12/23/2022 NEGATIVE  NEGATIVE Final   Bilirubin Urine 12/23/2022 NEGATIVE  NEGATIVE Final   Ketones, ur 12/23/2022 NEGATIVE  NEGATIVE mg/dL Final   Protein, ur 13/24/4010 NEGATIVE  NEGATIVE mg/dL Final   Nitrite 27/25/3664 NEGATIVE  NEGATIVE Final   Leukocytes,Ua 12/23/2022 NEGATIVE  NEGATIVE Final   RBC / HPF 12/23/2022 0-5  0 - 5 RBC/hpf Final   WBC, UA 12/23/2022 0-5  0 - 5 WBC/hpf Final   Bacteria, UA 12/23/2022 NONE SEEN  NONE SEEN Final   Squamous Epithelial / HPF 12/23/2022 0-5  0 - 5 /HPF Final   Performed at [facility], [address], [place], [place] [address]   Glucose-Capillary 12/23/2022 295 (H)  70 - 99 mg/dL Final   Glucose reference range applies only to samples taken after fasting for at least 8 hours.   Glucose-Capillary 12/23/2022 231 (H)  70 - 99 mg/dL Final   Glucose reference range applies only to samples taken after fasting for at least 8 hours.  Office Visit on 11/30/2022  Component Date Value Ref Range Status   Creatinine, Urine 11/30/2022 108.2  Not Estab. mg/dL Final   Microalbumin, Urine 11/30/2022 12.1  Not Estab. ug/mL Final   Microalb/Creat Ratio 11/30/2022 11  0 - 29 mg/g creat Final   Comment:                        Normal:                0 -  29  Moderately increased: 30 - 300                        Severely increased:       >300    Glucose 11/30/2022 49 (L)  70 - 99 mg/dL Final   BUN 09/81/1914 11  6 - 24 mg/dL Final   Creatinine, Ser 11/30/2022 0.77  0.76 - 1.27 mg/dL Final   eGFR 78/29/5621 108  >59 mL/min/1.73 Final   BUN/Creatinine Ratio 11/30/2022 14  9 - 20 Final   Sodium 11/30/2022 144  134 - 144 mmol/L Final   Potassium 11/30/2022 5.2  3.5 -  5.2 mmol/L Final   Chloride 11/30/2022 104  96 - 106 mmol/L Final   CO2 11/30/2022 26  20 - 29 mmol/L Final   Calcium 11/30/2022 9.3  8.7 - 10.2 mg/dL Final   Total Protein 30/86/5784 7.0  6.0 - 8.5 g/dL Final   Albumin 69/62/9528 4.2  3.8 - 4.9 g/dL Final   Globulin, Total 11/30/2022 2.8  1.5 - 4.5 g/dL Final   Albumin/Globulin Ratio 11/30/2022 1.5  1.2 - 2.2 Final   Bilirubin Total 11/30/2022 0.2  0.0 - 1.2 mg/dL Final   Alkaline Phosphatase 11/30/2022 66  44 - 121 IU/L Final   AST 11/30/2022 16  0 - 40 IU/L Final   ALT 11/30/2022 13  0 - 44 IU/L Final   Hemoglobin A1C 11/30/2022 9.0 (A)  4.0 - 5.6 % Final   POC Glucose 11/30/2022 60 (A)  70 - 99 mg/dl Final   WBC 41/32/4401 7.7  3.4 - 10.8 x10E3/uL Final   RBC 11/30/2022 4.94  4.14 - 5.80 x10E6/uL Final   Hemoglobin 11/30/2022 14.6  13.0 - 17.7 g/dL Final   Hematocrit 02/72/5366 45.2  37.5 - 51.0 % Final   MCV 11/30/2022 92  79 - 97 fL Final   MCH 11/30/2022 29.6  26.6 - 33.0 pg Final   MCHC 11/30/2022 32.3  31.5 - 35.7 g/dL Final   RDW 44/11/4740 12.7  11.6 - 15.4 % Final   Platelets 11/30/2022 269  150 - 450 x10E3/uL Final   Neutrophils 11/30/2022 61  Not Estab. % Final   Lymphs 11/30/2022 28  Not Estab. % Final   Monocytes 11/30/2022 10  Not Estab. % Final   Eos 11/30/2022 1  Not Estab. % Final   Basos 11/30/2022 0  Not Estab. % Final   Neutrophils Absolute 11/30/2022 4.6  1.4 - 7.0 x10E3/uL Final   Lymphocytes Absolute 11/30/2022 2.2  0.7 - 3.1 x10E3/uL Final   Monocytes Absolute 11/30/2022 0.8  0.1 - 0.9 x10E3/uL Final   EOS (ABSOLUTE) 11/30/2022 0.1  0.0 - 0.4 x10E3/uL Final   Basophils Absolute 11/30/2022 0.0  0.0 - 0.2 x10E3/uL Final   Immature Granulocytes 11/30/2022 0  Not Estab. % Final   Immature Grans (Abs) 11/30/2022 0.0  0.0 - 0.1 x10E3/uL Final   Cholesterol, Total 11/30/2022 112  100 - 199 mg/dL Final   Triglycerides 59/56/3875 82  0 - 149 mg/dL Final   HDL 64/33/2951 52  >39 mg/dL Final   VLDL Cholesterol  Cal 11/30/2022 16  5 - 40 mg/dL Final   LDL Chol Calc (NIH) 11/30/2022 44  0 - 99 mg/dL Final   Chol/HDL Ratio 11/30/2022 2.2  0.0 - 5.0 ratio Final   Comment:  T. Chol/HDL Ratio                                             Men  Women                               1/2 Avg.Risk  3.4    3.3                                   Avg.Risk  5.0    4.4                                2X Avg.Risk  9.6    7.1                                3X Avg.Risk 23.4   11.0   Admission on 10/28/2022, Discharged on 10/28/2022  Component Date Value Ref Range Status   WBC 10/28/2022 5.9  4.0 - 10.5 K/uL Final   RBC 10/28/2022 4.74  4.22 - 5.81 MIL/uL Final   Hemoglobin 10/28/2022 14.2  13.0 - 17.0 g/dL Final   HCT 34/74/2595 44.3  39.0 - 52.0 % Final   MCV 10/28/2022 93.5  80.0 - 100.0 fL Final   MCH 10/28/2022 30.0  26.0 - 34.0 pg Final   MCHC 10/28/2022 32.1  30.0 - 36.0 g/dL Final   RDW 63/87/5643 13.9  11.5 - 15.5 % Final   Platelets 10/28/2022 244  150 - 400 K/uL Final   nRBC 10/28/2022 0.0  0.0 - 0.2 % Final   Neutrophils Relative % 10/28/2022 72  % Final   Neutro Abs 10/28/2022 4.3  1.7 - 7.7 K/uL Final   Lymphocytes Relative 10/28/2022 19  % Final   Lymphs Abs 10/28/2022 1.1  0.7 - 4.0 K/uL Final   Monocytes Relative 10/28/2022 8  % Final   Monocytes Absolute 10/28/2022 0.5  0.1 - 1.0 K/uL Final   Eosinophils Relative 10/28/2022 1  % Final   Eosinophils Absolute 10/28/2022 0.0  0.0 - 0.5 K/uL Final   Basophils Relative 10/28/2022 0  % Final   Basophils Absolute 10/28/2022 0.0  0.0 - 0.1 K/uL Final   Immature Granulocytes 10/28/2022 0  % Final   Abs Immature Granulocytes 10/28/2022 0.01  0.00 - 0.07 K/uL Final   Performed at [facility], [address] [place], [place] [address]   Lipase 10/28/2022 137 (H)  11 - 51 U/L Final   Performed at [facility], [address] [place], [place] [address]   Opiates 10/28/2022 NONE DETECTED  NONE DETECTED Final    Cocaine 10/28/2022 POSITIVE (A)  NONE DETECTED Final   Benzodiazepines 10/28/2022 NONE DETECTED  NONE DETECTED Final   Amphetamines 10/28/2022 NONE DETECTED  NONE DETECTED Final   Tetrahydrocannabinol 10/28/2022 NONE DETECTED  NONE DETECTED Final   Barbiturates 10/28/2022 NONE DETECTED  NONE DETECTED Final   Comment: (NOTE) DRUG SCREEN FOR MEDICAL PURPOSES ONLY.  IF CONFIRMATION IS NEEDED FOR ANY PURPOSE, NOTIFY LAB WITHIN 5 DAYS.  LOWEST DETECTABLE LIMITS FOR URINE DRUG SCREEN Drug Class  Cutoff (ng/mL) Amphetamine and metabolites    1000 Barbiturate and metabolites    200 Benzodiazepine                 200 Opiates and metabolites        300 Cocaine and metabolites        300 THC                            50 Performed at [facility], [address], [place], [place] [address]    Alcohol, Ethyl (B) 10/28/2022 <10  <10 mg/dL Final   Comment: (NOTE) Lowest detectable limit for serum alcohol is 10 mg/dL.  For medical purposes only. Performed at [facility], [address], [place], [place] [address]    Sodium 10/28/2022 135  135 - 145 mmol/L Final   Potassium 10/28/2022 4.0  3.5 - 5.1 mmol/L Final   Chloride 10/28/2022 99  98 - 111 mmol/L Final   CO2 10/28/2022 23  22 - 32 mmol/L Final   Glucose, Bld 10/28/2022 273 (H)  70 - 99 mg/dL Final   Glucose reference range applies only to samples taken after fasting for at least 8 hours.   BUN 10/28/2022 7  6 - 20 mg/dL Final   Creatinine, Ser 10/28/2022 0.69  0.61 - 1.24 mg/dL Final   Calcium 91/47/8295 9.0  8.9 - 10.3 mg/dL Final   Total Protein 62/13/0865 7.2  6.5 - 8.1 g/dL Final   Albumin 78/46/9629 3.8  3.5 - 5.0 g/dL Final   AST 52/84/1324 22  15 - 41 U/L Final   ALT 10/28/2022 17  0 - 44 U/L Final   Alkaline Phosphatase 10/28/2022 65  38 - 126 U/L Final   Total Bilirubin 10/28/2022 0.3  0.3 - 1.2 mg/dL Final   GFR, Estimated 10/28/2022 >60  >60 mL/min Final   Comment: (NOTE) Calculated  using the CKD-EPI Creatinine Equation (2021)    Anion gap 10/28/2022 13  5 - 15 Final   Performed at [facility], [address], [place], [place] [address]   Salicylate Lvl 10/28/2022 <7.0 (L)  7.0 - 30.0 mg/dL Final   Performed at [facility], [address] [place], [place] [address]   Acetaminophen (Tylenol), Serum 10/28/2022 <10 (L)  10 - 30 ug/mL Final   Comment: (NOTE) Therapeutic concentrations vary significantly. A range of 10-30 ug/mL  may be an effective concentration for many patients. However, some  are best treated at concentrations outside of this range. Acetaminophen concentrations >150 ug/mL at 4 hours after ingestion  and >50 ug/mL at 12 hours after ingestion are often associated with  toxic reactions.  Performed at [facility], [address], [place], [place] [address]   Admission on 10/10/2022, Discharged on 10/10/2022  Component Date Value Ref Range Status   Glucose-Capillary 10/10/2022 >600 (HH)  70 - 99 mg/dL Final   Glucose reference range applies only to samples taken after fasting for at least 8 hours.   Sodium 10/10/2022 129 (L)  135 - 145 mmol/L Final   Potassium 10/10/2022 4.4  3.5 - 5.1 mmol/L Final   Chloride 10/10/2022 95 (L)  98 - 111 mmol/L Final   CO2 10/10/2022 24  22 - 32 mmol/L Final   Glucose, Bld 10/10/2022 683 (HH)  70 - 99 mg/dL Final   Comment: CRITICAL RESULT CALLED TO, READ BACK BY AND VERIFIED WITH LAMB,S.  RN AT 1219 10/10/22 MULLINS,T Glucose reference range applies only to samples taken after fasting for at least 8 hours.    BUN 10/10/2022 16  6 - 20 mg/dL Final   Creatinine, Ser 10/10/2022 0.93  0.61 - 1.24 mg/dL Final   Calcium 40/98/1191 8.4 (L)  8.9 - 10.3 mg/dL Final   Total Protein 47/82/9562 6.9  6.5 - 8.1 g/dL Final   Albumin 13/04/6577 3.6  3.5 - 5.0 g/dL Final   AST 46/96/2952 16  15 - 41 U/L Final   ALT 10/10/2022 11  0 - 44 U/L Final   Alkaline Phosphatase 10/10/2022 61  38 - 126 U/L Final    Total Bilirubin 10/10/2022 0.7  0.3 - 1.2 mg/dL Final   GFR, Estimated 10/10/2022 >60  >60 mL/min Final   Comment: (NOTE) Calculated using the CKD-EPI Creatinine Equation (2021)    Anion gap 10/10/2022 10  5 - 15 Final   Performed at [facility], [address], [place], [place] [address]   Color, Urine 10/10/2022 STRAW (A)  YELLOW Final   APPearance 10/10/2022 CLEAR  CLEAR Final   Specific Gravity, Urine 10/10/2022 1.031 (H)  1.005 - 1.030 Final   pH 10/10/2022 5.0  5.0 - 8.0 Final   Glucose, UA 10/10/2022 >=500 (A)  NEGATIVE mg/dL Final   Hgb urine dipstick 10/10/2022 NEGATIVE  NEGATIVE Final   Bilirubin Urine 10/10/2022 NEGATIVE  NEGATIVE Final   Ketones, ur 10/10/2022 NEGATIVE  NEGATIVE mg/dL Final   Protein, ur 44/09/270 NEGATIVE  NEGATIVE mg/dL Final   Nitrite 53/66/4403 NEGATIVE  NEGATIVE Final   Leukocytes,Ua 10/10/2022 NEGATIVE  NEGATIVE Final   RBC / HPF 10/10/2022 0-5  0 - 5 RBC/hpf Final   WBC, UA 10/10/2022 0-5  0 - 5 WBC/hpf Final   Bacteria, UA 10/10/2022 NONE SEEN  NONE SEEN Final   Squamous Epithelial / HPF 10/10/2022 0-5  0 - 5 /HPF Final   Performed at [facility], [address], [place], [place] [address]   Beta-Hydroxybutyric Acid 10/10/2022 0.41 (H)  0.05 - 0.27 mmol/L Final   Performed at [facility], [address] [place], [place] [address]   pH, Ven 10/10/2022 7.39  7.25 - 7.43 Final   pCO2, Ven 10/10/2022 42 (L)  44 - 60 mmHg Final   pO2, Ven 10/10/2022 59 (H)  32 - 45 mmHg Final   Bicarbonate 10/10/2022 25.4  20.0 - 28.0 mmol/L Final   Acid-Base Excess 10/10/2022 0.3  0.0 - 2.0 mmol/L Final   O2 Saturation 10/10/2022 92.1  % Final   Patient temperature 10/10/2022 37.0   Final   Performed at [facility], [address], [place], [place] [address]   Osmolality 10/10/2022 315 (H)  275 - 295 mOsm/kg Final   Comment: REPEATED TO VERIFY Performed at [facility], [address] [place], [place] [address]    WBC 10/10/2022 5.3  4.0 - 10.5 K/uL Final   RBC 10/10/2022 4.89  4.22 - 5.81 MIL/uL Final   Hemoglobin 10/10/2022 14.7  13.0 - 17.0 g/dL Final   HCT 88/41/6606 44.6  39.0 - 52.0 % Final   MCV 10/10/2022 91.2  80.0 - 100.0 fL Final   MCH 10/10/2022 30.1  26.0 - 34.0 pg Final   MCHC 10/10/2022 33.0  30.0 - 36.0 g/dL Final   RDW 30/16/0109 13.1  11.5 - 15.5 % Final   Platelets 10/10/2022 235  150 - 400 K/uL Final  nRBC 10/10/2022 0.0  0.0 - 0.2 % Final   Performed at [facility], [address], [place], [place] [address]   Glucose-Capillary 10/10/2022 480 (H)  70 - 99 mg/dL Final   Glucose reference range applies only to samples taken after fasting for at least 8 hours.    Allergies: Patient has no known allergies.  Medications:  Facility Ordered Medications  Medication   acetaminophen (TYLENOL) tablet 650 mg   alum & mag hydroxide-simeth (MAALOX/MYLANTA) 200-200-20 MG/5ML suspension 30 mL   magnesium hydroxide (MILK OF MAGNESIA) suspension 30 mL   OLANZapine zydis (ZYPREXA) disintegrating tablet 10 mg   And   LORazepam (ATIVAN) tablet 1 mg   And   ziprasidone (GEODON) injection 20 mg   atorvastatin (LIPITOR) tablet 10 mg   gabapentin (NEURONTIN) capsule 300 mg   glipiZIDE (GLUCOTROL) tablet 5 mg   insulin aspart (novoLOG) injection 30 Units   PTA Medications  Medication Sig   sildenafil (VIAGRA) 100 MG tablet Take 1 tablet (100 mg total) by mouth daily as needed for erectile dysfunction. (Patient not taking: Reported on 06/13/2022)   Insulin Syringe-Needle U-100 30G X 5/16" 0.3 ML MISC Inject 30 Units into the skin 2 (two) times daily.   atorvastatin (LIPITOR) 10 MG tablet TAKE 1 TABLET (10 MG TOTAL) BY MOUTH DAILY.   Insulin Pen Needle (PEN NEEDLES) 31G X 6 MM MISC Use as directed 3 (three) times daily.   glipiZIDE (GLUCOTROL) 5 MG tablet Take 1 tablet (5 mg total) by mouth daily before breakfast.   Insulin  Glargine (BASAGLAR KWIKPEN) 100 UNIT/ML Inject 15 Units into the skin at bedtime.   insulin lispro (HUMALOG KWIKPEN) 100 UNIT/ML KwikPen Inject 30 Units into the skin 2 (two) times daily. With 2 meals, hold if you are skipping a meal   glucose blood (ACCU-CHEK GUIDE) test strip Use to check blood glucose three times daily.   Blood Glucose Monitoring Suppl (ACCU-CHEK GUIDE) w/Device KIT Use to check blood glucose three times daily.   Accu-Chek Softclix Lancets lancets Use to check blood pressure three times daily.   gabapentin (NEURONTIN) 300 MG capsule Take 1 capsule (300 mg total) by mouth 2 (two) times daily.   HYDROcodone-acetaminophen (NORCO/VICODIN) 5-325 MG tablet Take 1 tablet by mouth every 6 (six) hours as needed not relieved by Tylenol or Motrin.      Medical Decision Making  Inpatient observation  Meds ordered this encounter  Medications   acetaminophen (TYLENOL) tablet 650 mg   alum & mag hydroxide-simeth (MAALOX/MYLANTA) 200-200-20 MG/5ML suspension 30 mL   magnesium hydroxide (MILK OF MAGNESIA) suspension 30 mL   AND Linked Order Group    OLANZapine zydis (ZYPREXA) disintegrating tablet 10 mg    LORazepam (ATIVAN) tablet 1 mg    ziprasidone (GEODON) injection 20 mg   atorvastatin (LIPITOR) tablet 10 mg   gabapentin (NEURONTIN) capsule 300 mg   glipiZIDE (GLUCOTROL) tablet 5 mg   DISCONTD: insulin lispro (HUMALOG) KwikPen 30 Units   DISCONTD: Pen Needles MISC 1 pen    insulin aspart (novoLOG) injection 30 Units    Lab Orders         CBC with Differential/Platelet         Comprehensive metabolic panel         Hemoglobin A1c         Ethanol         TSH         POCT Urine Drug Screen - (I-Screen)  Recommendations  Based on my evaluation the patient appears to have an emergency medical condition for which I recommend the patient be transferred to the emergency department for further evaluation.  Sindy Guadeloupe, NP 03/02/23  5:29 AM

## 2023-03-02 ENCOUNTER — Encounter (HOSPITAL_COMMUNITY): Payer: Self-pay | Admitting: Emergency Medicine

## 2023-03-02 DIAGNOSIS — E119 Type 2 diabetes mellitus without complications: Secondary | ICD-10-CM | POA: Diagnosis not present

## 2023-03-02 DIAGNOSIS — R45851 Suicidal ideations: Secondary | ICD-10-CM | POA: Diagnosis not present

## 2023-03-02 DIAGNOSIS — Z560 Unemployment, unspecified: Secondary | ICD-10-CM | POA: Diagnosis not present

## 2023-03-02 DIAGNOSIS — F1410 Cocaine abuse, uncomplicated: Secondary | ICD-10-CM | POA: Diagnosis not present

## 2023-03-02 DIAGNOSIS — Z5900 Homelessness unspecified: Secondary | ICD-10-CM | POA: Diagnosis not present

## 2023-03-02 LAB — CBC WITH DIFFERENTIAL/PLATELET
Abs Immature Granulocytes: 0.04 10*3/uL (ref 0.00–0.07)
Basophils Absolute: 0 10*3/uL (ref 0.0–0.1)
Basophils Relative: 1 %
Eosinophils Absolute: 0.1 10*3/uL (ref 0.0–0.5)
Eosinophils Relative: 2 %
HCT: 43.7 % (ref 39.0–52.0)
Hemoglobin: 13.7 g/dL (ref 13.0–17.0)
Immature Granulocytes: 1 %
Lymphocytes Relative: 32 %
Lymphs Abs: 1.8 10*3/uL (ref 0.7–4.0)
MCH: 29.1 pg (ref 26.0–34.0)
MCHC: 31.4 g/dL (ref 30.0–36.0)
MCV: 92.8 fL (ref 80.0–100.0)
Monocytes Absolute: 0.6 10*3/uL (ref 0.1–1.0)
Monocytes Relative: 11 %
Neutro Abs: 3.1 10*3/uL (ref 1.7–7.7)
Neutrophils Relative %: 53 %
Platelets: 256 10*3/uL (ref 150–400)
RBC: 4.71 MIL/uL (ref 4.22–5.81)
RDW: 14.1 % (ref 11.5–15.5)
WBC: 5.7 10*3/uL (ref 4.0–10.5)
nRBC: 0 % (ref 0.0–0.2)

## 2023-03-02 LAB — GLUCOSE, CAPILLARY
Glucose-Capillary: 499 mg/dL — ABNORMAL HIGH (ref 70–99)
Glucose-Capillary: 404 mg/dL — ABNORMAL HIGH (ref 70–99)
Glucose-Capillary: 214 mg/dL — ABNORMAL HIGH (ref 70–99)

## 2023-03-02 LAB — COMPREHENSIVE METABOLIC PANEL
ALT: 16 U/L (ref 0–44)
AST: 13 U/L — ABNORMAL LOW (ref 15–41)
Albumin: 3.6 g/dL (ref 3.5–5.0)
Alkaline Phosphatase: 65 U/L (ref 38–126)
Anion gap: 10 (ref 5–15)
BUN: 14 mg/dL (ref 6–20)
CO2: 27 mmol/L (ref 22–32)
Calcium: 9.3 mg/dL (ref 8.9–10.3)
Chloride: 100 mmol/L (ref 98–111)
Creatinine, Ser: 0.9 mg/dL (ref 0.61–1.24)
GFR, Estimated: >60 mL/min (ref >60)
Glucose, Bld: 424 mg/dL — ABNORMAL HIGH (ref 70–99)
Potassium: 5 mmol/L (ref 3.5–5.1)
Sodium: 137 mmol/L (ref 135–145)
Total Bilirubin: 0.5 mg/dL (ref 0.3–1.2)
Total Protein: 6.5 g/dL (ref 6.5–8.1)

## 2023-03-02 LAB — POCT URINE DRUG SCREEN - MANUAL ENTRY (I-SCREEN)
POC Amphetamine UR: NOT DETECTED
POC Buprenorphine (BUP): NOT DETECTED
POC Cocaine UR: POSITIVE — AB
POC Marijuana UR: POSITIVE — AB
POC Methadone UR: NOT DETECTED
POC Methamphetamine UR: NOT DETECTED
POC Morphine: NOT DETECTED
POC Oxazepam (BZO): NOT DETECTED
POC Oxycodone UR: NOT DETECTED
POC Secobarbital (BAR): NOT DETECTED

## 2023-03-02 LAB — HEMOGLOBIN A1C
Hgb A1c MFr Bld: 10.9 % — ABNORMAL HIGH (ref 4.8–5.6)
Mean Plasma Glucose: 266.13 mg/dL

## 2023-03-02 LAB — TSH: TSH: 1.178 u[IU]/mL (ref 0.350–4.500)

## 2023-03-02 LAB — ETHANOL: Alcohol, Ethyl (B): <10 mg/dL (ref <10)

## 2023-03-02 MED ORDER — INSULIN GLARGINE-YFGN 100 UNIT/ML ~~LOC~~ SOLN
20.0000 [IU] | Freq: Every day | SUBCUTANEOUS | Status: DC
  Administered 2023-03-02 – 2023-03-03 (×2): 20 [IU] via SUBCUTANEOUS

## 2023-03-02 MED ORDER — INSULIN ASPART 100 UNIT/ML IJ SOLN
0.0000 [IU] | Freq: Three times a day (TID) | INTRAMUSCULAR | Status: DC
  Administered 2023-03-02: 15 [IU] via SUBCUTANEOUS
  Administered 2023-03-02 – 2023-03-03 (×3): 8 [IU] via SUBCUTANEOUS

## 2023-03-02 MED ORDER — INSULIN ASPART 100 UNIT/ML IJ SOLN: 30.0000 [IU] | Freq: Two times a day (BID) | INTRAMUSCULAR | Status: DC

## 2023-03-02 MED ORDER — TRAZODONE HCL 50 MG PO TABS
50.0000 mg | ORAL_TABLET | Freq: Every evening | ORAL | Status: DC | PRN
  Administered 2023-03-02: 50 mg via ORAL
  Filled 2023-03-02: qty 1

## 2023-03-02 MED ORDER — INSULIN ASPART 100 UNIT/ML IJ SOLN
10.0000 [IU] | Freq: Three times a day (TID) | INTRAMUSCULAR | Status: DC
  Administered 2023-03-02 – 2023-03-03 (×4): 10 [IU] via SUBCUTANEOUS

## 2023-03-02 MED ORDER — INSULIN ASPART 100 UNIT/ML IJ SOLN
0.0000 [IU] | Freq: Every day | INTRAMUSCULAR | Status: DC
  Administered 2023-03-02: 2 [IU] via SUBCUTANEOUS

## 2023-03-02 MED ORDER — HYDROXYZINE HCL 25 MG PO TABS: 25.0000 mg | ORAL_TABLET | Freq: Three times a day (TID) | ORAL | Status: DC | PRN

## 2023-03-02 NOTE — ED Notes (Signed)
Patient resting quietly in bed. Respirations equal and unlabored, skin warm and dry, NAD. Routine safety checks conducted according to facility protocol. Will continue to monitor for safety. 

## 2023-03-02 NOTE — ED Notes (Addendum)
Patient resting quietly in bed with eyes closed. Respirations equal and unlabored, skin warm and dry, NAD. Routine safety checks conducted according to facility protocol. Will continue to monitor for safety.  
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
## 2023-03-02 NOTE — ED Notes (Signed)
Patient A&Ox4. Patient denies SI/HI and HI. Patient denies any physical complaints when asked. No acute distress noted. Support and encouragement provided. Routine safety checks conducted according to facility protocol. Encouraged patient to notify staff if thoughts of harm toward self or others arise. Patient verbalize understanding and agreement. Will continue to monitor for safety.    

## 2023-03-02 NOTE — ED Notes (Signed)
  CBG 265  

## 2023-03-02 NOTE — ED Notes (Signed)
Pt is currently sleeping, no distress noted, environmental check complete, will continue to monitor patient for safety.  

## 2023-03-02 NOTE — Inpatient Diabetes Management (Signed)
Inpatient Diabetes Program Recommendations  AACE/ADA: New Consensus Statement on Inpatient Glycemic Control (2015)  Target Ranges:  Prepandial:   less than 140 mg/dL      Peak postprandial:   less than 180 mg/dL (1-2 hours)      Critically ill patients:  140 - 180 mg/dL   Lab Results  Component Value Date   GLUCAP 499 (H) 03/02/2023   HGBA1C 9.0 (A) 11/30/2022    Review of Glycemic Control  Latest Reference Range & Units 03/02/23 10:10  Glucose-Capillary 70 - 99 mg/dL 841 (H)  (H): Data is abnormally high Diabetes history: Type 2 DM Outpatient Diabetes medications: Novolog 30 units BID, Basaglar 20 units QHS Current orders for Inpatient glycemic control: none  Inpatient Diabetes Program Recommendations:    Consider adding:  -Semglee 20 units QD -Novolog 0-15 units TID & HS -Novolog 10 units TID (Assuming patient consuming >50% of meals) - If appropriate, carb modified diet?   Thanks, Lujean Rave, MSN, RNC-OB Diabetes Coordinator [phone] (8a-5p)

## 2023-03-02 NOTE — ED Notes (Signed)
Pt resting in bed calm and cooperative. No c/o pain or distress noted. Alert and orient x 4. Will continue to monitor for safety

## 2023-03-02 NOTE — ED Provider Notes (Cosign Needed Addendum)
Behavioral Health Progress Note  Date and Time: 03/02/2023 12:29 PM Name: Dakota Kim MRN:  [id number]  Subjective: Patient states "I need help, I am suicidal and I need help with cocaine."  Dakota Kim is reassessed by this nurse practitioner face-to-face.  He is reclined in observation area upon my approach, no apparent distress.  He is alert and oriented, pleasant and cooperative during assessment.  Chart reviewed and patient discussed with Dr. Nelly Rout on 03/02/2023.  Patient reports suicidal ideation worsening times several days.  Recent stressors include residing in his car and chronic substance use. Patient uses crack cocaine daily and has done so for approximately 3 years.  Patient sober for 17 years prior to the death of his father 3 years ago.  Aside from crack cocaine he intermittently uses opioid pain medication that he has not prescribed.  He endorses alcohol use, one 40 ounce beer an average of 3 times per week.  No history of delirium tremens, no history of alcohol-related seizure.  Patient endorses passive suicidal ideation at this time.  Upon arrival on yesterday patient endorsed suicidal plan to include overdose on medications and alcohol.  He is unable to contract verbally for safety at this time.  He endorses 1 previous suicide attempt approximately 30 years ago when he ingested an intentional overdose.  Dakota Kim denies homicidal ideations.  He denies auditory and visual hallucinations.  There is no evidence of delusional thought content no indication that patient is responding to internal stimuli.  He denies symptoms of paranoia.  Patient endorses history of depression.  He is not linked with outpatient psychiatry currently.  No current medications to address mood.  He reports previously hospitalized in [place] more than 20 years ago for inpatient psychiatric care.  No family mental health or addiction history reported.  Patient is currently homeless in [place], typically  resides in his car.  He is not employed at this time.  CBG elevated upon arrival, CBG measures 404 at this time.  Patient reports he is typically compliant with insulin however has missed doses related to homelessness.  He is not consistently compliant with p.o. medications reviewed medications with diabetes coordinator.  Discussed medications including Semglee and NovoLog insulins, reviewed updated dosing and discussed potential side effects, patient verbalized understanding.   Patient offered support and encouragement.  Reviewed plan to include inpatient psychiatric treatment, patient verbalized understanding and agreement with plan.  Diagnosis:  Final diagnoses:  Suicidal ideation  Cocaine abuse (HCC)  Homelessness unspecified    Total Time spent with patient: 45 minutes  Past Psychiatric History: per patient report: suicide attempt Past Medical History: Type 2 diabetes mellitus, hypokalemia, hyperlipidemia, obesity Family History: none reported Family Psychiatric  History: none reported Social History: homeless, chronic cocaine use, not linked with outpatient psychiatry  Additional Social History: see above   Pain Medications: None Prescriptions: See PTA medication list Over the Counter: See PTA medication list. History of alcohol / drug use?: Yes Longest period of sobriety (when/how long): Pt says for years before his father died in 03/19/2023 then he relapsed. Negative Consequences of Use: Personal relationships Withdrawal Symptoms: None Name of Substance 1: Cocaine (crack) 1 - Age of First Use: Pt does not recall 1 - Amount (size/oz): Varies "a lot" 1 - Frequency: Daily 1 - Duration: It has been daily from June 14-19.  Before then daily for weeks at a time. 1 - Last Use / Amount: 06/19 "a lot" 1 - Method of Aquiring: illegal purchase 1- Route of  Use: smoking                  Sleep: Good  Appetite:  Good  Current Medications:  Current Facility-Administered  Medications  Medication Dose Route Frequency Provider Last Rate Last Admin   acetaminophen (TYLENOL) tablet 650 mg  650 mg Oral Q6H PRN Sindy Guadeloupe, NP       alum & mag hydroxide-simeth (MAALOX/MYLANTA) 200-200-20 MG/5ML suspension 30 mL  30 mL Oral Q4H PRN Sindy Guadeloupe, NP       atorvastatin (LIPITOR) tablet 10 mg  10 mg Oral Daily Sindy Guadeloupe, NP   10 mg at 03/02/23 1100   gabapentin (NEURONTIN) capsule 300 mg  300 mg Oral BID Sindy Guadeloupe, NP   300 mg at 03/02/23 1100   glipiZIDE (GLUCOTROL) tablet 5 mg  5 mg Oral QAC breakfast Sindy Guadeloupe, NP   5 mg at 03/02/23 1100   insulin aspart (novoLOG) injection 0-15 Units  0-15 Units Subcutaneous TID WC Lenard Lance, FNP   15 Units at 03/02/23 1226   insulin aspart (novoLOG) injection 0-5 Units  0-5 Units Subcutaneous QHS Lenard Lance, FNP       insulin aspart (novoLOG) injection 10 Units  10 Units Subcutaneous TID WC Lenard Lance, FNP   10 Units at 03/02/23 1226   insulin glargine-yfgn (SEMGLEE) injection 20 Units  20 Units Subcutaneous Daily Lenard Lance, FNP   20 Units at 03/02/23 1225   OLANZapine zydis (ZYPREXA) disintegrating tablet 10 mg  10 mg Oral Q8H PRN Sindy Guadeloupe, NP       And   LORazepam (ATIVAN) tablet 1 mg  1 mg Oral PRN Sindy Guadeloupe, NP       And   ziprasidone (GEODON) injection 20 mg  20 mg Intramuscular PRN Sindy Guadeloupe, NP       magnesium hydroxide (MILK OF MAGNESIA) suspension 30 mL  30 mL Oral Daily PRN Sindy Guadeloupe, NP       Current Outpatient Medications  Medication Sig Dispense Refill   Accu-Chek Softclix Lancets lancets Use to check blood pressure three times daily. 100 each 6   atorvastatin (LIPITOR) 10 MG tablet TAKE 1 TABLET (10 MG TOTAL) BY MOUTH DAILY. 30 tablet 11   Blood Glucose Monitoring Suppl (ACCU-CHEK GUIDE) w/Device KIT Use to check blood glucose three times daily. 1 kit 0   gabapentin (NEURONTIN) 300 MG capsule Take 1 capsule (300 mg total) by mouth 2 (two) times daily. 60 capsule 2    glipiZIDE (GLUCOTROL) 5 MG tablet Take 1 tablet (5 mg total) by mouth daily before breakfast. 60 tablet 0   glucose blood (ACCU-CHEK GUIDE) test strip Use to check blood glucose three times daily. 100 each 6   HYDROcodone-acetaminophen (NORCO/VICODIN) 5-325 MG tablet Take 1 tablet by mouth every 6 (six) hours as needed not relieved by Tylenol or Motrin. 20 tablet 0   Insulin Glargine (BASAGLAR KWIKPEN) 100 UNIT/ML Inject 15 Units into the skin at bedtime. 15 mL 1   insulin lispro (HUMALOG KWIKPEN) 100 UNIT/ML KwikPen Inject 30 Units into the skin 2 (two) times daily. With 2 meals, hold if you are skipping a meal 15 mL 0   Insulin Pen Needle (PEN NEEDLES) 31G X 6 MM MISC Use as directed 3 (three) times daily. 100 each 11   Insulin Syringe-Needle U-100 30G X 5/16" 0.3 ML MISC Inject 30 Units into the skin 2 (two) times daily. 100 each 11   sildenafil (VIAGRA) 100 MG tablet  Take 1 tablet (100 mg total) by mouth daily as needed for erectile dysfunction. (Patient not taking: Reported on 06/13/2022) 10 tablet 0    Labs  Lab Results:  Admission on 03/01/2023  Component Date Value Ref Range Status   WBC 03/02/2023 5.7  4.0 - 10.5 K/uL Final   RBC 03/02/2023 4.71  4.22 - 5.81 MIL/uL Final   Hemoglobin 03/02/2023 13.7  13.0 - 17.0 g/dL Final   HCT 82/95/6213 43.7  39.0 - 52.0 % Final   MCV 03/02/2023 92.8  80.0 - 100.0 fL Final   MCH 03/02/2023 29.1  26.0 - 34.0 pg Final   MCHC 03/02/2023 31.4  30.0 - 36.0 g/dL Final   RDW 08/65/7846 14.1  11.5 - 15.5 % Final   Platelets 03/02/2023 256  150 - 400 K/uL Final   nRBC 03/02/2023 0.0  0.0 - 0.2 % Final   Neutrophils Relative % 03/02/2023 53  % Final   Neutro Abs 03/02/2023 3.1  1.7 - 7.7 K/uL Final   Lymphocytes Relative 03/02/2023 32  % Final   Lymphs Abs 03/02/2023 1.8  0.7 - 4.0 K/uL Final   Monocytes Relative 03/02/2023 11  % Final   Monocytes Absolute 03/02/2023 0.6  0.1 - 1.0 K/uL Final   Eosinophils Relative 03/02/2023 2  % Final   Eosinophils  Absolute 03/02/2023 0.1  0.0 - 0.5 K/uL Final   Basophils Relative 03/02/2023 1  % Final   Basophils Absolute 03/02/2023 0.0  0.0 - 0.1 K/uL Final   Immature Granulocytes 03/02/2023 1  % Final   Abs Immature Granulocytes 03/02/2023 0.04  0.00 - 0.07 K/uL Final   Performed at [facility], [address], [place], [place] [address]   Sodium 03/02/2023 137  135 - 145 mmol/L Final   Potassium 03/02/2023 5.0  3.5 - 5.1 mmol/L Final   Chloride 03/02/2023 100  98 - 111 mmol/L Final   CO2 03/02/2023 27  22 - 32 mmol/L Final   Glucose, Bld 03/02/2023 424 (H)  70 - 99 mg/dL Final   Glucose reference range applies only to samples taken after fasting for at least 8 hours.   BUN 03/02/2023 14  6 - 20 mg/dL Final   Creatinine, Ser 03/02/2023 0.90  0.61 - 1.24 mg/dL Final   Calcium 28/41/3244 9.3  8.9 - 10.3 mg/dL Final   Total Protein 09/13/7251 6.5  6.5 - 8.1 g/dL Final   Albumin 66/44/0347 3.6  3.5 - 5.0 g/dL Final   AST 42/59/5638 13 (L)  15 - 41 U/L Final   ALT 03/02/2023 16  0 - 44 U/L Final   Alkaline Phosphatase 03/02/2023 65  38 - 126 U/L Final   Total Bilirubin 03/02/2023 0.5  0.3 - 1.2 mg/dL Final   GFR, Estimated 03/02/2023 >60  >60 mL/min Final   Comment: (NOTE) Calculated using the CKD-EPI Creatinine Equation (2021)    Anion gap 03/02/2023 10  5 - 15 Final   Performed at [facility], [address], [place], [place] [address]   Hgb A1c MFr Bld 03/02/2023 10.9 (H)  4.8 - 5.6 % Final   Comment: (NOTE) Pre diabetes:          5.7%-6.4%  Diabetes:              >6.4%  Glycemic control for   <7.0% adults with diabetes    Mean Plasma Glucose 03/02/2023 266.13  mg/dL Final   Performed at [facility], [address] [place], [place] [address]   Alcohol, Ethyl (B) 03/02/2023 <10  <10 mg/dL Final   Comment: (NOTE) Lowest detectable limit for serum alcohol is 10 mg/dL.  For medical purposes only. Performed at [facility], [address], [place],  [place] [address]    TSH 03/02/2023 1.178  0.350 - 4.500 uIU/mL Final   Comment: Performed by a 3rd Generation assay with a functional sensitivity of <=0.01 uIU/mL. Performed at [facility], [address], [place], [place] [address]    POC Amphetamine UR 03/02/2023 None Detected  NONE DETECTED (Cut Off Level 1000 ng/mL) Final   POC Secobarbital (BAR) 03/02/2023 None Detected  NONE DETECTED (Cut Off Level 300 ng/mL) Final   POC Buprenorphine (BUP) 03/02/2023 None Detected  NONE DETECTED (Cut Off Level 10 ng/mL) Final   POC Oxazepam (BZO) 03/02/2023 None Detected  NONE DETECTED (Cut Off Level 300 ng/mL) Final   POC Cocaine UR 03/02/2023 Positive (A)  NONE DETECTED (Cut Off Level 300 ng/mL) Final   POC Methamphetamine UR 03/02/2023 None Detected  NONE DETECTED (Cut Off Level 1000 ng/mL) Final   POC Morphine 03/02/2023 None Detected  NONE DETECTED (Cut Off Level 300 ng/mL) Final   POC Methadone UR 03/02/2023 None Detected  NONE DETECTED (Cut Off Level 300 ng/mL) Final   POC Oxycodone UR 03/02/2023 None Detected  NONE DETECTED (Cut Off Level 100 ng/mL) Final   POC Marijuana UR 03/02/2023 Positive (A)  NONE DETECTED (Cut Off Level 50 ng/mL) Final   Glucose-Capillary 03/02/2023 499 (H)  70 - 99 mg/dL Final   Glucose reference range applies only to samples taken after fasting for at least 8 hours.   Glucose-Capillary 03/02/2023 404 (H)  70 - 99 mg/dL Final   Glucose reference range applies only to samples taken after fasting for at least 8 hours.  Admission on 02/05/2023, Discharged on 02/05/2023  Component Date Value Ref Range Status   Glucose-Capillary 02/05/2023 380 (H)  70 - 99 mg/dL Final   Glucose reference range applies only to samples taken after fasting for at least 8 hours.   Sodium 02/05/2023 138  135 - 145 mmol/L Final   Potassium 02/05/2023 4.0  3.5 - 5.1 mmol/L Final   Chloride 02/05/2023 103  98 - 111 mmol/L Final   CO2 02/05/2023 26  22 - 32 mmol/L Final   Glucose, Bld 02/05/2023  380 (H)  70 - 99 mg/dL Final   Glucose reference range applies only to samples taken after fasting for at least 8 hours.   BUN 02/05/2023 11  6 - 20 mg/dL Final   Creatinine, Ser 02/05/2023 0.92  0.61 - 1.24 mg/dL Final   Calcium 24/40/1027 9.1  8.9 - 10.3 mg/dL Final   GFR, Estimated 02/05/2023 >60  >60 mL/min Final   Comment: (NOTE) Calculated using the CKD-EPI Creatinine Equation (2021)    Anion gap 02/05/2023 9  5 - 15 Final   Performed at [facility], [address] [place], [place] [address]   WBC 02/05/2023 7.2  4.0 - 10.5 K/uL Final   RBC 02/05/2023 5.40  4.22 - 5.81 MIL/uL Final   Hemoglobin 02/05/2023 16.0  13.0 - 17.0 g/dL Final   HCT 44/11/4740 49.7  39.0 - 52.0 % Final   MCV 02/05/2023 92.0  80.0 - 100.0 fL Final   MCH 02/05/2023 29.6  26.0 - 34.0 pg Final   MCHC 02/05/2023 32.2  30.0 - 36.0 g/dL Final   RDW 59/56/3875 13.9  11.5 -  15.5 % Final   Platelets 02/05/2023 266  150 - 400 K/uL Final   nRBC 02/05/2023 0.0  0.0 - 0.2 % Final   Performed at [facility], [address] [place], [place] [address]   Glucose-Capillary 02/05/2023 342 (H)  70 - 99 mg/dL Final   Glucose reference range applies only to samples taken after fasting for at least 8 hours.   Total Protein 02/05/2023 7.5  6.5 - 8.1 g/dL Final   Albumin 33/29/5188 4.1  3.5 - 5.0 g/dL Final   AST 41/66/0630 16  15 - 41 U/L Final   ALT 02/05/2023 12  0 - 44 U/L Final   Alkaline Phosphatase 02/05/2023 83  38 - 126 U/L Final   Total Bilirubin 02/05/2023 1.2  0.3 - 1.2 mg/dL Final   Bilirubin, Direct 02/05/2023 0.3 (H)  0.0 - 0.2 mg/dL Final   Indirect Bilirubin 02/05/2023 0.9  0.3 - 0.9 mg/dL Final   Performed at [facility], [address], [place], [place] [address]   Lipase 02/05/2023 36  11 - 51 U/L Final   Performed at [facility], [address], [place], [place] [address]   pH, Ven 02/05/2023 7.352  7.25 - 7.43 Final   pCO2, Ven 02/05/2023 48.6  44 - 60 mmHg Final   pO2, Ven  02/05/2023 28 (LL)  32 - 45 mmHg Final   Bicarbonate 02/05/2023 26.9  20.0 - 28.0 mmol/L Final   TCO2 02/05/2023 28  22 - 32 mmol/L Final   O2 Saturation 02/05/2023 49  % Final   Acid-Base Excess 02/05/2023 0.0  0.0 - 2.0 mmol/L Final   Sodium 02/05/2023 140  135 - 145 mmol/L Final   Potassium 02/05/2023 3.9  3.5 - 5.1 mmol/L Final   Calcium, Ion 02/05/2023 1.22  1.15 - 1.40 mmol/L Final   HCT 02/05/2023 50.0  39.0 - 52.0 % Final   Hemoglobin 02/05/2023 17.0  13.0 - 17.0 g/dL Final   Sample type 57/32/2025 VENOUS   Final   Comment 02/05/2023 NOTIFIED PHYSICIAN   Final   Beta-Hydroxybutyric Acid 02/05/2023 0.21  0.05 - 0.27 mmol/L Final   Performed at [facility], [address], [place], [place] [address]   Glucose-Capillary 02/05/2023 213 (H)  70 - 99 mg/dL Final   Glucose reference range applies only to samples taken after fasting for at least 8 hours.  Admission on 01/27/2023, Discharged on 01/27/2023  Component Date Value Ref Range Status   Glucose-Capillary 01/27/2023 454 (H)  70 - 99 mg/dL Final   Glucose reference range applies only to samples taken after fasting for at least 8 hours.   WBC 01/27/2023 5.3  4.0 - 10.5 K/uL Final   RBC 01/27/2023 4.77  4.22 - 5.81 MIL/uL Final   Hemoglobin 01/27/2023 14.2  13.0 - 17.0 g/dL Final   HCT 23/76/2831 43.5  39.0 - 52.0 % Final   MCV 01/27/2023 91.2  80.0 - 100.0 fL Final   MCH 01/27/2023 29.8  26.0 - 34.0 pg Final   MCHC 01/27/2023 32.6  30.0 - 36.0 g/dL Final   RDW 51/76/1607 13.9  11.5 - 15.5 % Final   Platelets 01/27/2023 238  150 - 400 K/uL Final   nRBC 01/27/2023 0.0  0.0 - 0.2 % Final   Neutrophils Relative % 01/27/2023 68  % Final   Neutro Abs 01/27/2023 3.5  1.7 - 7.7 K/uL Final   Lymphocytes Relative 01/27/2023 23  % Final  Lymphs Abs 01/27/2023 1.2  0.7 - 4.0 K/uL Final   Monocytes Relative 01/27/2023 9  % Final   Monocytes Absolute 01/27/2023 0.5  0.1 - 1.0 K/uL Final   Eosinophils Relative 01/27/2023 0  % Final    Eosinophils Absolute 01/27/2023 0.0  0.0 - 0.5 K/uL Final   Basophils Relative 01/27/2023 0  % Final   Basophils Absolute 01/27/2023 0.0  0.0 - 0.1 K/uL Final   Immature Granulocytes 01/27/2023 0  % Final   Abs Immature Granulocytes 01/27/2023 0.02  0.00 - 0.07 K/uL Final   Performed at [facility], [address], [place], [place] [address]   Sodium 01/27/2023 133 (L)  135 - 145 mmol/L Final   Potassium 01/27/2023 4.5  3.5 - 5.1 mmol/L Final   Chloride 01/27/2023 99  98 - 111 mmol/L Final   CO2 01/27/2023 25  22 - 32 mmol/L Final   Glucose, Bld 01/27/2023 410 (H)  70 - 99 mg/dL Final   Glucose reference range applies only to samples taken after fasting for at least 8 hours.   BUN 01/27/2023 17  6 - 20 mg/dL Final   Creatinine, Ser 01/27/2023 0.89  0.61 - 1.24 mg/dL Final   Calcium 60/45/4098 8.5 (L)  8.9 - 10.3 mg/dL Final   Total Protein 11/91/4782 6.8  6.5 - 8.1 g/dL Final   Albumin 95/62/1308 3.6  3.5 - 5.0 g/dL Final   AST 65/78/4696 19  15 - 41 U/L Final   ALT 01/27/2023 15  0 - 44 U/L Final   Alkaline Phosphatase 01/27/2023 60  38 - 126 U/L Final   Total Bilirubin 01/27/2023 0.7  0.3 - 1.2 mg/dL Final   GFR, Estimated 01/27/2023 >60  >60 mL/min Final   Comment: (NOTE) Calculated using the CKD-EPI Creatinine Equation (2021)    Anion gap 01/27/2023 9  5 - 15 Final   Performed at [facility], [address], [place], [place] [address]   Glucose-Capillary 01/27/2023 339 (H)  70 - 99 mg/dL Final   Glucose reference range applies only to samples taken after fasting for at least 8 hours.  Admission on 12/23/2022, Discharged on 12/23/2022  Component Date Value Ref Range Status   Glucose-Capillary 12/23/2022 339 (H)  70 - 99 mg/dL Final   Glucose reference range applies only to samples taken after fasting for at least 8 hours.   Comment 1 12/23/2022 Notify RN   Final   Sodium 12/23/2022 132 (L)  135 - 145 mmol/L Final   Potassium 12/23/2022  4.0  3.5 - 5.1 mmol/L Final   Chloride 12/23/2022 102  98 - 111 mmol/L Final   CO2 12/23/2022 26  22 - 32 mmol/L Final   Glucose, Bld 12/23/2022 351 (H)  70 - 99 mg/dL Final   Glucose reference range applies only to samples taken after fasting for at least 8 hours.   BUN 12/23/2022 13  6 - 20 mg/dL Final   Creatinine, Ser 12/23/2022 0.70  0.61 - 1.24 mg/dL Final   Calcium 41/32/4401 8.3 (L)  8.9 - 10.3 mg/dL Final   GFR, Estimated 12/23/2022 >60  >60 mL/min Final   Comment: (NOTE) Calculated using the CKD-EPI Creatinine Equation (2021)    Anion gap 12/23/2022 4 (L)  5 - 15 Final   Performed at [facility], [address] [place], [place] [address]   WBC 12/23/2022 6.9  4.0 - 10.5 K/uL Final   RBC 12/23/2022 4.60  4.22 - 5.81  MIL/uL Final   Hemoglobin 12/23/2022 13.6  13.0 - 17.0 g/dL Final   HCT 56/38/7564 42.7  39.0 - 52.0 % Final   MCV 12/23/2022 92.8  80.0 - 100.0 fL Final   MCH 12/23/2022 29.6  26.0 - 34.0 pg Final   MCHC 12/23/2022 31.9  30.0 - 36.0 g/dL Final   RDW 33/29/5188 13.4  11.5 - 15.5 % Final   Platelets 12/23/2022 268  150 - 400 K/uL Final   nRBC 12/23/2022 0.0  0.0 - 0.2 % Final   Performed at [facility], [address] [place], [place] [address]   Color, Urine 12/23/2022 YELLOW  YELLOW Final   APPearance 12/23/2022 CLEAR  CLEAR Final   Specific Gravity, Urine 12/23/2022 1.026  1.005 - 1.030 Final   pH 12/23/2022 7.0  5.0 - 8.0 Final   Glucose, UA 12/23/2022 >=500 (A)  NEGATIVE mg/dL Final   Hgb urine dipstick 12/23/2022 NEGATIVE  NEGATIVE Final   Bilirubin Urine 12/23/2022 NEGATIVE  NEGATIVE Final   Ketones, ur 12/23/2022 NEGATIVE  NEGATIVE mg/dL Final   Protein, ur 63/09/6008 NEGATIVE  NEGATIVE mg/dL Final   Nitrite 93/23/5573 NEGATIVE  NEGATIVE Final   Leukocytes,Ua 12/23/2022 NEGATIVE  NEGATIVE Final   RBC / HPF 12/23/2022 0-5  0 - 5 RBC/hpf Final   WBC, UA 12/23/2022 0-5  0 - 5 WBC/hpf Final   Bacteria, UA  12/23/2022 NONE SEEN  NONE SEEN Final   Squamous Epithelial / HPF 12/23/2022 0-5  0 - 5 /HPF Final   Performed at [facility], [address], [place], [place] [address]   Glucose-Capillary 12/23/2022 295 (H)  70 - 99 mg/dL Final   Glucose reference range applies only to samples taken after fasting for at least 8 hours.   Glucose-Capillary 12/23/2022 231 (H)  70 - 99 mg/dL Final   Glucose reference range applies only to samples taken after fasting for at least 8 hours.  Office Visit on 11/30/2022  Component Date Value Ref Range Status   Creatinine, Urine 11/30/2022 108.2  Not Estab. mg/dL Final   Microalbumin, Urine 11/30/2022 12.1  Not Estab. ug/mL Final   Microalb/Creat Ratio 11/30/2022 11  0 - 29 mg/g creat Final   Comment:                        Normal:                0 -  29                        Moderately increased: 30 - 300                        Severely increased:       >300    Glucose 11/30/2022 49 (L)  70 - 99 mg/dL Final   BUN 42/70/6237 11  6 - 24 mg/dL Final   Creatinine, Ser 11/30/2022 0.77  0.76 - 1.27 mg/dL Final   eGFR 62/83/1517 108  >59 mL/min/1.73 Final   BUN/Creatinine Ratio 11/30/2022 14  9 - 20 Final   Sodium 11/30/2022 144  134 - 144 mmol/L Final   Potassium 11/30/2022 5.2  3.5 - 5.2 mmol/L Final   Chloride 11/30/2022 104  96 - 106 mmol/L Final   CO2 11/30/2022 26  20 - 29 mmol/L Final   Calcium 11/30/2022 9.3  8.7 - 10.2 mg/dL Final  Total Protein 11/30/2022 7.0  6.0 - 8.5 g/dL Final   Albumin 96/12/5407 4.2  3.8 - 4.9 g/dL Final   Globulin, Total 11/30/2022 2.8  1.5 - 4.5 g/dL Final   Albumin/Globulin Ratio 11/30/2022 1.5  1.2 - 2.2 Final   Bilirubin Total 11/30/2022 0.2  0.0 - 1.2 mg/dL Final   Alkaline Phosphatase 11/30/2022 66  44 - 121 IU/L Final   AST 11/30/2022 16  0 - 40 IU/L Final   ALT 11/30/2022 13  0 - 44 IU/L Final   Hemoglobin A1C 11/30/2022 9.0 (A)  4.0 - 5.6 % Final   POC Glucose 11/30/2022 60 (A)  70 - 99 mg/dl Final    WBC 81/19/1478 7.7  3.4 - 10.8 x10E3/uL Final   RBC 11/30/2022 4.94  4.14 - 5.80 x10E6/uL Final   Hemoglobin 11/30/2022 14.6  13.0 - 17.7 g/dL Final   Hematocrit 29/56/2130 45.2  37.5 - 51.0 % Final   MCV 11/30/2022 92  79 - 97 fL Final   MCH 11/30/2022 29.6  26.6 - 33.0 pg Final   MCHC 11/30/2022 32.3  31.5 - 35.7 g/dL Final   RDW 86/57/8469 12.7  11.6 - 15.4 % Final   Platelets 11/30/2022 269  150 - 450 x10E3/uL Final   Neutrophils 11/30/2022 61  Not Estab. % Final   Lymphs 11/30/2022 28  Not Estab. % Final   Monocytes 11/30/2022 10  Not Estab. % Final   Eos 11/30/2022 1  Not Estab. % Final   Basos 11/30/2022 0  Not Estab. % Final   Neutrophils Absolute 11/30/2022 4.6  1.4 - 7.0 x10E3/uL Final   Lymphocytes Absolute 11/30/2022 2.2  0.7 - 3.1 x10E3/uL Final   Monocytes Absolute 11/30/2022 0.8  0.1 - 0.9 x10E3/uL Final   EOS (ABSOLUTE) 11/30/2022 0.1  0.0 - 0.4 x10E3/uL Final   Basophils Absolute 11/30/2022 0.0  0.0 - 0.2 x10E3/uL Final   Immature Granulocytes 11/30/2022 0  Not Estab. % Final   Immature Grans (Abs) 11/30/2022 0.0  0.0 - 0.1 x10E3/uL Final   Cholesterol, Total 11/30/2022 112  100 - 199 mg/dL Final   Triglycerides 62/95/2841 82  0 - 149 mg/dL Final   HDL 32/44/0102 52  >39 mg/dL Final   VLDL Cholesterol Cal 11/30/2022 16  5 - 40 mg/dL Final   LDL Chol Calc (NIH) 11/30/2022 44  0 - 99 mg/dL Final   Chol/HDL Ratio 11/30/2022 2.2  0.0 - 5.0 ratio Final   Comment:                                   T. Chol/HDL Ratio                                             Men  Women                               1/2 Avg.Risk  3.4    3.3                                   Avg.Risk  5.0    4.4  2X Avg.Risk  9.6    7.1                                3X Avg.Risk 23.4   11.0   Admission on 10/28/2022, Discharged on 10/28/2022  Component Date Value Ref Range Status   WBC 10/28/2022 5.9  4.0 - 10.5 K/uL Final   RBC 10/28/2022 4.74  4.22 - 5.81 MIL/uL Final    Hemoglobin 10/28/2022 14.2  13.0 - 17.0 g/dL Final   HCT 47/42/5956 44.3  39.0 - 52.0 % Final   MCV 10/28/2022 93.5  80.0 - 100.0 fL Final   MCH 10/28/2022 30.0  26.0 - 34.0 pg Final   MCHC 10/28/2022 32.1  30.0 - 36.0 g/dL Final   RDW 38/75/6433 13.9  11.5 - 15.5 % Final   Platelets 10/28/2022 244  150 - 400 K/uL Final   nRBC 10/28/2022 0.0  0.0 - 0.2 % Final   Neutrophils Relative % 10/28/2022 72  % Final   Neutro Abs 10/28/2022 4.3  1.7 - 7.7 K/uL Final   Lymphocytes Relative 10/28/2022 19  % Final   Lymphs Abs 10/28/2022 1.1  0.7 - 4.0 K/uL Final   Monocytes Relative 10/28/2022 8  % Final   Monocytes Absolute 10/28/2022 0.5  0.1 - 1.0 K/uL Final   Eosinophils Relative 10/28/2022 1  % Final   Eosinophils Absolute 10/28/2022 0.0  0.0 - 0.5 K/uL Final   Basophils Relative 10/28/2022 0  % Final   Basophils Absolute 10/28/2022 0.0  0.0 - 0.1 K/uL Final   Immature Granulocytes 10/28/2022 0  % Final   Abs Immature Granulocytes 10/28/2022 0.01  0.00 - 0.07 K/uL Final   Performed at [facility], [address] [place], [place] [address]   Lipase 10/28/2022 137 (H)  11 - 51 U/L Final   Performed at [facility], [address] [place], [place] [address]   Opiates 10/28/2022 NONE DETECTED  NONE DETECTED Final   Cocaine 10/28/2022 POSITIVE (A)  NONE DETECTED Final   Benzodiazepines 10/28/2022 NONE DETECTED  NONE DETECTED Final   Amphetamines 10/28/2022 NONE DETECTED  NONE DETECTED Final   Tetrahydrocannabinol 10/28/2022 NONE DETECTED  NONE DETECTED Final   Barbiturates 10/28/2022 NONE DETECTED  NONE DETECTED Final   Comment: (NOTE) DRUG SCREEN FOR MEDICAL PURPOSES ONLY.  IF CONFIRMATION IS NEEDED FOR ANY PURPOSE, NOTIFY LAB WITHIN 5 DAYS.  LOWEST DETECTABLE LIMITS FOR URINE DRUG SCREEN Drug Class                     Cutoff (ng/mL) Amphetamine and metabolites    1000 Barbiturate and metabolites    200 Benzodiazepine                 200 Opiates and metabolites         300 Cocaine and metabolites        300 THC                            50 Performed at [facility], [address] [place], [place] [address]    Alcohol, Ethyl (B) 10/28/2022 <10  <10 mg/dL Final   Comment: (NOTE) Lowest detectable limit for serum alcohol is 10 mg/dL.  For medical purposes only. Performed at [facility], [address], [place], [place] [address]    Sodium  10/28/2022 135  135 - 145 mmol/L Final   Potassium 10/28/2022 4.0  3.5 - 5.1 mmol/L Final   Chloride 10/28/2022 99  98 - 111 mmol/L Final   CO2 10/28/2022 23  22 - 32 mmol/L Final   Glucose, Bld 10/28/2022 273 (H)  70 - 99 mg/dL Final   Glucose reference range applies only to samples taken after fasting for at least 8 hours.   BUN 10/28/2022 7  6 - 20 mg/dL Final   Creatinine, Ser 10/28/2022 0.69  0.61 - 1.24 mg/dL Final   Calcium 91/47/8295 9.0  8.9 - 10.3 mg/dL Final   Total Protein 62/13/0865 7.2  6.5 - 8.1 g/dL Final   Albumin 78/46/9629 3.8  3.5 - 5.0 g/dL Final   AST 52/84/1324 22  15 - 41 U/L Final   ALT 10/28/2022 17  0 - 44 U/L Final   Alkaline Phosphatase 10/28/2022 65  38 - 126 U/L Final   Total Bilirubin 10/28/2022 0.3  0.3 - 1.2 mg/dL Final   GFR, Estimated 10/28/2022 >60  >60 mL/min Final   Comment: (NOTE) Calculated using the CKD-EPI Creatinine Equation (2021)    Anion gap 10/28/2022 13  5 - 15 Final   Performed at [facility], [address], [place], [place] [address]   Salicylate Lvl 10/28/2022 <7.0 (L)  7.0 - 30.0 mg/dL Final   Performed at [facility], [address], [place], [place] [address]   Acetaminophen (Tylenol), Serum 10/28/2022 <10 (L)  10 - 30 ug/mL Final   Comment: (NOTE) Therapeutic concentrations vary significantly. A range of 10-30 ug/mL  may be an effective concentration for many patients. However, some  are best treated at concentrations outside of this range. Acetaminophen concentrations >150 ug/mL at 4 hours after ingestion  and >50 ug/mL  at 12 hours after ingestion are often associated with  toxic reactions.  Performed at [facility], [address] [place], [place] [address]   Admission on 10/10/2022, Discharged on 10/10/2022  Component Date Value Ref Range Status   Glucose-Capillary 10/10/2022 >600 (HH)  70 - 99 mg/dL Final   Glucose reference range applies only to samples taken after fasting for at least 8 hours.   Sodium 10/10/2022 129 (L)  135 - 145 mmol/L Final   Potassium 10/10/2022 4.4  3.5 - 5.1 mmol/L Final   Chloride 10/10/2022 95 (L)  98 - 111 mmol/L Final   CO2 10/10/2022 24  22 - 32 mmol/L Final   Glucose, Bld 10/10/2022 683 (HH)  70 - 99 mg/dL Final   Comment: CRITICAL RESULT CALLED TO, READ BACK BY AND VERIFIED WITH LAMB,S. RN AT 1219 10/10/22 MULLINS,T Glucose reference range applies only to samples taken after fasting for at least 8 hours.    BUN 10/10/2022 16  6 - 20 mg/dL Final   Creatinine, Ser 10/10/2022 0.93  0.61 - 1.24 mg/dL Final   Calcium 47/42/5956 8.4 (L)  8.9 - 10.3 mg/dL Final   Total Protein 38/75/6433 6.9  6.5 - 8.1 g/dL Final   Albumin 29/51/8841 3.6  3.5 - 5.0 g/dL Final   AST 66/02/3015 16  15 - 41 U/L Final   ALT 10/10/2022 11  0 - 44 U/L Final   Alkaline Phosphatase 10/10/2022 61  38 - 126 U/L Final   Total Bilirubin 10/10/2022 0.7  0.3 - 1.2 mg/dL Final   GFR, Estimated 10/10/2022 >60  >60 mL/min Final   Comment: (NOTE) Calculated using the CKD-EPI Creatinine Equation (  2021)    Anion gap 10/10/2022 10  5 - 15 Final   Performed at [facility], [address] [place], [place] [address]   Color, Urine 10/10/2022 STRAW (A)  YELLOW Final   APPearance 10/10/2022 CLEAR  CLEAR Final   Specific Gravity, Urine 10/10/2022 1.031 (H)  1.005 - 1.030 Final   pH 10/10/2022 5.0  5.0 - 8.0 Final   Glucose, UA 10/10/2022 >=500 (A)  NEGATIVE mg/dL Final   Hgb urine dipstick 10/10/2022 NEGATIVE  NEGATIVE Final   Bilirubin Urine 10/10/2022 NEGATIVE  NEGATIVE Final    Ketones, ur 10/10/2022 NEGATIVE  NEGATIVE mg/dL Final   Protein, ur 11/91/4782 NEGATIVE  NEGATIVE mg/dL Final   Nitrite 95/62/1308 NEGATIVE  NEGATIVE Final   Leukocytes,Ua 10/10/2022 NEGATIVE  NEGATIVE Final   RBC / HPF 10/10/2022 0-5  0 - 5 RBC/hpf Final   WBC, UA 10/10/2022 0-5  0 - 5 WBC/hpf Final   Bacteria, UA 10/10/2022 NONE SEEN  NONE SEEN Final   Squamous Epithelial / HPF 10/10/2022 0-5  0 - 5 /HPF Final   Performed at [facility], [address], [place], [place] [address]   Beta-Hydroxybutyric Acid 10/10/2022 0.41 (H)  0.05 - 0.27 mmol/L Final   Performed at [facility], [address] [place], [place] [address]   pH, Ven 10/10/2022 7.39  7.25 - 7.43 Final   pCO2, Ven 10/10/2022 42 (L)  44 - 60 mmHg Final   pO2, Ven 10/10/2022 59 (H)  32 - 45 mmHg Final   Bicarbonate 10/10/2022 25.4  20.0 - 28.0 mmol/L Final   Acid-Base Excess 10/10/2022 0.3  0.0 - 2.0 mmol/L Final   O2 Saturation 10/10/2022 92.1  % Final   Patient temperature 10/10/2022 37.0   Final   Performed at [facility], [address], [place], [place] [address]   Osmolality 10/10/2022 315 (H)  275 - 295 mOsm/kg Final   Comment: REPEATED TO VERIFY Performed at [facility], [address] [place], [place] [address]    WBC 10/10/2022 5.3  4.0 - 10.5 K/uL Final   RBC 10/10/2022 4.89  4.22 - 5.81 MIL/uL Final   Hemoglobin 10/10/2022 14.7  13.0 - 17.0 g/dL Final   HCT 07/08/2535 44.6  39.0 - 52.0 % Final   MCV 10/10/2022 91.2  80.0 - 100.0 fL Final   MCH 10/10/2022 30.1  26.0 - 34.0 pg Final   MCHC 10/10/2022 33.0  30.0 - 36.0 g/dL Final   RDW 64/40/3474 13.1  11.5 - 15.5 % Final   Platelets 10/10/2022 235  150 - 400 K/uL Final   nRBC 10/10/2022 0.0  0.0 - 0.2 % Final   Performed at [facility], [address], [place], [place] [address]   Glucose-Capillary 10/10/2022 480 (H)  70 - 99 mg/dL Final   Glucose reference range  applies only to samples taken after fasting for at least 8 hours.    Blood Alcohol level:  Lab Results  Component Value Date   ETH <10 03/02/2023   ETH <10 10/28/2022    Metabolic Disorder Labs: Lab Results  Component Value Date   HGBA1C 10.9 (H) 03/02/2023   MPG 266.13 03/02/2023   MPG 231.69 03/22/2021   No results found for: "PROLACTIN" Lab Results  Component Value Date   CHOL 112 11/30/2022   TRIG 82 11/30/2022   HDL 52 11/30/2022   CHOLHDL 2.2 11/30/2022   VLDL 21 04/10/2017   LDLCALC  44 11/30/2022   LDLCALC 99 03/24/2021    Therapeutic Lab Levels: No results found for: "LITHIUM" No results found for: "VALPROATE" No results found for: "CBMZ"  Physical Findings   GAD-7    Flowsheet Row Office Visit from 11/30/2022 in [facility]  Total GAD-7 Score 12      PHQ2-9    Flowsheet Row Office Visit from 11/30/2022 in [facility] Office Visit from 03[facility]24/2021 in [facility] Office Visit from 08/23/2018 in [facility] Office Visit from 05/08/2017 in [facility] Office Visit from 04/10/2017 in [facility]  PHQ-2 Total Score 0 0 0 0 0  PHQ-9 Total Score 3 -- -- -- --      Flowsheet Row ED from 03/01/2023 in [facility] ED from 02/05/2023 in [facility] at [facility] ED from 01/27/2023 in [facility] Emergency Department at [facility]  C-SSRS RISK CATEGORY High Risk No Risk No Risk        Musculoskeletal  Strength & Muscle Tone: within normal limits Gait & Station: normal Patient leans: N/A  Psychiatric Specialty Exam  Presentation  General Appearance:  Appropriate for Environment; Casual  Eye Contact: Good  Speech: Clear and Coherent; Normal Rate  Speech Volume: Normal  Handedness: Right   Mood and Affect   Mood: Depressed  Affect: Depressed   Thought Process  Thought Processes: Coherent; Goal Directed; Linear  Descriptions of Associations:Intact  Orientation:Full (Time, Place and Person)  Thought Content:Logical; WDL  Diagnosis of Schizophrenia or Schizoaffective disorder in past: No    Hallucinations:Hallucinations: None  Ideas of Reference:None  Suicidal Thoughts:Suicidal Thoughts: Yes, Passive SI Passive Intent and/or Plan: Without Intent  Homicidal Thoughts:Homicidal Thoughts: No   Sensorium  Memory: Immediate Good; Recent Fair  Judgment: Fair  Insight: Fair   Art therapist  Concentration: Good  Attention Span: Good  Recall: Good  Fund of Knowledge: Good  Language: Good   Psychomotor Activity  Psychomotor Activity: Psychomotor Activity: Normal   Assets  Assets: Communication Skills; Desire for Improvement; Financial Resources/Insurance; Transportation; Resilience   Sleep  Sleep: Sleep: Fair Number of Hours of Sleep: 4   No data recorded  Physical Exam  Physical Exam Vitals and nursing note reviewed.  Constitutional:      Appearance: Normal appearance. He is well-developed and normal weight.  HENT:     Head: Normocephalic and atraumatic.  Cardiovascular:     Rate and Rhythm: Normal rate.  Pulmonary:     Effort: Pulmonary effort is normal.  Musculoskeletal:        General: Normal range of motion.     Cervical back: Normal range of motion.  Skin:    General: Skin is warm and dry.  Neurological:     Mental Status: He is alert and oriented to person, place, and time.  Psychiatric:        Attention and Perception: Attention and perception normal.        Mood and Affect: Mood is depressed.        Speech: Speech normal.        Behavior: Behavior normal. Behavior is cooperative.        Thought Content: Thought content includes suicidal ideation.        Cognition and Memory: Cognition and memory normal.    Review of  Systems  Constitutional: Negative.   HENT:  Negative.    Eyes: Negative.   Respiratory: Negative.    Cardiovascular: Negative.   Gastrointestinal: Negative.   Genitourinary: Negative.   Musculoskeletal: Negative.   Skin: Negative.   Neurological: Negative.   Psychiatric/Behavioral:  Positive for depression, substance abuse and suicidal ideas.    Blood pressure (!) 126/97, pulse 75, temperature 98.4 F (36.9 C), resp. rate 16, SpO2 100 %. There is no height or weight on file to calculate BMI.  Treatment Plan Summary:  Labs reviewed HbA1c-10.9 UDS- +cocaine, +THC  Daily contact with patient to assess and evaluate symptoms and progress in treatment and Medication management  Inpatient psychiatric treatment recommended.   Lenard Lance, FNP 03/02/2023 12:29 PM

## 2023-03-03 ENCOUNTER — Encounter (HOSPITAL_COMMUNITY): Payer: Self-pay | Admitting: Registered Nurse

## 2023-03-03 ENCOUNTER — Other Ambulatory Visit (INDEPENDENT_AMBULATORY_CARE_PROVIDER_SITE_OTHER)
Admission: EM | Admit: 2023-03-03 | Discharge: 2023-03-13 | Disposition: A | Discharge status: Other Acute Inpatient Hospital | Payer: Medicaid Other | Source: Home / Self Care | Admitting: Registered Nurse

## 2023-03-03 DIAGNOSIS — F1994 Other psychoactive substance use, unspecified with psychoactive substance-induced mood disorder: Secondary | ICD-10-CM | POA: Diagnosis present

## 2023-03-03 DIAGNOSIS — E119 Type 2 diabetes mellitus without complications: Secondary | ICD-10-CM | POA: Diagnosis not present

## 2023-03-03 DIAGNOSIS — Z560 Unemployment, unspecified: Secondary | ICD-10-CM | POA: Diagnosis not present

## 2023-03-03 DIAGNOSIS — F1410 Cocaine abuse, uncomplicated: Secondary | ICD-10-CM | POA: Diagnosis not present

## 2023-03-03 DIAGNOSIS — R45851 Suicidal ideations: Secondary | ICD-10-CM | POA: Diagnosis not present

## 2023-03-03 DIAGNOSIS — F1910 Other psychoactive substance abuse, uncomplicated: Secondary | ICD-10-CM | POA: Diagnosis present

## 2023-03-03 DIAGNOSIS — Z5900 Homelessness unspecified: Secondary | ICD-10-CM | POA: Diagnosis not present

## 2023-03-03 DIAGNOSIS — F4381 Prolonged grief disorder: Secondary | ICD-10-CM

## 2023-03-03 LAB — GLUCOSE, CAPILLARY: Glucose-Capillary: 287 mg/dL — ABNORMAL HIGH (ref 70–99)

## 2023-03-03 MED ORDER — POLYETHYLENE GLYCOL 3350 17 G PO PACK
17.0000 g | PACK | Freq: Every day | ORAL | Status: DC | PRN
  Administered 2023-03-03 – 2023-03-10 (×2): 17 g via ORAL
  Filled 2023-03-03 (×2): qty 1

## 2023-03-03 MED ORDER — OLANZAPINE 10 MG PO TBDP
10.0000 mg | ORAL_TABLET | Freq: Three times a day (TID) | ORAL | Status: DC | PRN
Start: 2023-03-03 — End: 2023-03-03

## 2023-03-03 MED ORDER — INSULIN ASPART 100 UNIT/ML IJ SOLN
0.0000 [IU] | Freq: Three times a day (TID) | INTRAMUSCULAR | Status: DC
  Administered 2023-03-03: 8 [IU] via SUBCUTANEOUS
  Administered 2023-03-04: 3 [IU] via SUBCUTANEOUS
  Administered 2023-03-04: 11 [IU] via SUBCUTANEOUS
  Administered 2023-03-05: 8 [IU] via SUBCUTANEOUS
  Administered 2023-03-05: 5 [IU] via SUBCUTANEOUS
  Administered 2023-03-05: 3 [IU] via SUBCUTANEOUS
  Administered 2023-03-06: 5 [IU] via SUBCUTANEOUS
  Administered 2023-03-06: 8 [IU] via SUBCUTANEOUS
  Administered 2023-03-06: 5 [IU] via SUBCUTANEOUS
  Administered 2023-03-07 (×2): 8 [IU] via SUBCUTANEOUS
  Administered 2023-03-07: 5 [IU] via SUBCUTANEOUS
  Administered 2023-03-08: 2 [IU] via SUBCUTANEOUS
  Administered 2023-03-08: 3 [IU] via SUBCUTANEOUS
  Administered 2023-03-08: 5 [IU] via SUBCUTANEOUS
  Administered 2023-03-09: 3 [IU] via SUBCUTANEOUS
  Administered 2023-03-09 (×2): 5 [IU] via SUBCUTANEOUS
  Administered 2023-03-10 – 2023-03-13 (×8): 3 [IU] via SUBCUTANEOUS
  Filled 2023-03-03: qty 10

## 2023-03-03 MED ORDER — ACETAMINOPHEN 325 MG PO TABS
650.0000 mg | ORAL_TABLET | Freq: Four times a day (QID) | ORAL | Status: DC | PRN
  Administered 2023-03-06: 650 mg via ORAL
  Filled 2023-03-03: qty 2

## 2023-03-03 MED ORDER — TRAZODONE HCL 50 MG PO TABS: 50.0000 mg | ORAL_TABLET | Freq: Every evening | ORAL | Status: DC | PRN

## 2023-03-03 MED ORDER — INSULIN ASPART 100 UNIT/ML IJ SOLN: 10.0000 [IU] | Freq: Three times a day (TID) | INTRAMUSCULAR | 11 refills | Status: DC

## 2023-03-03 MED ORDER — LOPERAMIDE HCL 2 MG PO CAPS: 2.0000 mg | ORAL_CAPSULE | Freq: Once | ORAL | Status: DC

## 2023-03-03 MED ORDER — HYDROXYZINE HCL 25 MG PO TABS: 25.0000 mg | ORAL_TABLET | Freq: Three times a day (TID) | ORAL | Status: DC | PRN

## 2023-03-03 MED ORDER — HYDROXYZINE HCL 25 MG PO TABS
25.0000 mg | ORAL_TABLET | Freq: Three times a day (TID) | ORAL | 0 refills | Status: DC | PRN
  Filled 2023-03-03: qty 30, 10d supply, fill #0

## 2023-03-03 MED ORDER — TRAZODONE HCL 50 MG PO TABS
50.0000 mg | ORAL_TABLET | Freq: Every evening | ORAL | Status: DC | PRN
  Administered 2023-03-03 – 2023-03-07 (×5): 50 mg via ORAL
  Filled 2023-03-03 (×5): qty 1

## 2023-03-03 MED ORDER — GABAPENTIN 300 MG PO CAPS
300.0000 mg | ORAL_CAPSULE | Freq: Two times a day (BID) | ORAL | Status: DC
  Administered 2023-03-03 – 2023-03-13 (×18): 300 mg via ORAL
  Filled 2023-03-03 (×10): qty 1
  Filled 2023-03-03: qty 28
  Filled 2023-03-03 (×9): qty 1

## 2023-03-03 MED ORDER — MAGNESIUM HYDROXIDE 400 MG/5ML PO SUSP: 30.0000 mL | Freq: Every day | ORAL | Status: DC | PRN

## 2023-03-03 MED ORDER — INSULIN ASPART 100 UNIT/ML IJ SOLN
10.0000 [IU] | Freq: Three times a day (TID) | INTRAMUSCULAR | Status: DC
  Administered 2023-03-03 – 2023-03-04 (×2): 10 [IU] via SUBCUTANEOUS

## 2023-03-03 MED ORDER — INSULIN GLARGINE-YFGN 100 UNIT/ML ~~LOC~~ SOLN
20.0000 [IU] | Freq: Every day | SUBCUTANEOUS | Status: DC
  Administered 2023-03-04 – 2023-03-07 (×4): 20 [IU] via SUBCUTANEOUS

## 2023-03-03 MED ORDER — ALUM & MAG HYDROXIDE-SIMETH 200-200-20 MG/5ML PO SUSP: 30.0000 mL | ORAL | Status: DC | PRN

## 2023-03-03 MED ORDER — INSULIN ASPART 100 UNIT/ML IJ SOLN: 0.0000 [IU] | Freq: Three times a day (TID) | INTRAMUSCULAR | 11 refills | Status: DC

## 2023-03-03 MED ORDER — ACETAMINOPHEN 325 MG PO TABS: 650.0000 mg | ORAL_TABLET | Freq: Four times a day (QID) | ORAL | Status: DC | PRN

## 2023-03-03 MED ORDER — INSULIN ASPART 100 UNIT/ML IJ SOLN: 0.0000 [IU] | Freq: Every day | INTRAMUSCULAR | 11 refills | Status: DC

## 2023-03-03 MED ORDER — INSULIN GLARGINE-YFGN 100 UNIT/ML ~~LOC~~ SOLN: 20.0000 [IU] | Freq: Every day | SUBCUTANEOUS | 11 refills | Status: DC

## 2023-03-03 MED ORDER — MAGNESIUM HYDROXIDE 400 MG/5ML PO SUSP
30.0000 mL | Freq: Every day | ORAL | Status: DC | PRN
Start: 2023-03-03 — End: 2023-03-03

## 2023-03-03 MED ORDER — ATORVASTATIN CALCIUM 10 MG PO TABS
10.0000 mg | ORAL_TABLET | Freq: Every day | ORAL | Status: DC
  Administered 2023-03-04 – 2023-03-13 (×10): 10 mg via ORAL
  Filled 2023-03-03 (×2): qty 1
  Filled 2023-03-03: qty 14
  Filled 2023-03-03 (×8): qty 1

## 2023-03-03 MED ORDER — ZIPRASIDONE MESYLATE 20 MG IM SOLR
20.0000 mg | INTRAMUSCULAR | Status: DC | PRN
Start: 2023-03-03 — End: 2023-03-03

## 2023-03-03 MED ORDER — GLIPIZIDE 5 MG PO TABS
5.0000 mg | ORAL_TABLET | Freq: Every day | ORAL | Status: DC
  Administered 2023-03-04 – 2023-03-13 (×10): 5 mg via ORAL
  Filled 2023-03-03 (×3): qty 1
  Filled 2023-03-03: qty 14
  Filled 2023-03-03 (×7): qty 1

## 2023-03-03 MED ORDER — INSULIN ASPART 100 UNIT/ML IJ SOLN
0.0000 [IU] | Freq: Every day | INTRAMUSCULAR | Status: DC
  Administered 2023-03-04: 3 [IU] via SUBCUTANEOUS
  Administered 2023-03-05: 5 [IU] via SUBCUTANEOUS
  Administered 2023-03-06 – 2023-03-07 (×2): 3 [IU] via SUBCUTANEOUS
  Administered 2023-03-08: 4 [IU] via SUBCUTANEOUS
  Administered 2023-03-09: 3 [IU] via SUBCUTANEOUS
  Administered 2023-03-10 – 2023-03-12 (×3): 2 [IU] via SUBCUTANEOUS

## 2023-03-03 NOTE — ED Notes (Signed)
Patients blood sugar is 175

## 2023-03-03 NOTE — ED Notes (Signed)
Pt was given a meal this morning and his blood sugar was checked before meal time. Will continue to monitor for safety.

## 2023-03-03 NOTE — ED Provider Notes (Signed)
Facility Based Crisis Admission H&P  Date: 03/03/23 Patient Name: Dakota Kim MRN: [id number] Chief Complaint: Substance induced mood disorder  Diagnoses:  Final diagnoses:  Substance induced mood disorder (HCC)  Polysubstance abuse (HCC)    HPI: Dakota Kim 52 y/o male patient admitted to [facility] facility base crisis after overnight stay in continuous assessment.  Patient initially presented to [facility] as walk in voluntarily, referred by his therapist related to complaints of substance abuse, and suicidal ideation.  Patient reassessed by this nurse practitioner face to face, chart reviewed and consulted with Dr. Gretta Cool.  On evaluation patient denies current thoughts of suicidal/self-harm/homicidal ideation.  He continues to endorse depression related to his homelessness and substance abuse.  Patient states he is seeking rehab services.  Community resources discussed and patient was given a list of number to call to complete telephone assessments.    During evaluation Dakota Kim is sitting up in bed with no noted distress.  He is alert/oriented x 4, calm, cooperative, attentive, and responses were relevant and appropriate to assessment questions.  He spoke in a clear tone at moderate volume, and normal pace, with good eye contact.   He denies suicidal/self-harm/homicidal ideation, psychosis, and paranoia.  Objectively:  there is no evidence of psychosis/mania or delusional thinking.  He conversed coherently, with goal directed thoughts, and no distractibility, or pre-occupation.  Recommend patient to be transferred to [facility]   PHQ 2-9:  Flowsheet Row ED from 03/03/2023 in [facility] Office Visit from 11/30/2022 in [facility]  Thoughts that you would be better off dead, or of hurting yourself in some way Not at all Not at all  PHQ-9 Total Score 7 3       Flowsheet Row ED from 03/03/2023 in [facility] ED from 03/01/2023 in [facility] ED from 02/05/2023 in [facility] Emergency Department at [facility]  C-SSRS RISK CATEGORY No Risk High Risk No Risk         Total Time spent with patient: 30 minutes  Musculoskeletal  Strength & Muscle Tone: within normal limits Gait & Station: normal Patient leans: N/A  Psychiatric Specialty Exam  Presentation General Appearance:  Appropriate for Environment  Eye Contact: Good  Speech: Clear and Coherent  Speech Volume: Normal  Handedness: Right   Mood and Affect  Mood: Dysphoric  Affect: Appropriate; Congruent   Thought Process  Thought Processes: Coherent; Goal Directed  Descriptions of Associations:Intact  Orientation:Full (Time, Place and Person)  Thought Content:Logical  Diagnosis of Schizophrenia or Schizoaffective disorder in past: No   Hallucinations:Hallucinations: None  Ideas of Reference:None  Suicidal Thoughts:Suicidal Thoughts: No SI Passive Intent and/or Plan: Without Intent  Homicidal Thoughts:Homicidal Thoughts: No   Sensorium  Memory: Immediate Good; Recent Good  Judgment: Intact  Insight: Present   Executive Functions  Concentration: Good  Attention Span: Good  Recall: Good  Fund of Knowledge: Good  Language: Good   Psychomotor Activity  Psychomotor Activity: Psychomotor Activity: Normal   Assets  Assets: Communication Skills; Desire for Improvement   Sleep  Sleep: Sleep: Fair   Nutritional Assessment (For OBS and FBC admissions only) Has the patient had a weight loss or gain of 10 pounds or more in the last 3 months?: No Has the patient had a decrease in food intake/or appetite?: No Does the patient have dental problems?: No Does the patient have eating habits or behaviors that may be  indicators of an eating disorder including binging or inducing vomiting?: No Has the patient recently lost weight without  trying?: 0 Has the patient been eating poorly because of a decreased appetite?: 0 Malnutrition Screening Tool Score: 0    Physical Exam Vitals and nursing note reviewed.  Constitutional:      General: He is not in acute distress.    Appearance: Normal appearance. He is not ill-appearing.  HENT:     Head: Normocephalic.  Eyes:     Conjunctiva/sclera: Conjunctivae normal.  Cardiovascular:     Rate and Rhythm: Normal rate.  Pulmonary:     Effort: Pulmonary effort is normal. No respiratory distress.  Musculoskeletal:        General: Normal range of motion.     Cervical back: Normal range of motion.  Skin:    General: Skin is warm and dry.  Neurological:     Mental Status: He is alert and oriented to person, place, and time.  Psychiatric:        Attention and Perception: Attention and perception normal. He does not perceive auditory or visual hallucinations.        Mood and Affect: Affect normal. Mood is depressed.        Speech: Speech normal.        Behavior: Behavior normal. Behavior is cooperative.        Thought Content: Thought content normal. Thought content is not paranoid or delusional. Thought content does not include homicidal or suicidal ideation.        Cognition and Memory: Cognition normal.        Judgment: Judgment normal.    Review of Systems  Constitutional:        No other complaints voiced  Psychiatric/Behavioral:  Positive for depression and substance abuse. Negative for hallucinations and suicidal ideas. Nervous/anxious: Stable.   All other systems reviewed and are negative.   There were no vitals taken for this visit. There is no height or weight on file to calculate BMI.  Past Psychiatric History: Polysubstance abuse, substance induced mood disorder, depression, anxiety, homelessness   Is the patient at risk to self? No  Has the patient been a risk to self in the past 6 months? No .    Has the patient been a risk to self within the distant past? No    Is the patient a risk to others? No   Has the patient been a risk to others in the past 6 months? No   Has the patient been a risk to others within the distant past? No   Past Medical History:  Past Medical History:  Diagnosis Date   Diabetes mellitus without complication (HCC)    Vitamin D deficiency 09/2019    Family History:  Family History  Problem Relation Age of Onset   Diabetes Mellitus II Father     Social History:  Social History   Tobacco Use   Smoking status: Some Days    Packs/day: 0.15    Years: 20.00    Additional pack years: 0.00    Total pack years: 3.00    Types: Cigarettes   Smokeless tobacco: Never  Vaping Use   Vaping Use: Never used  Substance Use Topics   Alcohol use: Not Currently    Comment: occassionally   Drug use: No     Last Labs:  Admission on 03/01/2023, Discharged on 03/03/2023  Component Date Value Ref Range Status   WBC 03/02/2023 5.7  4.0 - 10.5 K/uL  Final   RBC 03/02/2023 4.71  4.22 - 5.81 MIL/uL Final   Hemoglobin 03/02/2023 13.7  13.0 - 17.0 g/dL Final   HCT 09/19/3233 43.7  39.0 - 52.0 % Final   MCV 03/02/2023 92.8  80.0 - 100.0 fL Final   MCH 03/02/2023 29.1  26.0 - 34.0 pg Final   MCHC 03/02/2023 31.4  30.0 - 36.0 g/dL Final   RDW 57/32/2025 14.1  11.5 - 15.5 % Final   Platelets 03/02/2023 256  150 - 400 K/uL Final   nRBC 03/02/2023 0.0  0.0 - 0.2 % Final   Neutrophils Relative % 03/02/2023 53  % Final   Neutro Abs 03/02/2023 3.1  1.7 - 7.7 K/uL Final   Lymphocytes Relative 03/02/2023 32  % Final   Lymphs Abs 03/02/2023 1.8  0.7 - 4.0 K/uL Final   Monocytes Relative 03/02/2023 11  % Final   Monocytes Absolute 03/02/2023 0.6  0.1 - 1.0 K/uL Final   Eosinophils Relative 03/02/2023 2  % Final   Eosinophils Absolute 03/02/2023 0.1  0.0 - 0.5 K/uL Final   Basophils Relative 03/02/2023 1  % Final   Basophils Absolute 03/02/2023 0.0  0.0 - 0.1 K/uL Final   Immature Granulocytes 03/02/2023 1  % Final   Abs Immature  Granulocytes 03/02/2023 0.04  0.00 - 0.07 K/uL Final   Performed at [facility], [address], [place], [place] [address]   Sodium 03/02/2023 137  135 - 145 mmol/L Final   Potassium 03/02/2023 5.0  3.5 - 5.1 mmol/L Final   Chloride 03/02/2023 100  98 - 111 mmol/L Final   CO2 03/02/2023 27  22 - 32 mmol/L Final   Glucose, Bld 03/02/2023 424 (H)  70 - 99 mg/dL Final   Glucose reference range applies only to samples taken after fasting for at least 8 hours.   BUN 03/02/2023 14  6 - 20 mg/dL Final   Creatinine, Ser 03/02/2023 0.90  0.61 - 1.24 mg/dL Final   Calcium 23/76/2831 9.3  8.9 - 10.3 mg/dL Final   Total Protein 51/76/1607 6.5  6.5 - 8.1 g/dL Final   Albumin 37/06/6268 3.6  3.5 - 5.0 g/dL Final   AST 48/54/6270 13 (L)  15 - 41 U/L Final   ALT 03/02/2023 16  0 - 44 U/L Final   Alkaline Phosphatase 03/02/2023 65  38 - 126 U/L Final   Total Bilirubin 03/02/2023 0.5  0.3 - 1.2 mg/dL Final   GFR, Estimated 03/02/2023 >60  >60 mL/min Final   Comment: (NOTE) Calculated using the CKD-EPI Creatinine Equation (2021)    Anion gap 03/02/2023 10  5 - 15 Final   Performed at [facility], [address], [place], [place] [address]   Hgb A1c MFr Bld 03/02/2023 10.9 (H)  4.8 - 5.6 % Final   Comment: (NOTE) Pre diabetes:          5.7%-6.4%  Diabetes:              >6.4%  Glycemic control for   <7.0% adults with diabetes    Mean Plasma Glucose 03/02/2023 266.13  mg/dL Final   Performed at [facility], [address] [place], [place] [address]   Alcohol, Ethyl (B) 03/02/2023 <10  <10 mg/dL Final   Comment: (NOTE) Lowest detectable limit for serum alcohol is 10 mg/dL.  For medical purposes only. Performed at [facility], [address], [place], [place] [address]    TSH 03/02/2023  1.178  0.350 - 4.500 uIU/mL Final   Comment: Performed by a 3rd Generation assay with a functional sensitivity of <=0.01 uIU/mL. Performed at [facility], [address],  [place], [place] [address]    POC Amphetamine UR 03/02/2023 None Detected  NONE DETECTED (Cut Off Level 1000 ng/mL) Final   POC Secobarbital (BAR) 03/02/2023 None Detected  NONE DETECTED (Cut Off Level 300 ng/mL) Final   POC Buprenorphine (BUP) 03/02/2023 None Detected  NONE DETECTED (Cut Off Level 10 ng/mL) Final   POC Oxazepam (BZO) 03/02/2023 None Detected  NONE DETECTED (Cut Off Level 300 ng/mL) Final   POC Cocaine UR 03/02/2023 Positive (A)  NONE DETECTED (Cut Off Level 300 ng/mL) Final   POC Methamphetamine UR 03/02/2023 None Detected  NONE DETECTED (Cut Off Level 1000 ng/mL) Final   POC Morphine 03/02/2023 None Detected  NONE DETECTED (Cut Off Level 300 ng/mL) Final   POC Methadone UR 03/02/2023 None Detected  NONE DETECTED (Cut Off Level 300 ng/mL) Final   POC Oxycodone UR 03/02/2023 None Detected  NONE DETECTED (Cut Off Level 100 ng/mL) Final   POC Marijuana UR 03/02/2023 Positive (A)  NONE DETECTED (Cut Off Level 50 ng/mL) Final   Glucose-Capillary 03/02/2023 499 (H)  70 - 99 mg/dL Final   Glucose reference range applies only to samples taken after fasting for at least 8 hours.   Glucose-Capillary 03/02/2023 404 (H)  70 - 99 mg/dL Final   Glucose reference range applies only to samples taken after fasting for at least 8 hours.   Glucose-Capillary 03/02/2023 214 (H)  70 - 99 mg/dL Final   Glucose reference range applies only to samples taken after fasting for at least 8 hours.  Admission on 02/05/2023, Discharged on 02/05/2023  Component Date Value Ref Range Status   Glucose-Capillary 02/05/2023 380 (H)  70 - 99 mg/dL Final   Glucose reference range applies only to samples taken after fasting for at least 8 hours.   Sodium 02/05/2023 138  135 - 145 mmol/L Final   Potassium 02/05/2023 4.0  3.5 - 5.1 mmol/L Final   Chloride 02/05/2023 103  98 - 111 mmol/L Final   CO2 02/05/2023 26  22 - 32 mmol/L Final   Glucose, Bld 02/05/2023 380 (H)  70 - 99 mg/dL Final   Glucose reference range  applies only to samples taken after fasting for at least 8 hours.   BUN 02/05/2023 11  6 - 20 mg/dL Final   Creatinine, Ser 02/05/2023 0.92  0.61 - 1.24 mg/dL Final   Calcium 62/13/0865 9.1  8.9 - 10.3 mg/dL Final   GFR, Estimated 02/05/2023 >60  >60 mL/min Final   Comment: (NOTE) Calculated using the CKD-EPI Creatinine Equation (2021)    Anion gap 02/05/2023 9  5 - 15 Final   Performed at [facility], [address], [place], [place] [address]   WBC 02/05/2023 7.2  4.0 - 10.5 K/uL Final   RBC 02/05/2023 5.40  4.22 - 5.81 MIL/uL Final   Hemoglobin 02/05/2023 16.0  13.0 - 17.0 g/dL Final   HCT 62/95/2841 49.7  39.0 - 52.0 % Final   MCV 02/05/2023 92.0  80.0 - 100.0 fL Final   MCH 02/05/2023 29.6  26.0 - 34.0 pg Final   MCHC 02/05/2023 32.2  30.0 - 36.0 g/dL Final   RDW 32/44/0102 13.9  11.5 - 15.5 % Final   Platelets 02/05/2023 266  150 - 400 K/uL Final   nRBC 02/05/2023 0.0  0.0 -  0.2 % Final   Performed at [facility], [address] [place], [place] [address]   Glucose-Capillary 02/05/2023 342 (H)  70 - 99 mg/dL Final   Glucose reference range applies only to samples taken after fasting for at least 8 hours.   Total Protein 02/05/2023 7.5  6.5 - 8.1 g/dL Final   Albumin 08/65/7846 4.1  3.5 - 5.0 g/dL Final   AST 96/29/5284 16  15 - 41 U/L Final   ALT 02/05/2023 12  0 - 44 U/L Final   Alkaline Phosphatase 02/05/2023 83  38 - 126 U/L Final   Total Bilirubin 02/05/2023 1.2  0.3 - 1.2 mg/dL Final   Bilirubin, Direct 02/05/2023 0.3 (H)  0.0 - 0.2 mg/dL Final   Indirect Bilirubin 02/05/2023 0.9  0.3 - 0.9 mg/dL Final   Performed at [facility], [address], [place], [place] [address]   Lipase 02/05/2023 36  11 - 51 U/L Final   Performed at [facility], [address] [place], [place] [address]   pH, Ven 02/05/2023 7.352  7.25 - 7.43 Final   pCO2, Ven 02/05/2023 48.6  44 - 60 mmHg Final   pO2, Ven 02/05/2023 28 (LL)  32 - 45 mmHg Final   Bicarbonate  02/05/2023 26.9  20.0 - 28.0 mmol/L Final   TCO2 02/05/2023 28  22 - 32 mmol/L Final   O2 Saturation 02/05/2023 49  % Final   Acid-Base Excess 02/05/2023 0.0  0.0 - 2.0 mmol/L Final   Sodium 02/05/2023 140  135 - 145 mmol/L Final   Potassium 02/05/2023 3.9  3.5 - 5.1 mmol/L Final   Calcium, Ion 02/05/2023 1.22  1.15 - 1.40 mmol/L Final   HCT 02/05/2023 50.0  39.0 - 52.0 % Final   Hemoglobin 02/05/2023 17.0  13.0 - 17.0 g/dL Final   Sample type 25/36/6440 VENOUS   Final   Comment 02/05/2023 NOTIFIED PHYSICIAN   Final   Beta-Hydroxybutyric Acid 02/05/2023 0.21  0.05 - 0.27 mmol/L Final   Performed at [facility], [address], [place], [place] [address]   Glucose-Capillary 02/05/2023 213 (H)  70 - 99 mg/dL Final   Glucose reference range applies only to samples taken after fasting for at least 8 hours.  Admission on 01/27/2023, Discharged on 01/27/2023  Component Date Value Ref Range Status   Glucose-Capillary 01/27/2023 454 (H)  70 - 99 mg/dL Final   Glucose reference range applies only to samples taken after fasting for at least 8 hours.   WBC 01/27/2023 5.3  4.0 - 10.5 K/uL Final   RBC 01/27/2023 4.77  4.22 - 5.81 MIL/uL Final   Hemoglobin 01/27/2023 14.2  13.0 - 17.0 g/dL Final   HCT 59/56/3875 43.5  39.0 - 52.0 % Final   MCV 01/27/2023 91.2  80.0 - 100.0 fL Final   MCH 01/27/2023 29.8  26.0 - 34.0 pg Final   MCHC 01/27/2023 32.6  30.0 - 36.0 g/dL Final   RDW 64/33/2951 13.9  11.5 - 15.5 % Final   Platelets 01/27/2023 238  150 - 400 K/uL Final   nRBC 01/27/2023 0.0  0.0 - 0.2 % Final   Neutrophils Relative % 01/27/2023 68  % Final   Neutro Abs 01/27/2023 3.5  1.7 - 7.7 K/uL Final   Lymphocytes Relative 01/27/2023 23  % Final   Lymphs Abs 01/27/2023 1.2  0.7 - 4.0 K/uL Final   Monocytes Relative 01/27/2023 9  % Final   Monocytes  Absolute 01/27/2023 0.5  0.1 - 1.0 K/uL Final   Eosinophils Relative 01/27/2023 0  % Final   Eosinophils Absolute 01/27/2023 0.0  0.0 - 0.5 K/uL  Final   Basophils Relative 01/27/2023 0  % Final   Basophils Absolute 01/27/2023 0.0  0.0 - 0.1 K/uL Final   Immature Granulocytes 01/27/2023 0  % Final   Abs Immature Granulocytes 01/27/2023 0.02  0.00 - 0.07 K/uL Final   Performed at [facility], [address], [place], [place] [address]   Sodium 01/27/2023 133 (L)  135 - 145 mmol/L Final   Potassium 01/27/2023 4.5  3.5 - 5.1 mmol/L Final   Chloride 01/27/2023 99  98 - 111 mmol/L Final   CO2 01/27/2023 25  22 - 32 mmol/L Final   Glucose, Bld 01/27/2023 410 (H)  70 - 99 mg/dL Final   Glucose reference range applies only to samples taken after fasting for at least 8 hours.   BUN 01/27/2023 17  6 - 20 mg/dL Final   Creatinine, Ser 01/27/2023 0.89  0.61 - 1.24 mg/dL Final   Calcium 20/25/4270 8.5 (L)  8.9 - 10.3 mg/dL Final   Total Protein 62/37/6283 6.8  6.5 - 8.1 g/dL Final   Albumin 15/17/6160 3.6  3.5 - 5.0 g/dL Final   AST 73/71/0626 19  15 - 41 U/L Final   ALT 01/27/2023 15  0 - 44 U/L Final   Alkaline Phosphatase 01/27/2023 60  38 - 126 U/L Final   Total Bilirubin 01/27/2023 0.7  0.3 - 1.2 mg/dL Final   GFR, Estimated 01/27/2023 >60  >60 mL/min Final   Comment: (NOTE) Calculated using the CKD-EPI Creatinine Equation (2021)    Anion gap 01/27/2023 9  5 - 15 Final   Performed at [facility], [address], [place], [place] [address]   Glucose-Capillary 01/27/2023 339 (H)  70 - 99 mg/dL Final   Glucose reference range applies only to samples taken after fasting for at least 8 hours.  Admission on 12/23/2022, Discharged on 12/23/2022  Component Date Value Ref Range Status   Glucose-Capillary 12/23/2022 339 (H)  70 - 99 mg/dL Final   Glucose reference range applies only to samples taken after fasting for at least 8 hours.   Comment 1 12/23/2022 Notify RN   Final   Sodium 12/23/2022 132 (L)  135 - 145 mmol/L Final   Potassium 12/23/2022 4.0  3.5 - 5.1 mmol/L Final   Chloride 12/23/2022 102   98 - 111 mmol/L Final   CO2 12/23/2022 26  22 - 32 mmol/L Final   Glucose, Bld 12/23/2022 351 (H)  70 - 99 mg/dL Final   Glucose reference range applies only to samples taken after fasting for at least 8 hours.   BUN 12/23/2022 13  6 - 20 mg/dL Final   Creatinine, Ser 12/23/2022 0.70  0.61 - 1.24 mg/dL Final   Calcium 62/70/3500 8.3 (L)  8.9 - 10.3 mg/dL Final   GFR, Estimated 12/23/2022 >60  >60 mL/min Final   Comment: (NOTE) Calculated using the CKD-EPI Creatinine Equation (2021)    Anion gap 12/23/2022 4 (L)  5 - 15 Final   Performed at [facility], [address], [place], [place] [address]   WBC 12/23/2022 6.9  4.0 - 10.5 K/uL Final   RBC 12/23/2022 4.60  4.22 - 5.81 MIL/uL Final   Hemoglobin 12/23/2022 13.6  13.0 - 17.0 g/dL Final   HCT 29/93/7169 42.7  39.0 - 52.0 %  Final   MCV 12/23/2022 92.8  80.0 - 100.0 fL Final   MCH 12/23/2022 29.6  26.0 - 34.0 pg Final   MCHC 12/23/2022 31.9  30.0 - 36.0 g/dL Final   RDW 78/29/5621 13.4  11.5 - 15.5 % Final   Platelets 12/23/2022 268  150 - 400 K/uL Final   nRBC 12/23/2022 0.0  0.0 - 0.2 % Final   Performed at [facility], [address], [place], [place] [address]   Color, Urine 12/23/2022 YELLOW  YELLOW Final   APPearance 12/23/2022 CLEAR  CLEAR Final   Specific Gravity, Urine 12/23/2022 1.026  1.005 - 1.030 Final   pH 12/23/2022 7.0  5.0 - 8.0 Final   Glucose, UA 12/23/2022 >=500 (A)  NEGATIVE mg/dL Final   Hgb urine dipstick 12/23/2022 NEGATIVE  NEGATIVE Final   Bilirubin Urine 12/23/2022 NEGATIVE  NEGATIVE Final   Ketones, ur 12/23/2022 NEGATIVE  NEGATIVE mg/dL Final   Protein, ur 78/46/9629 NEGATIVE  NEGATIVE mg/dL Final   Nitrite 52/84/1324 NEGATIVE  NEGATIVE Final   Leukocytes,Ua 12/23/2022 NEGATIVE  NEGATIVE Final   RBC / HPF 12/23/2022 0-5  0 - 5 RBC/hpf Final   WBC, UA 12/23/2022 0-5  0 - 5 WBC/hpf Final   Bacteria, UA 12/23/2022 NONE SEEN  NONE SEEN Final   Squamous Epithelial /  HPF 12/23/2022 0-5  0 - 5 /HPF Final   Performed at [facility], [address] [place], [place] [address]   Glucose-Capillary 12/23/2022 295 (H)  70 - 99 mg/dL Final   Glucose reference range applies only to samples taken after fasting for at least 8 hours.   Glucose-Capillary 12/23/2022 231 (H)  70 - 99 mg/dL Final   Glucose reference range applies only to samples taken after fasting for at least 8 hours.  Office Visit on 11/30/2022  Component Date Value Ref Range Status   Creatinine, Urine 11/30/2022 108.2  Not Estab. mg/dL Final   Microalbumin, Urine 11/30/2022 12.1  Not Estab. ug/mL Final   Microalb/Creat Ratio 11/30/2022 11  0 - 29 mg/g creat Final   Comment:                        Normal:                0 -  29                        Moderately increased: 30 - 300                        Severely increased:       >300    Glucose 11/30/2022 49 (L)  70 - 99 mg/dL Final   BUN 72/53/6644 11  6 - 24 mg/dL Final   Creatinine, Ser 11/30/2022 0.77  0.76 - 1.27 mg/dL Final   eGFR 03/47/4259 108  >59 mL/min/1.73 Final   BUN/Creatinine Ratio 11/30/2022 14  9 - 20 Final   Sodium 11/30/2022 144  134 - 144 mmol/L Final   Potassium 11/30/2022 5.2  3.5 - 5.2 mmol/L Final   Chloride 11/30/2022 104  96 - 106 mmol/L Final   CO2 11/30/2022 26  20 - 29 mmol/L Final   Calcium 11/30/2022 9.3  8.7 - 10.2 mg/dL Final   Total Protein 56/38/7564 7.0  6.0 - 8.5 g/dL Final   Albumin 33/29/5188 4.2  3.8 - 4.9 g/dL Final  Globulin, Total 11/30/2022 2.8  1.5 - 4.5 g/dL Final   Albumin/Globulin Ratio 11/30/2022 1.5  1.2 - 2.2 Final   Bilirubin Total 11/30/2022 0.2  0.0 - 1.2 mg/dL Final   Alkaline Phosphatase 11/30/2022 66  44 - 121 IU/L Final   AST 11/30/2022 16  0 - 40 IU/L Final   ALT 11/30/2022 13  0 - 44 IU/L Final   Hemoglobin A1C 11/30/2022 9.0 (A)  4.0 - 5.6 % Final   POC Glucose 11/30/2022 60 (A)  70 - 99 mg/dl Final   WBC 11/91/4782 7.7  3.4 - 10.8 x10E3/uL Final   RBC  11/30/2022 4.94  4.14 - 5.80 x10E6/uL Final   Hemoglobin 11/30/2022 14.6  13.0 - 17.7 g/dL Final   Hematocrit 95/62/1308 45.2  37.5 - 51.0 % Final   MCV 11/30/2022 92  79 - 97 fL Final   MCH 11/30/2022 29.6  26.6 - 33.0 pg Final   MCHC 11/30/2022 32.3  31.5 - 35.7 g/dL Final   RDW 65/78/4696 12.7  11.6 - 15.4 % Final   Platelets 11/30/2022 269  150 - 450 x10E3/uL Final   Neutrophils 11/30/2022 61  Not Estab. % Final   Lymphs 11/30/2022 28  Not Estab. % Final   Monocytes 11/30/2022 10  Not Estab. % Final   Eos 11/30/2022 1  Not Estab. % Final   Basos 11/30/2022 0  Not Estab. % Final   Neutrophils Absolute 11/30/2022 4.6  1.4 - 7.0 x10E3/uL Final   Lymphocytes Absolute 11/30/2022 2.2  0.7 - 3.1 x10E3/uL Final   Monocytes Absolute 11/30/2022 0.8  0.1 - 0.9 x10E3/uL Final   EOS (ABSOLUTE) 11/30/2022 0.1  0.0 - 0.4 x10E3/uL Final   Basophils Absolute 11/30/2022 0.0  0.0 - 0.2 x10E3/uL Final   Immature Granulocytes 11/30/2022 0  Not Estab. % Final   Immature Grans (Abs) 11/30/2022 0.0  0.0 - 0.1 x10E3/uL Final   Cholesterol, Total 11/30/2022 112  100 - 199 mg/dL Final   Triglycerides 29/52/8413 82  0 - 149 mg/dL Final   HDL 24/40/1027 52  >39 mg/dL Final   VLDL Cholesterol Cal 11/30/2022 16  5 - 40 mg/dL Final   LDL Chol Calc (NIH) 11/30/2022 44  0 - 99 mg/dL Final   Chol/HDL Ratio 11/30/2022 2.2  0.0 - 5.0 ratio Final   Comment:                                   T. Chol/HDL Ratio                                             Men  Women                               1/2 Avg.Risk  3.4    3.3                                   Avg.Risk  5.0    4.4                                2X Avg.Risk  9.6  7.1                                3X Avg.Risk 23.4   11.0   Admission on 10/28/2022, Discharged on 10/28/2022  Component Date Value Ref Range Status   WBC 10/28/2022 5.9  4.0 - 10.5 K/uL Final   RBC 10/28/2022 4.74  4.22 - 5.81 MIL/uL Final   Hemoglobin 10/28/2022 14.2  13.0 - 17.0 g/dL Final    HCT 16/06/9603 44.3  39.0 - 52.0 % Final   MCV 10/28/2022 93.5  80.0 - 100.0 fL Final   MCH 10/28/2022 30.0  26.0 - 34.0 pg Final   MCHC 10/28/2022 32.1  30.0 - 36.0 g/dL Final   RDW 54/05/8118 13.9  11.5 - 15.5 % Final   Platelets 10/28/2022 244  150 - 400 K/uL Final   nRBC 10/28/2022 0.0  0.0 - 0.2 % Final   Neutrophils Relative % 10/28/2022 72  % Final   Neutro Abs 10/28/2022 4.3  1.7 - 7.7 K/uL Final   Lymphocytes Relative 10/28/2022 19  % Final   Lymphs Abs 10/28/2022 1.1  0.7 - 4.0 K/uL Final   Monocytes Relative 10/28/2022 8  % Final   Monocytes Absolute 10/28/2022 0.5  0.1 - 1.0 K/uL Final   Eosinophils Relative 10/28/2022 1  % Final   Eosinophils Absolute 10/28/2022 0.0  0.0 - 0.5 K/uL Final   Basophils Relative 10/28/2022 0  % Final   Basophils Absolute 10/28/2022 0.0  0.0 - 0.1 K/uL Final   Immature Granulocytes 10/28/2022 0  % Final   Abs Immature Granulocytes 10/28/2022 0.01  0.00 - 0.07 K/uL Final   Performed at [facility], [address], [place], [place] [address]   Lipase 10/28/2022 137 (H)  11 - 51 U/L Final   Performed at [facility], [address], [place], [place] [address]   Opiates 10/28/2022 NONE DETECTED  NONE DETECTED Final   Cocaine 10/28/2022 POSITIVE (A)  NONE DETECTED Final   Benzodiazepines 10/28/2022 NONE DETECTED  NONE DETECTED Final   Amphetamines 10/28/2022 NONE DETECTED  NONE DETECTED Final   Tetrahydrocannabinol 10/28/2022 NONE DETECTED  NONE DETECTED Final   Barbiturates 10/28/2022 NONE DETECTED  NONE DETECTED Final   Comment: (NOTE) DRUG SCREEN FOR MEDICAL PURPOSES ONLY.  IF CONFIRMATION IS NEEDED FOR ANY PURPOSE, NOTIFY LAB WITHIN 5 DAYS.  LOWEST DETECTABLE LIMITS FOR URINE DRUG SCREEN Drug Class                     Cutoff (ng/mL) Amphetamine and metabolites    1000 Barbiturate and metabolites    200 Benzodiazepine                 200 Opiates and metabolites        300 Cocaine and metabolites        300 THC                             50 Performed at [facility], [address], [place], [place] [address]    Alcohol, Ethyl (B) 10/28/2022 <10  <10 mg/dL Final   Comment: (NOTE) Lowest detectable limit for serum alcohol is 10 mg/dL.  For medical purposes only. Performed at [facility], [address] [place], [place] [address]    Sodium 10/28/2022 135  135 - 145 mmol/L  Final   Potassium 10/28/2022 4.0  3.5 - 5.1 mmol/L Final   Chloride 10/28/2022 99  98 - 111 mmol/L Final   CO2 10/28/2022 23  22 - 32 mmol/L Final   Glucose, Bld 10/28/2022 273 (H)  70 - 99 mg/dL Final   Glucose reference range applies only to samples taken after fasting for at least 8 hours.   BUN 10/28/2022 7  6 - 20 mg/dL Final   Creatinine, Ser 10/28/2022 0.69  0.61 - 1.24 mg/dL Final   Calcium 98/07/9146 9.0  8.9 - 10.3 mg/dL Final   Total Protein 82/95/6213 7.2  6.5 - 8.1 g/dL Final   Albumin 08/65/7846 3.8  3.5 - 5.0 g/dL Final   AST 96/29/5284 22  15 - 41 U/L Final   ALT 10/28/2022 17  0 - 44 U/L Final   Alkaline Phosphatase 10/28/2022 65  38 - 126 U/L Final   Total Bilirubin 10/28/2022 0.3  0.3 - 1.2 mg/dL Final   GFR, Estimated 10/28/2022 >60  >60 mL/min Final   Comment: (NOTE) Calculated using the CKD-EPI Creatinine Equation (2021)    Anion gap 10/28/2022 13  5 - 15 Final   Performed at [facility], [address] [place], [place] [address]   Salicylate Lvl 10/28/2022 <7.0 (L)  7.0 - 30.0 mg/dL Final   Performed at [facility], [address], [place], [place] [address]   Acetaminophen (Tylenol), Serum 10/28/2022 <10 (L)  10 - 30 ug/mL Final   Comment: (NOTE) Therapeutic concentrations vary significantly. A range of 10-30 ug/mL  may be an effective concentration for many patients. However, some  are best treated at concentrations outside of this range. Acetaminophen concentrations >150 ug/mL at 4 hours after ingestion  and >50 ug/mL at 12 hours after ingestion are often associated with   toxic reactions.  Performed at [facility], [address], [place], [place] [address]   Admission on 10/10/2022, Discharged on 10/10/2022  Component Date Value Ref Range Status   Glucose-Capillary 10/10/2022 >600 (HH)  70 - 99 mg/dL Final   Glucose reference range applies only to samples taken after fasting for at least 8 hours.   Sodium 10/10/2022 129 (L)  135 - 145 mmol/L Final   Potassium 10/10/2022 4.4  3.5 - 5.1 mmol/L Final   Chloride 10/10/2022 95 (L)  98 - 111 mmol/L Final   CO2 10/10/2022 24  22 - 32 mmol/L Final   Glucose, Bld 10/10/2022 683 (HH)  70 - 99 mg/dL Final   Comment: CRITICAL RESULT CALLED TO, READ BACK BY AND VERIFIED WITH LAMB,S. RN AT 1219 10/10/22 MULLINS,T Glucose reference range applies only to samples taken after fasting for at least 8 hours.    BUN 10/10/2022 16  6 - 20 mg/dL Final   Creatinine, Ser 10/10/2022 0.93  0.61 - 1.24 mg/dL Final   Calcium 44/11/4740 8.4 (L)  8.9 - 10.3 mg/dL Final   Total Protein 59/56/3875 6.9  6.5 - 8.1 g/dL Final   Albumin 64/33/2951 3.6  3.5 - 5.0 g/dL Final   AST 88/41/6606 16  15 - 41 U/L Final   ALT 10/10/2022 11  0 - 44 U/L Final   Alkaline Phosphatase 10/10/2022 61  38 - 126 U/L Final   Total Bilirubin 10/10/2022 0.7  0.3 - 1.2 mg/dL Final   GFR, Estimated 10/10/2022 >60  >60 mL/min Final   Comment: (NOTE) Calculated using the CKD-EPI Creatinine Equation (2021)    Anion gap 10/10/2022  10  5 - 15 Final   Performed at [facility], [address] [place], [place] [address]   Color, Urine 10/10/2022 STRAW (A)  YELLOW Final   APPearance 10/10/2022 CLEAR  CLEAR Final   Specific Gravity, Urine 10/10/2022 1.031 (H)  1.005 - 1.030 Final   pH 10/10/2022 5.0  5.0 - 8.0 Final   Glucose, UA 10/10/2022 >=500 (A)  NEGATIVE mg/dL Final   Hgb urine dipstick 10/10/2022 NEGATIVE  NEGATIVE Final   Bilirubin Urine 10/10/2022 NEGATIVE  NEGATIVE Final   Ketones, ur 10/10/2022 NEGATIVE  NEGATIVE mg/dL  Final   Protein, ur 60/45/4098 NEGATIVE  NEGATIVE mg/dL Final   Nitrite 11/91/4782 NEGATIVE  NEGATIVE Final   Leukocytes,Ua 10/10/2022 NEGATIVE  NEGATIVE Final   RBC / HPF 10/10/2022 0-5  0 - 5 RBC/hpf Final   WBC, UA 10/10/2022 0-5  0 - 5 WBC/hpf Final   Bacteria, UA 10/10/2022 NONE SEEN  NONE SEEN Final   Squamous Epithelial / HPF 10/10/2022 0-5  0 - 5 /HPF Final   Performed at [facility], [address] [place], [place] [address]   Beta-Hydroxybutyric Acid 10/10/2022 0.41 (H)  0.05 - 0.27 mmol/L Final   Performed at [facility], [address] [place], [place] [address]   pH, Ven 10/10/2022 7.39  7.25 - 7.43 Final   pCO2, Ven 10/10/2022 42 (L)  44 - 60 mmHg Final   pO2, Ven 10/10/2022 59 (H)  32 - 45 mmHg Final   Bicarbonate 10/10/2022 25.4  20.0 - 28.0 mmol/L Final   Acid-Base Excess 10/10/2022 0.3  0.0 - 2.0 mmol/L Final   O2 Saturation 10/10/2022 92.1  % Final   Patient temperature 10/10/2022 37.0   Final   Performed at [facility], [address], [place], [place] [address]   Osmolality 10/10/2022 315 (H)  275 - 295 mOsm/kg Final   Comment: REPEATED TO VERIFY Performed at [facility], [address] [place], [place] [address]    WBC 10/10/2022 5.3  4.0 - 10.5 K/uL Final   RBC 10/10/2022 4.89  4.22 - 5.81 MIL/uL Final   Hemoglobin 10/10/2022 14.7  13.0 - 17.0 g/dL Final   HCT 84/13/2440 44.6  39.0 - 52.0 % Final   MCV 10/10/2022 91.2  80.0 - 100.0 fL Final   MCH 10/10/2022 30.1  26.0 - 34.0 pg Final   MCHC 10/10/2022 33.0  30.0 - 36.0 g/dL Final   RDW 07/08/2535 13.1  11.5 - 15.5 % Final   Platelets 10/10/2022 235  150 - 400 K/uL Final   nRBC 10/10/2022 0.0  0.0 - 0.2 % Final   Performed at [facility], [address], [place], [place] [address]   Glucose-Capillary 10/10/2022 480 (H)  70 - 99 mg/dL Final   Glucose reference range applies only to samples taken after fasting for at  least 8 hours.    Allergies: Patient has no known allergies.  Medications:  Facility Ordered Medications  Medication   acetaminophen (TYLENOL) tablet 650 mg   alum & mag hydroxide-simeth (MAALOX/MYLANTA) 200-200-20 MG/5ML suspension 30 mL   traZODone (DESYREL) tablet 50 mg   [START ON 03/04/2023] atorvastatin (LIPITOR) tablet 10 mg   gabapentin (NEURONTIN) capsule 300 mg   [START ON 03/04/2023] glipiZIDE (GLUCOTROL) tablet 5 mg   insulin aspart (novoLOG) injection 0-15 Units   insulin aspart (novoLOG) injection 0-5 Units   [START ON 03/04/2023] insulin glargine-yfgn (SEMGLEE) injection 20 Units  insulin aspart (novoLOG) injection 10 Units   PTA Medications  Medication Sig   atorvastatin (LIPITOR) 10 MG tablet TAKE 1 TABLET (10 MG TOTAL) BY MOUTH DAILY. (Patient not taking: Reported on 03/02/2023)   glipiZIDE (GLUCOTROL) 5 MG tablet Take 1 tablet (5 mg total) by mouth daily before breakfast. (Patient not taking: Reported on 03/02/2023)   Insulin Glargine (BASAGLAR KWIKPEN) 100 UNIT/ML Inject 15 Units into the skin at bedtime. (Patient taking differently: Inject 20 Units into the skin at bedtime.)   gabapentin (NEURONTIN) 300 MG capsule Take 1 capsule (300 mg total) by mouth 2 (two) times daily. (Patient not taking: Reported on 03/02/2023)   hydrOXYzine (ATARAX) 25 MG tablet Take 1 tablet (25 mg total) by mouth 3 (three) times daily as needed for anxiety.   traZODone (DESYREL) 50 MG tablet Take 1 tablet (50 mg total) by mouth at bedtime as needed for sleep.   insulin aspart (NOVOLOG) 100 UNIT/ML injection Inject 10 Units into the skin 3 (three) times daily with meals.   insulin aspart (NOVOLOG) 100 UNIT/ML injection Inject 0-15 Units into the skin 3 (three) times daily with meals.   insulin aspart (NOVOLOG) 100 UNIT/ML injection Inject 0-5 Units into the skin at bedtime.   [START ON 03/04/2023] insulin glargine-yfgn (SEMGLEE) 100 UNIT/ML injection Inject 0.2 mLs (20 Units total) into the skin  daily.    Long Term Goals: Improvement in symptoms so as ready for discharge  Short Term Goals: Patient will verbalize feelings in meetings with treatment team members., Patient will attend at least of 50% of the groups daily., Pt will complete the PHQ9 on admission, day 3 and discharge., Patient will participate in completing the [place] Suicide Severity Rating Scale, Patient will score a low risk of violence for 24 hours prior to discharge, and Patient will take medications as prescribed daily.  Medical Decision Making  Dontavion Noxon was admitted to [facility] base crisis unit under the service of Nelly Rout, MD for Substance induced mood disorder [facility]), crisis management, and stabilization. Routine labs ordered, which include Lab Orders  No laboratory test(s) ordered today   Medication Management: Medications started Meds ordered this encounter  Medications   acetaminophen (TYLENOL) tablet 650 mg   alum & mag hydroxide-simeth (MAALOX/MYLANTA) 200-200-20 MG/5ML suspension 30 mL   DISCONTD: magnesium hydroxide (MILK OF MAGNESIA) suspension 30 mL   DISCONTD: hydrOXYzine (ATARAX) tablet 25 mg   traZODone (DESYREL) tablet 50 mg   atorvastatin (LIPITOR) tablet 10 mg   gabapentin (NEURONTIN) capsule 300 mg   glipiZIDE (GLUCOTROL) tablet 5 mg   insulin aspart (novoLOG) injection 0-15 Units    Order Specific Question:   Correction coverage:    Answer:   Moderate (average weight, post-op)    Order Specific Question:   CBG < 70:    Answer:   Implement Hypoglycemia Standing Orders and refer to Hypoglycemia Standing Orders sidebar report    Order Specific Question:   CBG 70 - 120:    Answer:   0 units    Order Specific Question:   CBG 121 - 150:    Answer:   2 units    Order Specific Question:   CBG 151 - 200:    Answer:   3 units    Order Specific Question:   CBG 201 - 250:    Answer:   5 units    Order Specific Question:   CBG 251 - 300:     Answer:   8  units    Order Specific Question:   CBG 301 - 350:    Answer:   11 units    Order Specific Question:   CBG 351 - 400:    Answer:   15 units    Order Specific Question:   CBG > 400    Answer:   call MD and obtain STAT lab verification   insulin aspart (novoLOG) injection 0-5 Units    Order Specific Question:   Correction coverage:    Answer:   HS scale    Order Specific Question:   CBG < 70:    Answer:   Implement Hypoglycemia Standing Orders and refer to Hypoglycemia Standing Orders sidebar report    Order Specific Question:   CBG 70 - 120:    Answer:   0 units    Order Specific Question:   CBG 121 - 150:    Answer:   0 units    Order Specific Question:   CBG 151 - 200:    Answer:   0 units    Order Specific Question:   CBG 201 - 250:    Answer:   2 units    Order Specific Question:   CBG 251 - 300:    Answer:   3 units    Order Specific Question:   CBG 301 - 350:    Answer:   4 units    Order Specific Question:   CBG 351 - 400:    Answer:   5 units    Order Specific Question:   CBG > 400    Answer:   call MD and obtain STAT lab verification   insulin glargine-yfgn (SEMGLEE) injection 20 Units   insulin aspart (novoLOG) injection 10 Units   DISCONTD: traZODone (DESYREL) tablet 50 mg   DISCONTD: OLANZapine zydis (ZYPREXA) disintegrating tablet 10 mg   DISCONTD: ziprasidone (GEODON) injection 20 mg   DISCONTD: magnesium hydroxide (MILK OF MAGNESIA) suspension 30 mL   DISCONTD: hydrOXYzine (ATARAX) tablet 25 mg   DISCONTD: alum & mag hydroxide-simeth (MAALOX/MYLANTA) 200-200-20 MG/5ML suspension 30 mL   DISCONTD: acetaminophen (TYLENOL) tablet 650 mg    Will maintain observation checks every 15 minutes for safety. Psychosocial education regarding relapse prevention and self-care; social and communication  Social work will consult with patient to discuss discharge and follow up plan.  Recommendations  Based on my evaluation the patient does not appear to have an  emergency medical condition.  Merve Hotard, NP 03/03/23  2:53 PM

## 2023-03-03 NOTE — Group Note (Signed)
Group Topic: Relapse and Recovery  Group Date: 03/03/2023 Start Time: 2000 End Time: 2100 Facilitators: AA Department: [facility]  Number of Participants: 5 Group Focus: abuse issues Treatment Modality:  Patient-Centered Therapy Interventions utilized were group exercise Purpose: relapse prevention strategies and trigger / craving management  Name: Demarri Elie Date of Birth: [birth year]  MR: [id number]    Level of Participation: active Quality of Participation: cooperative Interactions with others: gave feedback Mood/Affect: appropriate Triggers (if applicable): People places and things Cognition: coherent/clear Progress: Significant Response: In agreement with treatment plan Plan: referral / recommendations  Patients Problems:  Patient Active Problem List   Diagnosis Date Noted   Polysubstance abuse (HCC) 03/03/2023   Substance induced mood disorder (HCC) 03/03/2023   Foot infection 11/30/2022   Hyperlipidemia 02/26/2019   Class 2 severe obesity due to excess calories with serious comorbidity and body mass index (BMI) of 35.0 to 35.9 in adult [facility]) 02/26/2019   Type 2 diabetes mellitus without complication, with long-term current use of insulin (HCC) 05/13/2017   Hypokalemia 11/06/2012

## 2023-03-03 NOTE — ED Notes (Signed)
Report given to AnnMarie

## 2023-03-03 NOTE — ED Notes (Signed)
Patient admitted from [facility] to [facility] for detox from crack cocaine, opiates and etoh.  Patient is a insulin dependant diabetic with last f.s. 258 he received 18 units of novolog.  Patient was cooperative with admission process and was oriented to unit and to his room.  He is calm and pleasant on approach.  No evidence of withdrawal at this time.  Will monitor and provide a safe environment.

## 2023-03-03 NOTE — ED Notes (Signed)
Patient A&Ox4. Patient is pleasant. Patient is calm and cooperative. Patient denies any physical complaints when asked. No acute distress noted. Support and encouragement provided. Routine safety checks conducted according to facility protocol. Encouraged patient to notify staff if thoughts of harm toward self or others arise. Patient verbalize understanding and agreement. Will continue to monitor for safety.

## 2023-03-03 NOTE — ED Notes (Signed)
Pt sleeping at present, no distress noted.  Monitoring for safety. 

## 2023-03-03 NOTE — ED Notes (Signed)
A/O, pleasant and cooperative. Engaged in conversation with staff. Denies c/o withdrawal.Denies SI/HI/AVH. No noted distress. Will continue to monitor for safety

## 2023-03-03 NOTE — ED Notes (Signed)
CBG 258 

## 2023-03-03 NOTE — ED Notes (Signed)
Pt is currently sleeping, no distress noted, environmental check complete, will continue to monitor patient for safety.  

## 2023-03-04 DIAGNOSIS — Z5900 Homelessness unspecified: Secondary | ICD-10-CM | POA: Diagnosis not present

## 2023-03-04 DIAGNOSIS — Z560 Unemployment, unspecified: Secondary | ICD-10-CM | POA: Diagnosis not present

## 2023-03-04 DIAGNOSIS — F1410 Cocaine abuse, uncomplicated: Secondary | ICD-10-CM | POA: Diagnosis not present

## 2023-03-04 DIAGNOSIS — E119 Type 2 diabetes mellitus without complications: Secondary | ICD-10-CM | POA: Diagnosis not present

## 2023-03-04 DIAGNOSIS — R45851 Suicidal ideations: Secondary | ICD-10-CM | POA: Diagnosis not present

## 2023-03-04 LAB — GLUCOSE, CAPILLARY
Glucose-Capillary: 252 mg/dL — ABNORMAL HIGH (ref 70–99)
Glucose-Capillary: 341 mg/dL — ABNORMAL HIGH (ref 70–99)
Glucose-Capillary: 286 mg/dL — ABNORMAL HIGH (ref 70–99)

## 2023-03-04 MED ORDER — MELATONIN 3 MG PO TABS
3.0000 mg | ORAL_TABLET | Freq: Once | ORAL | Status: AC
  Administered 2023-03-04: 3 mg via ORAL
  Filled 2023-03-04: qty 1

## 2023-03-04 NOTE — ED Notes (Signed)
"  Pt denies SI, plan, and intention.  Suicide safety plan completed, reviewed with this RN, given to the patient, and a copy in the chart." 

## 2023-03-04 NOTE — Group Note (Signed)
Group Topic: Communication  Group Date: 03/04/2023 Start Time: 1100 End Time: 1115 Facilitators: Merrie Roof, RN  Department: [facility]  Number of Participants: 6  Group Focus: safety plan Treatment Modality:  Behavior Modification Therapy Interventions utilized were assignment Purpose: enhance coping skills  Name: Dakota Kim Date of Birth: 1971/02/07  MR: [id number]    Level of Participation: active Quality of Participation: cooperative Interactions with others: gave feedback Mood/Affect: appropriate Triggers (if applicable):  Cognition: coherent/clear Progress: Significant Response:  Plan: patient will be encouraged to continue therapy  Patients Problems:  Patient Active Problem List   Diagnosis Date Noted   Polysubstance abuse (HCC) 03/03/2023   Substance induced mood disorder (HCC) 03/03/2023   Foot infection 11/30/2022   Hyperlipidemia 02/26/2019   Class 2 severe obesity due to excess calories with serious comorbidity and body mass index (BMI) of 35.0 to 35.9 in adult [facility]) 02/26/2019   Type 2 diabetes mellitus without complication, with long-term current use of insulin (HCC) 05/13/2017   Hypokalemia 11/06/2012

## 2023-03-04 NOTE — ED Notes (Signed)
Provider states to give sliding scale and not to give the 10 units ordered with meals. Provider is aware that blood sugar is 341

## 2023-03-04 NOTE — ED Notes (Signed)
Patient in milieu. Environment is secured. Will continue to monitor for safety. 

## 2023-03-04 NOTE — ED Notes (Signed)
Pt observed/assessed in room sleeping. RR even and unlabored, appearing in no noted distress. Environmental check complete, will continue to monitor for safety 

## 2023-03-04 NOTE — ED Notes (Signed)
Pt is currently sleeping, no distress noted, environmental check complete, will continue to monitor patient for safety.  

## 2023-03-04 NOTE — ED Notes (Addendum)
Provider states to give 10units of novolog and told rn  to  hold the sliding scale 8 units. Of novolog.Patient showing no s/s of hyperglecmia.Provider verified the 10 units of novolog.

## 2023-03-04 NOTE — ED Notes (Signed)
Patient is currently in the dining room watching television , will continue to monitor patient for safety

## 2023-03-04 NOTE — Group Note (Signed)
Group Topic: Balance in Life  Group Date: 03/04/2023 Start Time: 0730 End Time: 0800 Facilitators: Rae Lips B  Department: [facility]  Number of Participants: 6  Group Focus: activities of daily living skills Treatment Modality:  Exposure Therapy Interventions utilized were leisure development Purpose: express feelings  Name: Dakota Kim Date of Birth: 1970-12-15  MR: [id number]    Level of Participation: active Quality of Participation: attentive Interactions with others: gave feedback Mood/Affect: appropriate Triggers (if applicable): NA Cognition: coherent/clear Progress: Gaining insight Response: NA Plan: patient will be encouraged to keep going to groups.  Patients Problems:  Patient Active Problem List   Diagnosis Date Noted   Polysubstance abuse (HCC) 03/03/2023   Substance induced mood disorder (HCC) 03/03/2023   Foot infection 11/30/2022   Hyperlipidemia 02/26/2019   Class 2 severe obesity due to excess calories with serious comorbidity and body mass index (BMI) of 35.0 to 35.9 in adult [facility]) 02/26/2019   Type 2 diabetes mellitus without complication, with long-term current use of insulin (HCC) 05/13/2017   Hypokalemia 11/06/2012

## 2023-03-04 NOTE — ED Provider Notes (Addendum)
Behavioral Health Progress Note  Date and Time: 03/04/2023 10:48 AM Name: Drayton Tieu MRN:  [id number]  Subjective:  Blaze stated " I have an addiction to cocaine"  Martavion Picha was admitted due to substance-induced mood disorder, substance use and reported passive suicidal ideations.  He was seen and evaluated face-to-face by this provider.  Denying suicidal or homicidal ideations during this assessment.  Denies auditory or visual hallucinations.  Does report a previous suicide attempt by overdosing on prescription medication and drinking alcohol in 2017.    States he is interested in seeking a rehabilitation program such as [facility] or residential treatment house such as the [facility].  He reports he was residing at [facility] however due to unsanitary living conditions he decided to leave.  He denied that he has family support here in [place].  States most of his family resides in [place].  States he made multiple attempts to contact [facility] and residential treatment programs on yesterday without any success.  Demetreus depression or depressive symptoms .  Denied that is prescribed any psychotropic medications.  Reports his primary care provider prescribes his gabapentin and insulin.  Denies that he is followed by therapy or psychiatry currently.  PHQ-9 completed see chart.  Staff to continue to monitor for safety.  Chart review UDS positive for cocaine and marijuana.  Support encouragement reassurance was provided.  Zacharius Tschirhart is sitting; he is alert/oriented x 4; calm/cooperative; and mood congruent with affect.  Patient is speaking in a clear tone at moderate volume, and normal pace; with good eye contact.  Her thought process is coherent and relevant; There is no indication that he is currently responding to internal/external stimuli or experiencing delusional thought content.  Patient denies suicidal/self-harm/homicidal ideation, psychosis, and paranoia.  Patient has  remained calm throughout assessment and has answered questions appropriately.   Diagnosis:  Final diagnoses:  Substance induced mood disorder (HCC)  Polysubstance abuse (HCC)    Total Time spent with patient: 15 minutes  Past Psychiatric History: See HPI Past Medical History: See HPI  family History: See HPI Family Psychiatric  History: See HPI Social History: See HPI  Additional Social History:                         Sleep: Fair  Appetite:  Fair  Current Medications:  Current Facility-Administered Medications  Medication Dose Route Frequency Provider Last Rate Last Admin   acetaminophen (TYLENOL) tablet 650 mg  650 mg Oral Q6H PRN Rankin, Shuvon B, NP       alum & mag hydroxide-simeth (MAALOX/MYLANTA) 200-200-20 MG/5ML suspension 30 mL  30 mL Oral Q4H PRN Rankin, Shuvon B, NP       atorvastatin (LIPITOR) tablet 10 mg  10 mg Oral Daily Rankin, Shuvon B, NP   10 mg at 03/04/23 0937   gabapentin (NEURONTIN) capsule 300 mg  300 mg Oral BID Rankin, Shuvon B, NP   300 mg at 03/04/23 0937   glipiZIDE (GLUCOTROL) tablet 5 mg  5 mg Oral QAC breakfast Rankin, Shuvon B, NP   5 mg at 03/04/23 0814   insulin aspart (novoLOG) injection 0-15 Units  0-15 Units Subcutaneous TID WC Rankin, Shuvon B, NP   8 Units at 03/03/23 1702   insulin aspart (novoLOG) injection 0-5 Units  0-5 Units Subcutaneous QHS Rankin, Shuvon B, NP       insulin aspart (novoLOG) injection 10 Units  10 Units Subcutaneous TID WC Rankin, Shuvon B, NP  10 Units at 03/04/23 0818   insulin glargine-yfgn (SEMGLEE) injection 20 Units  20 Units Subcutaneous Daily Rankin, Shuvon B, NP   20 Units at 03/04/23 0937   polyethylene glycol (MIRALAX / GLYCOLAX) packet 17 g  17 g Oral Daily PRN Ajibola, Ene A, NP   17 g at 03/03/23 2138   traZODone (DESYREL) tablet 50 mg  50 mg Oral QHS PRN Rankin, Shuvon B, NP   50 mg at 03/03/23 2138   Current Outpatient Medications  Medication Sig Dispense Refill   atorvastatin (LIPITOR)  10 MG tablet TAKE 1 TABLET (10 MG TOTAL) BY MOUTH DAILY. (Patient not taking: Reported on 03/02/2023) 30 tablet 11   gabapentin (NEURONTIN) 300 MG capsule Take 1 capsule (300 mg total) by mouth 2 (two) times daily. (Patient not taking: Reported on 03/02/2023) 60 capsule 2   glipiZIDE (GLUCOTROL) 5 MG tablet Take 1 tablet (5 mg total) by mouth daily before breakfast. (Patient not taking: Reported on 03/02/2023) 60 tablet 0   hydrOXYzine (ATARAX) 25 MG tablet Take 1 tablet (25 mg total) by mouth 3 (three) times daily as needed for anxiety. 30 tablet 0   insulin aspart (NOVOLOG) 100 UNIT/ML injection Inject 10 Units into the skin 3 (three) times daily with meals. 10 mL 11   insulin aspart (NOVOLOG) 100 UNIT/ML injection Inject 0-15 Units into the skin 3 (three) times daily with meals. 10 mL 11   insulin aspart (NOVOLOG) 100 UNIT/ML injection Inject 0-5 Units into the skin at bedtime. 10 mL 11   Insulin Glargine (BASAGLAR KWIKPEN) 100 UNIT/ML Inject 15 Units into the skin at bedtime. (Patient taking differently: Inject 20 Units into the skin at bedtime.) 15 mL 1   insulin glargine-yfgn (SEMGLEE) 100 UNIT/ML injection Inject 0.2 mLs (20 Units total) into the skin daily. 10 mL 11   traZODone (DESYREL) 50 MG tablet Take 1 tablet (50 mg total) by mouth at bedtime as needed for sleep.      Labs  Lab Results:  Admission on 03/03/2023  Component Date Value Ref Range Status   Glucose-Capillary 03/03/2023 287 (H)  70 - 99 mg/dL Final   Glucose reference range applies only to samples taken after fasting for at least 8 hours.   Glucose-Capillary 03/04/2023 252 (H)  70 - 99 mg/dL Final   Glucose reference range applies only to samples taken after fasting for at least 8 hours.  Admission on 03/01/2023, Discharged on 03/03/2023  Component Date Value Ref Range Status   WBC 03/02/2023 5.7  4.0 - 10.5 K/uL Final   RBC 03/02/2023 4.71  4.22 - 5.81 MIL/uL Final   Hemoglobin 03/02/2023 13.7  13.0 - 17.0 g/dL Final    HCT 95/62/1308 43.7  39.0 - 52.0 % Final   MCV 03/02/2023 92.8  80.0 - 100.0 fL Final   MCH 03/02/2023 29.1  26.0 - 34.0 pg Final   MCHC 03/02/2023 31.4  30.0 - 36.0 g/dL Final   RDW 65/78/4696 14.1  11.5 - 15.5 % Final   Platelets 03/02/2023 256  150 - 400 K/uL Final   nRBC 03/02/2023 0.0  0.0 - 0.2 % Final   Neutrophils Relative % 03/02/2023 53  % Final   Neutro Abs 03/02/2023 3.1  1.7 - 7.7 K/uL Final   Lymphocytes Relative 03/02/2023 32  % Final   Lymphs Abs 03/02/2023 1.8  0.7 - 4.0 K/uL Final   Monocytes Relative 03/02/2023 11  % Final   Monocytes Absolute 03/02/2023 0.6  0.1 - 1.0 K/uL  Final   Eosinophils Relative 03/02/2023 2  % Final   Eosinophils Absolute 03/02/2023 0.1  0.0 - 0.5 K/uL Final   Basophils Relative 03/02/2023 1  % Final   Basophils Absolute 03/02/2023 0.0  0.0 - 0.1 K/uL Final   Immature Granulocytes 03/02/2023 1  % Final   Abs Immature Granulocytes 03/02/2023 0.04  0.00 - 0.07 K/uL Final   Performed at [facility], [address] [place], [place] [address]   Sodium 03/02/2023 137  135 - 145 mmol/L Final   Potassium 03/02/2023 5.0  3.5 - 5.1 mmol/L Final   Chloride 03/02/2023 100  98 - 111 mmol/L Final   CO2 03/02/2023 27  22 - 32 mmol/L Final   Glucose, Bld 03/02/2023 424 (H)  70 - 99 mg/dL Final   Glucose reference range applies only to samples taken after fasting for at least 8 hours.   BUN 03/02/2023 14  6 - 20 mg/dL Final   Creatinine, Ser 03/02/2023 0.90  0.61 - 1.24 mg/dL Final   Calcium 65/78/4696 9.3  8.9 - 10.3 mg/dL Final   Total Protein 29/52/8413 6.5  6.5 - 8.1 g/dL Final   Albumin 24/40/1027 3.6  3.5 - 5.0 g/dL Final   AST 25/36/6440 13 (L)  15 - 41 U/L Final   ALT 03/02/2023 16  0 - 44 U/L Final   Alkaline Phosphatase 03/02/2023 65  38 - 126 U/L Final   Total Bilirubin 03/02/2023 0.5  0.3 - 1.2 mg/dL Final   GFR, Estimated 03/02/2023 >60  >60 mL/min Final   Comment: (NOTE) Calculated using the CKD-EPI Creatinine Equation (2021)     Anion gap 03/02/2023 10  5 - 15 Final   Performed at [facility], [address], [place], [place] [address]   Hgb A1c MFr Bld 03/02/2023 10.9 (H)  4.8 - 5.6 % Final   Comment: (NOTE) Pre diabetes:          5.7%-6.4%  Diabetes:              >6.4%  Glycemic control for   <7.0% adults with diabetes    Mean Plasma Glucose 03/02/2023 266.13  mg/dL Final   Performed at [facility], [address] [place], [place] [address]   Alcohol, Ethyl (B) 03/02/2023 <10  <10 mg/dL Final   Comment: (NOTE) Lowest detectable limit for serum alcohol is 10 mg/dL.  For medical purposes only. Performed at [facility], [address] [place], [place] [address]    TSH 03/02/2023 1.178  0.350 - 4.500 uIU/mL Final   Comment: Performed by a 3rd Generation assay with a functional sensitivity of <=0.01 uIU/mL. Performed at [facility], [address], [place], [place] [address]    POC Amphetamine UR 03/02/2023 None Detected  NONE DETECTED (Cut Off Level 1000 ng/mL) Final   POC Secobarbital (BAR) 03/02/2023 None Detected  NONE DETECTED (Cut Off Level 300 ng/mL) Final   POC Buprenorphine (BUP) 03/02/2023 None Detected  NONE DETECTED (Cut Off Level 10 ng/mL) Final   POC Oxazepam (BZO) 03/02/2023 None Detected  NONE DETECTED (Cut Off Level 300 ng/mL) Final   POC Cocaine UR 03/02/2023 Positive (A)  NONE DETECTED (Cut Off Level 300 ng/mL) Final   POC Methamphetamine UR 03/02/2023 None Detected  NONE DETECTED (Cut Off Level 1000 ng/mL) Final   POC Morphine 03/02/2023 None Detected  NONE DETECTED (Cut Off Level 300 ng/mL) Final   POC Methadone UR 03/02/2023 None Detected  NONE DETECTED (Cut Off Level 300 ng/mL) Final   POC Oxycodone UR 03/02/2023 None Detected  NONE DETECTED (Cut Off Level 100 ng/mL) Final   POC Marijuana UR 03/02/2023 Positive (A)  NONE DETECTED (Cut Off Level 50 ng/mL) Final   Glucose-Capillary 03/02/2023 499 (H)  70 - 99 mg/dL Final   Glucose reference range applies  only to samples taken after fasting for at least 8 hours.   Glucose-Capillary 03/02/2023 404 (H)  70 - 99 mg/dL Final   Glucose reference range applies only to samples taken after fasting for at least 8 hours.   Glucose-Capillary 03/02/2023 214 (H)  70 - 99 mg/dL Final   Glucose reference range applies only to samples taken after fasting for at least 8 hours.  Admission on 02/05/2023, Discharged on 02/05/2023  Component Date Value Ref Range Status   Glucose-Capillary 02/05/2023 380 (H)  70 - 99 mg/dL Final   Glucose reference range applies only to samples taken after fasting for at least 8 hours.   Sodium 02/05/2023 138  135 - 145 mmol/L Final   Potassium 02/05/2023 4.0  3.5 - 5.1 mmol/L Final   Chloride 02/05/2023 103  98 - 111 mmol/L Final   CO2 02/05/2023 26  22 - 32 mmol/L Final   Glucose, Bld 02/05/2023 380 (H)  70 - 99 mg/dL Final   Glucose reference range applies only to samples taken after fasting for at least 8 hours.   BUN 02/05/2023 11  6 - 20 mg/dL Final   Creatinine, Ser 02/05/2023 0.92  0.61 - 1.24 mg/dL Final   Calcium 82/95/6213 9.1  8.9 - 10.3 mg/dL Final   GFR, Estimated 02/05/2023 >60  >60 mL/min Final   Comment: (NOTE) Calculated using the CKD-EPI Creatinine Equation (2021)    Anion gap 02/05/2023 9  5 - 15 Final   Performed at [facility], [address], [place], [place] [address]   WBC 02/05/2023 7.2  4.0 - 10.5 K/uL Final   RBC 02/05/2023 5.40  4.22 - 5.81 MIL/uL Final   Hemoglobin 02/05/2023 16.0  13.0 - 17.0 g/dL Final   HCT 84/69/6295 49.7  39.0 - 52.0 % Final   MCV 02/05/2023 92.0  80.0 - 100.0 fL Final   MCH 02/05/2023 29.6  26.0 - 34.0 pg Final   MCHC 02/05/2023 32.2  30.0 - 36.0 g/dL Final   RDW 28/41/3244 13.9  11.5 - 15.5 % Final   Platelets 02/05/2023 266  150 - 400 K/uL Final   nRBC 02/05/2023 0.0  0.0 - 0.2 % Final   Performed at [facility], [address], [place], [place] [address]   Glucose-Capillary 02/05/2023 342 (H)  70 - 99  mg/dL Final   Glucose reference range applies only to samples taken after fasting for at least 8 hours.   Total Protein 02/05/2023 7.5  6.5 - 8.1 g/dL Final   Albumin 25/36/6440 4.1  3.5 - 5.0 g/dL Final   AST 34/74/2595 16  15 - 41 U/L Final   ALT 02/05/2023 12  0 - 44 U/L Final   Alkaline Phosphatase 02/05/2023 83  38 - 126 U/L Final   Total Bilirubin 02/05/2023 1.2  0.3 - 1.2 mg/dL Final   Bilirubin, Direct 02/05/2023 0.3 (H)  0.0 - 0.2 mg/dL Final   Indirect Bilirubin 02/05/2023 0.9  0.3 - 0.9 mg/dL Final   Performed at [facility], [address], [place], [place] [address]   Lipase 02/05/2023 36  11 -  51 U/L Final   Performed at [facility], [address] [place], [place] [address]   pH, Ven 02/05/2023 7.352  7.25 - 7.43 Final   pCO2, Ven 02/05/2023 48.6  44 - 60 mmHg Final   pO2, Ven 02/05/2023 28 (LL)  32 - 45 mmHg Final   Bicarbonate 02/05/2023 26.9  20.0 - 28.0 mmol/L Final   TCO2 02/05/2023 28  22 - 32 mmol/L Final   O2 Saturation 02/05/2023 49  % Final   Acid-Base Excess 02/05/2023 0.0  0.0 - 2.0 mmol/L Final   Sodium 02/05/2023 140  135 - 145 mmol/L Final   Potassium 02/05/2023 3.9  3.5 - 5.1 mmol/L Final   Calcium, Ion 02/05/2023 1.22  1.15 - 1.40 mmol/L Final   HCT 02/05/2023 50.0  39.0 - 52.0 % Final   Hemoglobin 02/05/2023 17.0  13.0 - 17.0 g/dL Final   Sample type 19/14/7829 VENOUS   Final   Comment 02/05/2023 NOTIFIED PHYSICIAN   Final   Beta-Hydroxybutyric Acid 02/05/2023 0.21  0.05 - 0.27 mmol/L Final   Performed at [facility], [address], [place], [place] [address]   Glucose-Capillary 02/05/2023 213 (H)  70 - 99 mg/dL Final   Glucose reference range applies only to samples taken after fasting for at least 8 hours.  Admission on 01/27/2023, Discharged on 01/27/2023  Component Date Value Ref Range Status   Glucose-Capillary 01/27/2023 454 (H)  70 - 99 mg/dL Final   Glucose reference range applies only to samples taken after fasting for  at least 8 hours.   WBC 01/27/2023 5.3  4.0 - 10.5 K/uL Final   RBC 01/27/2023 4.77  4.22 - 5.81 MIL/uL Final   Hemoglobin 01/27/2023 14.2  13.0 - 17.0 g/dL Final   HCT 08/65/7846 43.5  39.0 - 52.0 % Final   MCV 01/27/2023 91.2  80.0 - 100.0 fL Final   MCH 01/27/2023 29.8  26.0 - 34.0 pg Final   MCHC 01/27/2023 32.6  30.0 - 36.0 g/dL Final   RDW 96/29/5284 13.9  11.5 - 15.5 % Final   Platelets 01/27/2023 238  150 - 400 K/uL Final   nRBC 01/27/2023 0.0  0.0 - 0.2 % Final   Neutrophils Relative % 01/27/2023 68  % Final   Neutro Abs 01/27/2023 3.5  1.7 - 7.7 K/uL Final   Lymphocytes Relative 01/27/2023 23  % Final   Lymphs Abs 01/27/2023 1.2  0.7 - 4.0 K/uL Final   Monocytes Relative 01/27/2023 9  % Final   Monocytes Absolute 01/27/2023 0.5  0.1 - 1.0 K/uL Final   Eosinophils Relative 01/27/2023 0  % Final   Eosinophils Absolute 01/27/2023 0.0  0.0 - 0.5 K/uL Final   Basophils Relative 01/27/2023 0  % Final   Basophils Absolute 01/27/2023 0.0  0.0 - 0.1 K/uL Final   Immature Granulocytes 01/27/2023 0  % Final   Abs Immature Granulocytes 01/27/2023 0.02  0.00 - 0.07 K/uL Final   Performed at [facility], [address] [place], [place] [address]   Sodium 01/27/2023 133 (L)  135 - 145 mmol/L Final   Potassium 01/27/2023 4.5  3.5 - 5.1 mmol/L Final   Chloride 01/27/2023 99  98 - 111 mmol/L Final   CO2 01/27/2023 25  22 - 32 mmol/L Final   Glucose, Bld 01/27/2023 410 (H)  70 - 99 mg/dL Final   Glucose reference range applies only to samples taken after fasting for at least 8 hours.  BUN 01/27/2023 17  6 - 20 mg/dL Final   Creatinine, Ser 01/27/2023 0.89  0.61 - 1.24 mg/dL Final   Calcium 91/47/8295 8.5 (L)  8.9 - 10.3 mg/dL Final   Total Protein 62/13/0865 6.8  6.5 - 8.1 g/dL Final   Albumin 78/46/9629 3.6  3.5 - 5.0 g/dL Final   AST 52/84/1324 19  15 - 41 U/L Final   ALT 01/27/2023 15  0 - 44 U/L Final   Alkaline Phosphatase 01/27/2023 60  38 - 126 U/L Final    Total Bilirubin 01/27/2023 0.7  0.3 - 1.2 mg/dL Final   GFR, Estimated 01/27/2023 >60  >60 mL/min Final   Comment: (NOTE) Calculated using the CKD-EPI Creatinine Equation (2021)    Anion gap 01/27/2023 9  5 - 15 Final   Performed at [facility], [address] [place], [place] [address]   Glucose-Capillary 01/27/2023 339 (H)  70 - 99 mg/dL Final   Glucose reference range applies only to samples taken after fasting for at least 8 hours.  Admission on 12/23/2022, Discharged on 12/23/2022  Component Date Value Ref Range Status   Glucose-Capillary 12/23/2022 339 (H)  70 - 99 mg/dL Final   Glucose reference range applies only to samples taken after fasting for at least 8 hours.   Comment 1 12/23/2022 Notify RN   Final   Sodium 12/23/2022 132 (L)  135 - 145 mmol/L Final   Potassium 12/23/2022 4.0  3.5 - 5.1 mmol/L Final   Chloride 12/23/2022 102  98 - 111 mmol/L Final   CO2 12/23/2022 26  22 - 32 mmol/L Final   Glucose, Bld 12/23/2022 351 (H)  70 - 99 mg/dL Final   Glucose reference range applies only to samples taken after fasting for at least 8 hours.   BUN 12/23/2022 13  6 - 20 mg/dL Final   Creatinine, Ser 12/23/2022 0.70  0.61 - 1.24 mg/dL Final   Calcium 72/53/6644 8.3 (L)  8.9 - 10.3 mg/dL Final   GFR, Estimated 12/23/2022 >60  >60 mL/min Final   Comment: (NOTE) Calculated using the CKD-EPI Creatinine Equation (2021)    Anion gap 12/23/2022 4 (L)  5 - 15 Final   Performed at [facility], [address] [place], [place] [address]   WBC 12/23/2022 6.9  4.0 - 10.5 K/uL Final   RBC 12/23/2022 4.60  4.22 - 5.81 MIL/uL Final   Hemoglobin 12/23/2022 13.6  13.0 - 17.0 g/dL Final   HCT 25/95/6387 42.7  39.0 - 52.0 % Final   MCV 12/23/2022 92.8  80.0 - 100.0 fL Final   MCH 12/23/2022 29.6  26.0 - 34.0 pg Final   MCHC 12/23/2022 31.9  30.0 - 36.0 g/dL Final   RDW 56/43/3295 13.4  11.5 - 15.5 % Final   Platelets 12/23/2022 268  150 - 400 K/uL Final    nRBC 12/23/2022 0.0  0.0 - 0.2 % Final   Performed at [facility], [address], [place], [place] [address]   Color, Urine 12/23/2022 YELLOW  YELLOW Final   APPearance 12/23/2022 CLEAR  CLEAR Final   Specific Gravity, Urine 12/23/2022 1.026  1.005 - 1.030 Final   pH 12/23/2022 7.0  5.0 - 8.0 Final   Glucose, UA 12/23/2022 >=500 (A)  NEGATIVE mg/dL Final   Hgb urine dipstick 12/23/2022 NEGATIVE  NEGATIVE Final   Bilirubin Urine 12/23/2022 NEGATIVE  NEGATIVE Final   Ketones, ur 12/23/2022 NEGATIVE  NEGATIVE mg/dL Final   Protein,  ur 12/23/2022 NEGATIVE  NEGATIVE mg/dL Final   Nitrite 81/19/1478 NEGATIVE  NEGATIVE Final   Leukocytes,Ua 12/23/2022 NEGATIVE  NEGATIVE Final   RBC / HPF 12/23/2022 0-5  0 - 5 RBC/hpf Final   WBC, UA 12/23/2022 0-5  0 - 5 WBC/hpf Final   Bacteria, UA 12/23/2022 NONE SEEN  NONE SEEN Final   Squamous Epithelial / HPF 12/23/2022 0-5  0 - 5 /HPF Final   Performed at [facility], [address], [place], [place] [address]   Glucose-Capillary 12/23/2022 295 (H)  70 - 99 mg/dL Final   Glucose reference range applies only to samples taken after fasting for at least 8 hours.   Glucose-Capillary 12/23/2022 231 (H)  70 - 99 mg/dL Final   Glucose reference range applies only to samples taken after fasting for at least 8 hours.  Office Visit on 11/30/2022  Component Date Value Ref Range Status   Creatinine, Urine 11/30/2022 108.2  Not Estab. mg/dL Final   Microalbumin, Urine 11/30/2022 12.1  Not Estab. ug/mL Final   Microalb/Creat Ratio 11/30/2022 11  0 - 29 mg/g creat Final   Comment:                        Normal:                0 -  29                        Moderately increased: 30 - 300                        Severely increased:       >300    Glucose 11/30/2022 49 (L)  70 - 99 mg/dL Final   BUN 13/04/6577 11  6 - 24 mg/dL Final   Creatinine, Ser 11/30/2022 0.77  0.76 - 1.27 mg/dL Final   eGFR 46/96/2952 108  >59  mL/min/1.73 Final   BUN/Creatinine Ratio 11/30/2022 14  9 - 20 Final   Sodium 11/30/2022 144  134 - 144 mmol/L Final   Potassium 11/30/2022 5.2  3.5 - 5.2 mmol/L Final   Chloride 11/30/2022 104  96 - 106 mmol/L Final   CO2 11/30/2022 26  20 - 29 mmol/L Final   Calcium 11/30/2022 9.3  8.7 - 10.2 mg/dL Final   Total Protein 84/13/2440 7.0  6.0 - 8.5 g/dL Final   Albumin 07/08/2535 4.2  3.8 - 4.9 g/dL Final   Globulin, Total 11/30/2022 2.8  1.5 - 4.5 g/dL Final   Albumin/Globulin Ratio 11/30/2022 1.5  1.2 - 2.2 Final   Bilirubin Total 11/30/2022 0.2  0.0 - 1.2 mg/dL Final   Alkaline Phosphatase 11/30/2022 66  44 - 121 IU/L Final   AST 11/30/2022 16  0 - 40 IU/L Final   ALT 11/30/2022 13  0 - 44 IU/L Final   Hemoglobin A1C 11/30/2022 9.0 (A)  4.0 - 5.6 % Final   POC Glucose 11/30/2022 60 (A)  70 - 99 mg/dl Final   WBC 64/40/3474 7.7  3.4 - 10.8 x10E3/uL Final   RBC 11/30/2022 4.94  4.14 - 5.80 x10E6/uL Final   Hemoglobin 11/30/2022 14.6  13.0 - 17.7 g/dL Final   Hematocrit 25/95/6387 45.2  37.5 - 51.0 % Final   MCV 11/30/2022 92  79 - 97 fL Final   MCH 11/30/2022 29.6  26.6 - 33.0 pg Final   MCHC 11/30/2022 32.3  31.5 - 35.7 g/dL Final   RDW 47/82/9562 12.7  11.6 - 15.4 % Final   Platelets 11/30/2022 269  150 - 450 x10E3/uL Final   Neutrophils 11/30/2022 61  Not Estab. % Final   Lymphs 11/30/2022 28  Not Estab. % Final   Monocytes 11/30/2022 10  Not Estab. % Final   Eos 11/30/2022 1  Not Estab. % Final   Basos 11/30/2022 0  Not Estab. % Final   Neutrophils Absolute 11/30/2022 4.6  1.4 - 7.0 x10E3/uL Final   Lymphocytes Absolute 11/30/2022 2.2  0.7 - 3.1 x10E3/uL Final   Monocytes Absolute 11/30/2022 0.8  0.1 - 0.9 x10E3/uL Final   EOS (ABSOLUTE) 11/30/2022 0.1  0.0 - 0.4 x10E3/uL Final   Basophils Absolute 11/30/2022 0.0  0.0 - 0.2 x10E3/uL Final   Immature Granulocytes 11/30/2022 0  Not Estab. % Final   Immature Grans (Abs) 11/30/2022 0.0  0.0 - 0.1 x10E3/uL Final   Cholesterol,  Total 11/30/2022 112  100 - 199 mg/dL Final   Triglycerides 13/04/6577 82  0 - 149 mg/dL Final   HDL 46/96/2952 52  >39 mg/dL Final   VLDL Cholesterol Cal 11/30/2022 16  5 - 40 mg/dL Final   LDL Chol Calc (NIH) 11/30/2022 44  0 - 99 mg/dL Final   Chol/HDL Ratio 11/30/2022 2.2  0.0 - 5.0 ratio Final   Comment:                                   T. Chol/HDL Ratio                                             Men  Women                               1/2 Avg.Risk  3.4    3.3                                   Avg.Risk  5.0    4.4                                2X Avg.Risk  9.6    7.1                                3X Avg.Risk 23.4   11.0   Admission on 10/28/2022, Discharged on 10/28/2022  Component Date Value Ref Range Status   WBC 10/28/2022 5.9  4.0 - 10.5 K/uL Final   RBC 10/28/2022 4.74  4.22 - 5.81 MIL/uL Final   Hemoglobin 10/28/2022 14.2  13.0 - 17.0 g/dL Final   HCT 84/13/2440 44.3  39.0 - 52.0 % Final   MCV 10/28/2022 93.5  80.0 - 100.0 fL Final   MCH 10/28/2022 30.0  26.0 - 34.0 pg Final   MCHC 10/28/2022 32.1  30.0 - 36.0 g/dL Final   RDW 07/08/2535 13.9  11.5 - 15.5 % Final   Platelets 10/28/2022 244  150 - 400 K/uL Final   nRBC 10/28/2022 0.0  0.0 - 0.2 % Final  Neutrophils Relative % 10/28/2022 72  % Final   Neutro Abs 10/28/2022 4.3  1.7 - 7.7 K/uL Final   Lymphocytes Relative 10/28/2022 19  % Final   Lymphs Abs 10/28/2022 1.1  0.7 - 4.0 K/uL Final   Monocytes Relative 10/28/2022 8  % Final   Monocytes Absolute 10/28/2022 0.5  0.1 - 1.0 K/uL Final   Eosinophils Relative 10/28/2022 1  % Final   Eosinophils Absolute 10/28/2022 0.0  0.0 - 0.5 K/uL Final   Basophils Relative 10/28/2022 0  % Final   Basophils Absolute 10/28/2022 0.0  0.0 - 0.1 K/uL Final   Immature Granulocytes 10/28/2022 0  % Final   Abs Immature Granulocytes 10/28/2022 0.01  0.00 - 0.07 K/uL Final   Performed at [facility], [address], [place], [place] [address]   Lipase 10/28/2022 137 (H)  11  - 51 U/L Final   Performed at [facility], [address] [place], [place] [address]   Opiates 10/28/2022 NONE DETECTED  NONE DETECTED Final   Cocaine 10/28/2022 POSITIVE (A)  NONE DETECTED Final   Benzodiazepines 10/28/2022 NONE DETECTED  NONE DETECTED Final   Amphetamines 10/28/2022 NONE DETECTED  NONE DETECTED Final   Tetrahydrocannabinol 10/28/2022 NONE DETECTED  NONE DETECTED Final   Barbiturates 10/28/2022 NONE DETECTED  NONE DETECTED Final   Comment: (NOTE) DRUG SCREEN FOR MEDICAL PURPOSES ONLY.  IF CONFIRMATION IS NEEDED FOR ANY PURPOSE, NOTIFY LAB WITHIN 5 DAYS.  LOWEST DETECTABLE LIMITS FOR URINE DRUG SCREEN Drug Class                     Cutoff (ng/mL) Amphetamine and metabolites    1000 Barbiturate and metabolites    200 Benzodiazepine                 200 Opiates and metabolites        300 Cocaine and metabolites        300 THC                            50 Performed at [facility], [address] [place], [place] [address]    Alcohol, Ethyl (B) 10/28/2022 <10  <10 mg/dL Final   Comment: (NOTE) Lowest detectable limit for serum alcohol is 10 mg/dL.  For medical purposes only. Performed at [facility], [address] [place], [place] [address]    Sodium 10/28/2022 135  135 - 145 mmol/L Final   Potassium 10/28/2022 4.0  3.5 - 5.1 mmol/L Final   Chloride 10/28/2022 99  98 - 111 mmol/L Final   CO2 10/28/2022 23  22 - 32 mmol/L Final   Glucose, Bld 10/28/2022 273 (H)  70 - 99 mg/dL Final   Glucose reference range applies only to samples taken after fasting for at least 8 hours.   BUN 10/28/2022 7  6 - 20 mg/dL Final   Creatinine, Ser 10/28/2022 0.69  0.61 - 1.24 mg/dL Final   Calcium 29/56/2130 9.0  8.9 - 10.3 mg/dL Final   Total Protein 86/57/8469 7.2  6.5 - 8.1 g/dL Final   Albumin 62/95/2841 3.8  3.5 - 5.0 g/dL Final   AST 32/44/0102 22  15 - 41 U/L Final   ALT 10/28/2022 17  0 - 44 U/L Final   Alkaline Phosphatase 10/28/2022 65  38 -  126 U/L Final   Total Bilirubin 10/28/2022 0.3  0.3 - 1.2  mg/dL Final   GFR, Estimated 10/28/2022 >60  >60 mL/min Final   Comment: (NOTE) Calculated using the CKD-EPI Creatinine Equation (2021)    Anion gap 10/28/2022 13  5 - 15 Final   Performed at [facility], [address], [place], [place] [address]   Salicylate Lvl 10/28/2022 <7.0 (L)  7.0 - 30.0 mg/dL Final   Performed at [facility], [address] [place], [place] [address]   Acetaminophen (Tylenol), Serum 10/28/2022 <10 (L)  10 - 30 ug/mL Final   Comment: (NOTE) Therapeutic concentrations vary significantly. A range of 10-30 ug/mL  may be an effective concentration for many patients. However, some  are best treated at concentrations outside of this range. Acetaminophen concentrations >150 ug/mL at 4 hours after ingestion  and >50 ug/mL at 12 hours after ingestion are often associated with  toxic reactions.  Performed at [facility], [address], [place], [place] [address]   Admission on 10/10/2022, Discharged on 10/10/2022  Component Date Value Ref Range Status   Glucose-Capillary 10/10/2022 >600 (HH)  70 - 99 mg/dL Final   Glucose reference range applies only to samples taken after fasting for at least 8 hours.   Sodium 10/10/2022 129 (L)  135 - 145 mmol/L Final   Potassium 10/10/2022 4.4  3.5 - 5.1 mmol/L Final   Chloride 10/10/2022 95 (L)  98 - 111 mmol/L Final   CO2 10/10/2022 24  22 - 32 mmol/L Final   Glucose, Bld 10/10/2022 683 (HH)  70 - 99 mg/dL Final   Comment: CRITICAL RESULT CALLED TO, READ BACK BY AND VERIFIED WITH LAMB,S. RN AT 1219 10/10/22 MULLINS,T Glucose reference range applies only to samples taken after fasting for at least 8 hours.    BUN 10/10/2022 16  6 - 20 mg/dL Final   Creatinine, Ser 10/10/2022 0.93  0.61 - 1.24 mg/dL Final   Calcium 72/53/6644 8.4 (L)  8.9 - 10.3 mg/dL Final   Total Protein 03/47/4259 6.9  6.5 - 8.1 g/dL Final   Albumin 56/38/7564 3.6  3.5 - 5.0  g/dL Final   AST 33/29/5188 16  15 - 41 U/L Final   ALT 10/10/2022 11  0 - 44 U/L Final   Alkaline Phosphatase 10/10/2022 61  38 - 126 U/L Final   Total Bilirubin 10/10/2022 0.7  0.3 - 1.2 mg/dL Final   GFR, Estimated 10/10/2022 >60  >60 mL/min Final   Comment: (NOTE) Calculated using the CKD-EPI Creatinine Equation (2021)    Anion gap 10/10/2022 10  5 - 15 Final   Performed at [facility], [address] [place], [place] [address]   Color, Urine 10/10/2022 STRAW (A)  YELLOW Final   APPearance 10/10/2022 CLEAR  CLEAR Final   Specific Gravity, Urine 10/10/2022 1.031 (H)  1.005 - 1.030 Final   pH 10/10/2022 5.0  5.0 - 8.0 Final   Glucose, UA 10/10/2022 >=500 (A)  NEGATIVE mg/dL Final   Hgb urine dipstick 10/10/2022 NEGATIVE  NEGATIVE Final   Bilirubin Urine 10/10/2022 NEGATIVE  NEGATIVE Final   Ketones, ur 10/10/2022 NEGATIVE  NEGATIVE mg/dL Final   Protein, ur 63/09/6008 NEGATIVE  NEGATIVE mg/dL Final   Nitrite 93/23/5573 NEGATIVE  NEGATIVE Final   Leukocytes,Ua 10/10/2022 NEGATIVE  NEGATIVE Final   RBC / HPF 10/10/2022 0-5  0 - 5 RBC/hpf Final   WBC, UA 10/10/2022 0-5  0 - 5 WBC/hpf Final   Bacteria, UA 10/10/2022 NONE SEEN  NONE SEEN Final  Squamous Epithelial / HPF 10/10/2022 0-5  0 - 5 /HPF Final   Performed at [facility], [address], [place], [place] [address]   Beta-Hydroxybutyric Acid 10/10/2022 0.41 (H)  0.05 - 0.27 mmol/L Final   Performed at [facility], [address] [place], [place] [address]   pH, Ven 10/10/2022 7.39  7.25 - 7.43 Final   pCO2, Ven 10/10/2022 42 (L)  44 - 60 mmHg Final   pO2, Ven 10/10/2022 59 (H)  32 - 45 mmHg Final   Bicarbonate 10/10/2022 25.4  20.0 - 28.0 mmol/L Final   Acid-Base Excess 10/10/2022 0.3  0.0 - 2.0 mmol/L Final   O2 Saturation 10/10/2022 92.1  % Final   Patient temperature 10/10/2022 37.0   Final   Performed at [facility], [address],  [place], [place] [address]   Osmolality 10/10/2022 315 (H)  275 - 295 mOsm/kg Final   Comment: REPEATED TO VERIFY Performed at [facility], [address] [place], [place] [address]    WBC 10/10/2022 5.3  4.0 - 10.5 K/uL Final   RBC 10/10/2022 4.89  4.22 - 5.81 MIL/uL Final   Hemoglobin 10/10/2022 14.7  13.0 - 17.0 g/dL Final   HCT 29/56/2130 44.6  39.0 - 52.0 % Final   MCV 10/10/2022 91.2  80.0 - 100.0 fL Final   MCH 10/10/2022 30.1  26.0 - 34.0 pg Final   MCHC 10/10/2022 33.0  30.0 - 36.0 g/dL Final   RDW 86/57/8469 13.1  11.5 - 15.5 % Final   Platelets 10/10/2022 235  150 - 400 K/uL Final   nRBC 10/10/2022 0.0  0.0 - 0.2 % Final   Performed at [facility], [address] [place], [place] [address]   Glucose-Capillary 10/10/2022 480 (H)  70 - 99 mg/dL Final   Glucose reference range applies only to samples taken after fasting for at least 8 hours.    Blood Alcohol level:  Lab Results  Component Value Date   ETH <10 03/02/2023   ETH <10 10/28/2022    Metabolic Disorder Labs: Lab Results  Component Value Date   HGBA1C 10.9 (H) 03/02/2023   MPG 266.13 03/02/2023   MPG 231.69 03/22/2021   No results found for: "PROLACTIN" Lab Results  Component Value Date   CHOL 112 11/30/2022   TRIG 82 11/30/2022   HDL 52 11/30/2022   CHOLHDL 2.2 11/30/2022   VLDL 21 04/10/2017   LDLCALC 44 11/30/2022   LDLCALC 99 03/24/2021    Therapeutic Lab Levels: No results found for: "LITHIUM" No results found for: "VALPROATE" No results found for: "CBMZ"  Physical Findings   GAD-7    Flowsheet Row Office Visit from 11/30/2022 in [facility]  Total GAD-7 Score 12      PHQ2-9    Flowsheet Row ED from 03/03/2023 in [facility] Office Visit from 11/30/2022 in [facility] Office Visit from 03/24/2021 in [facility] Office Visit from 08/23/2018 in  [facility] Office Visit from 05/08/2017 in [facility]  PHQ-2 Total Score 3 0 0 0 0  PHQ-9 Total Score 7 3 -- -- --      Flowsheet Row ED from 03/03/2023 in [facility] ED from 03/01/2023 in [facility] ED from 02/05/2023 in [facility] Emergency Department at [facility]  C-SSRS RISK CATEGORY No Risk High Risk No Risk        Musculoskeletal  Strength & Muscle Tone: within normal limits Gait & Station: normal Patient leans: N/A  Psychiatric Specialty Exam  Presentation  General Appearance:  Appropriate for Environment  Eye Contact: Good  Speech: Clear and Coherent  Speech Volume: Normal  Handedness: Right   Mood and Affect  Mood: Euthymic  Affect: Congruent   Thought Process  Thought Processes: Coherent  Descriptions of Associations:Intact  Orientation:Full (Time, Place and Person)  Thought Content:Logical  Diagnosis of Schizophrenia or Schizoaffective disorder in past: No    Hallucinations:Hallucinations: None  Ideas of Reference:None  Suicidal Thoughts:Suicidal Thoughts: No SI Passive Intent and/or Plan: Without Intent; Without Plan  Homicidal Thoughts:Homicidal Thoughts: No   Sensorium  Memory: Recent Good; Remote Good  Judgment: Fair  Insight: Fair   Art therapist  Concentration: Fair  Attention Span: Good  Recall: Good  Fund of Knowledge: Good  Language: Good   Psychomotor Activity  Psychomotor Activity: Psychomotor Activity: Normal   Assets  Assets: Social Support; Desire for Improvement   Sleep  Sleep: Sleep: Fair   Nutritional Assessment (For OBS and FBC admissions only) Has the patient had a weight loss or gain of 10 pounds or more in the last 3 months?: No Has the patient had a decrease in food intake/or appetite?: No Does the patient have dental problems?: No Does the patient have eating  habits or behaviors that may be indicators of an eating disorder including binging or inducing vomiting?: No Has the patient recently lost weight without trying?: 0 Has the patient been eating poorly because of a decreased appetite?: 0 Malnutrition Screening Tool Score: 0    Physical Exam  Physical Exam Vitals and nursing note reviewed.  Constitutional:      Appearance: Normal appearance.  Neurological:     Mental Status: He is alert and oriented to person, place, and time.  Psychiatric:        Mood and Affect: Mood normal.        Behavior: Behavior normal.        Thought Content: Thought content normal.    Review of Systems  Respiratory: Negative.    Cardiovascular: Negative.   Psychiatric/Behavioral:  Positive for substance abuse. Negative for depression.   All other systems reviewed and are negative.  Blood pressure (!) 124/92, pulse 73, temperature 98.1 F (36.7 C), temperature source Oral, resp. rate 16, SpO2 100 %. There is no height or weight on file to calculate BMI.  Treatment Plan Summary: Daily contact with patient to assess and evaluate symptoms and progress in treatment and Medication management  Continue her current treatment plan on 03/04/2023 as listed below except were noted.  Diabetes mellitus type 2: Substance-induced mood disorder: Polysubstance abuse:  Continue hydroxyzine 25 mg p.o. 3 times daily as needed Continue trazodone 50 mg p.o. nightly as needed Continue gabapentin 300 mg p.o. 3 times daily Continue glipizide 5 mg p.o. daily Continue insulin coverage Semglee 20 units daily Continue NovoLog 10 units meal coverage See chart for sliding scale  Patient encouraged to participate within the therapeutic milieu CSW to assist with discharge disposition  Oneta Rack, NP 03/04/2023 10:48 AM

## 2023-03-04 NOTE — ED Notes (Signed)
Patients CBG was 286

## 2023-03-04 NOTE — ED Notes (Signed)
Patient alert and oriented x 3. Denies SI/HI/AVH. Denies intent or plan to harm self or others. Routine conducted according to faculty protocol. Encourage patient to notify staff with any needs or concerns. Patient verbalized agreement and understanding. Will continue to monitor for safety. 

## 2023-03-04 NOTE — ED Notes (Signed)
Patient blood sugar is 182. Patient was given 3 units of novolog.

## 2023-03-05 ENCOUNTER — Other Ambulatory Visit: Payer: Self-pay

## 2023-03-05 ENCOUNTER — Encounter (HOSPITAL_COMMUNITY): Payer: Self-pay

## 2023-03-05 DIAGNOSIS — R45851 Suicidal ideations: Secondary | ICD-10-CM | POA: Diagnosis not present

## 2023-03-05 DIAGNOSIS — Z560 Unemployment, unspecified: Secondary | ICD-10-CM | POA: Diagnosis not present

## 2023-03-05 DIAGNOSIS — F1410 Cocaine abuse, uncomplicated: Secondary | ICD-10-CM | POA: Diagnosis not present

## 2023-03-05 DIAGNOSIS — F4381 Prolonged grief disorder: Secondary | ICD-10-CM

## 2023-03-05 DIAGNOSIS — Z5900 Homelessness unspecified: Secondary | ICD-10-CM | POA: Diagnosis not present

## 2023-03-05 LAB — GLUCOSE, CAPILLARY
Glucose-Capillary: 265 mg/dL — ABNORMAL HIGH (ref 70–99)
Glucose-Capillary: 263 mg/dL — ABNORMAL HIGH (ref 70–99)
Glucose-Capillary: 258 mg/dL — ABNORMAL HIGH (ref 70–99)
Glucose-Capillary: 175 mg/dL — ABNORMAL HIGH (ref 70–99)
Glucose-Capillary: 182 mg/dL — ABNORMAL HIGH (ref 70–99)
Glucose-Capillary: 254 mg/dL — ABNORMAL HIGH (ref 70–99)
Glucose-Capillary: 163 mg/dL — ABNORMAL HIGH (ref 70–99)
Glucose-Capillary: 242 mg/dL — ABNORMAL HIGH (ref 70–99)
Glucose-Capillary: 387 mg/dL — ABNORMAL HIGH (ref 70–99)

## 2023-03-05 MED ORDER — NICOTINE 14 MG/24HR TD PT24
14.0000 mg | MEDICATED_PATCH | Freq: Every day | TRANSDERMAL | Status: DC | PRN
  Filled 2023-03-05: qty 14

## 2023-03-05 NOTE — ED Notes (Signed)
Patient has been participating in groups. Patient has spent the morning in the dayroom interacting with the other patients. Patient has been calm and cooperative with staff and the patients on the unit. Patient denies SI/HI and AVH. Patient has eaten breakfast and received his scheduled medications. Patient will be monitored for safety.

## 2023-03-05 NOTE — ED Notes (Signed)
Pt was provided with dinner 

## 2023-03-05 NOTE — ED Notes (Signed)
Pt was provided with breakfast.

## 2023-03-05 NOTE — Group Note (Signed)
Group Topic: Relapse and Recovery  Group Date: 03/05/2023 Start Time: 1000 End Time: 1105 Facilitators: Londell Moh, NT  Department: [facility]  Number of Participants: 3  Group Focus: coping skills and daily focus Treatment Modality:  Psychoeducation Interventions utilized were patient education Purpose: increase insight  Name: Dakota Kim Date of Birth: [birth year]  MR: [id number]    Level of Participation: active Quality of Participation: attentive Interactions with others: gave feedback Mood/Affect: positive Triggers (if applicable): n/a Cognition: insightful Progress: Gaining insight Response: Pt was engaged during AA group and shared with others. Plan: patient will be encouraged to continue to attend future groups.  Patients Problems:  Patient Active Problem List   Diagnosis Date Noted   Polysubstance abuse (HCC) 03/03/2023   Substance induced mood disorder (HCC) 03/03/2023   Foot infection 11/30/2022   Hyperlipidemia 02/26/2019   Class 2 severe obesity due to excess calories with serious comorbidity and body mass index (BMI) of 35.0 to 35.9 in adult [facility]) 02/26/2019   Type 2 diabetes mellitus without complication, with long-term current use of insulin (HCC) 05/13/2017   Hypokalemia 11/06/2012

## 2023-03-05 NOTE — ED Notes (Signed)
Patient is sleeping. Respirations equal and unlabored, skin warm and dry. No change in assessment or acuity. Routine safety checks conducted according to facility protocol. Will continue to monitor for safety.   

## 2023-03-05 NOTE — Group Note (Signed)
Group Topic: Understanding Self  Group Date: 03/05/2023 Start Time: 0130 End Time: 0245 Facilitators: Lenny Pastel  Department: [facility]  Number of Participants: 4  Group Focus: clarity of thought Treatment Modality:  Cognitive Behavioral Therapy Interventions utilized were clarification, exploration, group exercise, mental fitness, story telling, and support Purpose: enhance coping skills, explore maladaptive thinking, express feelings, express irrational fears, improve communication skills, increase insight, regain self-worth, reinforce self-care, relapse prevention strategies, and trigger / craving management  Name: Dakota Kim Date of Birth: 05-18-1971  MR: [id number]    Level of Participation: active Quality of Participation: attentive, cooperative, engaged, and initiates communication Interactions with others: gave feedback Mood/Affect: appropriate Triggers (if applicable): money Cognition: coherent/clear, insightful, and logical Progress: Gaining insight Response: Patient participated in group on today. Patient expressed understanding of group rules and confidentiality. Patient was able to mention his first thought regarding certain words and stigmas attached to mental health and substance use. Patient reports this activity challenged him to think differently about his current circumstance and to reconsider options for coping with said stigmas. Patient reported that money has been his biggest trigger to substance use. Patient reports when he has no access to money, he does not even think about substances. Patient reports the moment he gets paid then he attempts to save or put additional funds away, however finds himself going back trying to get more for substances. Patient reports he has learned a plethora of skills, however reports his issue or struggle has been utilizing what he has been taught. Supportive feedback was provided to the patient  and he was receptive to the feedback provided. No issues to report.  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

## 2023-03-05 NOTE — ED Notes (Signed)
Pt is currently sleeping, no distress noted, environmental check complete, will continue to monitor patient for safety.  

## 2023-03-05 NOTE — ED Notes (Signed)
Pt was provided with lunch.

## 2023-03-05 NOTE — ED Provider Notes (Signed)
Behavioral Health Progress Note  Date and Time: 03/05/2023 2:17 PM Name: Dakota Kim MRN:  [id number]  Subjective:  Dakota Kim is a 52 year old male with no formal psychiatric diagnoses prior to admission but who meets criteria for cocaine use disorder, nicotine use disorder, and prolonged grief who presented voluntarily to [facility] for detox and substance use treatment, for which he was admitted to [facility].  On assessment today, patient continues to insist he is interested in seeking a 28-30 day rehabilitation program such as [facility] or a longer residential treatment program such as the [facility] or [facility]; he would preferably like placement outside of [place].  Dakota Kim does endorse depressive symptoms, but these are also symptoms of increased crack cocaine use (poor sleep, poor appetite, anhedonia, decreased motivation). What is clear is that the loss of his father in 2021 triggered a relapse that has been ongoing since. Patient has not had grief counseling and becomes tearful when speaking about his relationship with his father. He says that losing his father made him not care about his own life, but he has now grown tired of the cycle in which he is living and wants help with regaining control of his life.  He declines psychotropic medications to help with his mood but is open to therapy upon discharge. Patient denies SI, HI, and AVH. He denies symptoms of mania/hypomania. He has a history of PTSD symptoms, but denies them currently.    Diagnosis:  Final diagnoses:  Substance induced mood disorder (HCC)  Polysubstance abuse (HCC)  Prolonged grief disorder    Total Time spent with patient: 15 minutes  Past Psychiatric History: See HPI Past Medical History: See HPI  family History: See HPI Family Psychiatric  History: See HPI Social History: See HPI  Additional Social History:                         Sleep: Fair, improving  Appetite:  Fair,  improving  Current Medications:  Current Facility-Administered Medications  Medication Dose Route Frequency Provider Last Rate Last Admin   acetaminophen (TYLENOL) tablet 650 mg  650 mg Oral Q6H PRN Rankin, Shuvon B, NP       alum & mag hydroxide-simeth (MAALOX/MYLANTA) 200-200-20 MG/5ML suspension 30 mL  30 mL Oral Q4H PRN Rankin, Shuvon B, NP       atorvastatin (LIPITOR) tablet 10 mg  10 mg Oral Daily Rankin, Shuvon B, NP   10 mg at 03/05/23 0931   gabapentin (NEURONTIN) capsule 300 mg  300 mg Oral BID Rankin, Shuvon B, NP   300 mg at 03/05/23 0931   glipiZIDE (GLUCOTROL) tablet 5 mg  5 mg Oral QAC breakfast Rankin, Shuvon B, NP   5 mg at 03/05/23 0813   insulin aspart (novoLOG) injection 0-15 Units  0-15 Units Subcutaneous TID WC Rankin, Shuvon B, NP   3 Units at 03/05/23 1201   insulin aspart (novoLOG) injection 0-5 Units  0-5 Units Subcutaneous QHS Rankin, Shuvon B, NP   3 Units at 03/04/23 2110   insulin glargine-yfgn (SEMGLEE) injection 20 Units  20 Units Subcutaneous Daily Rankin, Shuvon B, NP   20 Units at 03/05/23 0928   polyethylene glycol (MIRALAX / GLYCOLAX) packet 17 g  17 g Oral Daily PRN Ajibola, Ene A, NP   17 g at 03/03/23 2138   traZODone (DESYREL) tablet 50 mg  50 mg Oral QHS PRN Rankin, Shuvon B, NP   50 mg at 03/04/23 2110  Current Outpatient Medications  Medication Sig Dispense Refill   atorvastatin (LIPITOR) 10 MG tablet TAKE 1 TABLET (10 MG TOTAL) BY MOUTH DAILY. (Patient not taking: Reported on 03/02/2023) 30 tablet 11   gabapentin (NEURONTIN) 300 MG capsule Take 1 capsule (300 mg total) by mouth 2 (two) times daily. (Patient not taking: Reported on 03/02/2023) 60 capsule 2   glipiZIDE (GLUCOTROL) 5 MG tablet Take 1 tablet (5 mg total) by mouth daily before breakfast. (Patient not taking: Reported on 03/02/2023) 60 tablet 0   hydrOXYzine (ATARAX) 25 MG tablet Take 1 tablet (25 mg total) by mouth 3 (three) times daily as needed for anxiety. 30 tablet 0   insulin aspart  (NOVOLOG) 100 UNIT/ML injection Inject 10 Units into the skin 3 (three) times daily with meals. 10 mL 11   insulin aspart (NOVOLOG) 100 UNIT/ML injection Inject 0-15 Units into the skin 3 (three) times daily with meals. 10 mL 11   insulin aspart (NOVOLOG) 100 UNIT/ML injection Inject 0-5 Units into the skin at bedtime. 10 mL 11   Insulin Glargine (BASAGLAR KWIKPEN) 100 UNIT/ML Inject 15 Units into the skin at bedtime. (Patient taking differently: Inject 20 Units into the skin at bedtime.) 15 mL 1   insulin glargine-yfgn (SEMGLEE) 100 UNIT/ML injection Inject 0.2 mLs (20 Units total) into the skin daily. 10 mL 11   traZODone (DESYREL) 50 MG tablet Take 1 tablet (50 mg total) by mouth at bedtime as needed for sleep.      Labs  Lab Results:  Admission on 03/03/2023  Component Date Value Ref Range Status   Glucose-Capillary 03/03/2023 287 (H)  70 - 99 mg/dL Final   Glucose reference range applies only to samples taken after fasting for at least 8 hours.   Glucose-Capillary 03/04/2023 252 (H)  70 - 99 mg/dL Final   Glucose reference range applies only to samples taken after fasting for at least 8 hours.   Glucose-Capillary 03/04/2023 341 (H)  70 - 99 mg/dL Final   Glucose reference range applies only to samples taken after fasting for at least 8 hours.   Glucose-Capillary 03/04/2023 286 (H)  70 - 99 mg/dL Final   Glucose reference range applies only to samples taken after fasting for at least 8 hours.   Glucose-Capillary 03/03/2023 175 (H)  70 - 99 mg/dL Final   Glucose reference range applies only to samples taken after fasting for at least 8 hours.   Glucose-Capillary 03/04/2023 182 (H)  70 - 99 mg/dL Final   Glucose reference range applies only to samples taken after fasting for at least 8 hours.   Glucose-Capillary 03/05/2023 254 (H)  70 - 99 mg/dL Final   Glucose reference range applies only to samples taken after fasting for at least 8 hours.   Glucose-Capillary 03/05/2023 163 (H)  70 - 99  mg/dL Final   Glucose reference range applies only to samples taken after fasting for at least 8 hours.  Admission on 03/01/2023, Discharged on 03/03/2023  Component Date Value Ref Range Status   WBC 03/02/2023 5.7  4.0 - 10.5 K/uL Final   RBC 03/02/2023 4.71  4.22 - 5.81 MIL/uL Final   Hemoglobin 03/02/2023 13.7  13.0 - 17.0 g/dL Final   HCT 74/25/9563 43.7  39.0 - 52.0 % Final   MCV 03/02/2023 92.8  80.0 - 100.0 fL Final   MCH 03/02/2023 29.1  26.0 - 34.0 pg Final   MCHC 03/02/2023 31.4  30.0 - 36.0 g/dL Final   RDW 87/56/4332  14.1  11.5 - 15.5 % Final   Platelets 03/02/2023 256  150 - 400 K/uL Final   nRBC 03/02/2023 0.0  0.0 - 0.2 % Final   Neutrophils Relative % 03/02/2023 53  % Final   Neutro Abs 03/02/2023 3.1  1.7 - 7.7 K/uL Final   Lymphocytes Relative 03/02/2023 32  % Final   Lymphs Abs 03/02/2023 1.8  0.7 - 4.0 K/uL Final   Monocytes Relative 03/02/2023 11  % Final   Monocytes Absolute 03/02/2023 0.6  0.1 - 1.0 K/uL Final   Eosinophils Relative 03/02/2023 2  % Final   Eosinophils Absolute 03/02/2023 0.1  0.0 - 0.5 K/uL Final   Basophils Relative 03/02/2023 1  % Final   Basophils Absolute 03/02/2023 0.0  0.0 - 0.1 K/uL Final   Immature Granulocytes 03/02/2023 1  % Final   Abs Immature Granulocytes 03/02/2023 0.04  0.00 - 0.07 K/uL Final   Performed at [facility], [address] [place], [place] [address]   Sodium 03/02/2023 137  135 - 145 mmol/L Final   Potassium 03/02/2023 5.0  3.5 - 5.1 mmol/L Final   Chloride 03/02/2023 100  98 - 111 mmol/L Final   CO2 03/02/2023 27  22 - 32 mmol/L Final   Glucose, Bld 03/02/2023 424 (H)  70 - 99 mg/dL Final   Glucose reference range applies only to samples taken after fasting for at least 8 hours.   BUN 03/02/2023 14  6 - 20 mg/dL Final   Creatinine, Ser 03/02/2023 0.90  0.61 - 1.24 mg/dL Final   Calcium 38/75/6433 9.3  8.9 - 10.3 mg/dL Final   Total Protein 29/51/8841 6.5  6.5 - 8.1 g/dL Final   Albumin 66/02/3015 3.6  3.5  - 5.0 g/dL Final   AST 09/19/3233 13 (L)  15 - 41 U/L Final   ALT 03/02/2023 16  0 - 44 U/L Final   Alkaline Phosphatase 03/02/2023 65  38 - 126 U/L Final   Total Bilirubin 03/02/2023 0.5  0.3 - 1.2 mg/dL Final   GFR, Estimated 03/02/2023 >60  >60 mL/min Final   Comment: (NOTE) Calculated using the CKD-EPI Creatinine Equation (2021)    Anion gap 03/02/2023 10  5 - 15 Final   Performed at [facility], [address], [place], [place] [address]   Hgb A1c MFr Bld 03/02/2023 10.9 (H)  4.8 - 5.6 % Final   Comment: (NOTE) Pre diabetes:          5.7%-6.4%  Diabetes:              >6.4%  Glycemic control for   <7.0% adults with diabetes    Mean Plasma Glucose 03/02/2023 266.13  mg/dL Final   Performed at [facility], [address] [place], [place] [address]   Alcohol, Ethyl (B) 03/02/2023 <10  <10 mg/dL Final   Comment: (NOTE) Lowest detectable limit for serum alcohol is 10 mg/dL.  For medical purposes only. Performed at [facility], [address] [place], [place] [address]    TSH 03/02/2023 1.178  0.350 - 4.500 uIU/mL Final   Comment: Performed by a 3rd Generation assay with a functional sensitivity of <=0.01 uIU/mL. Performed at [facility], [address], [place], [place] [address]    POC Amphetamine UR 03/02/2023 None Detected  NONE DETECTED (Cut Off Level 1000 ng/mL) Final   POC Secobarbital (BAR) 03/02/2023 None Detected  NONE DETECTED (Cut Off Level 300 ng/mL) Final  POC Buprenorphine (BUP) 03/02/2023 None Detected  NONE DETECTED (Cut Off Level 10 ng/mL) Final   POC Oxazepam (BZO) 03/02/2023 None Detected  NONE DETECTED (Cut Off Level 300 ng/mL) Final   POC Cocaine UR 03/02/2023 Positive (A)  NONE DETECTED (Cut Off Level 300 ng/mL) Final   POC Methamphetamine UR 03/02/2023 None Detected  NONE DETECTED (Cut Off Level 1000 ng/mL) Final   POC Morphine 03/02/2023 None Detected  NONE DETECTED (Cut Off Level 300 ng/mL) Final   POC Methadone UR  03/02/2023 None Detected  NONE DETECTED (Cut Off Level 300 ng/mL) Final   POC Oxycodone UR 03/02/2023 None Detected  NONE DETECTED (Cut Off Level 100 ng/mL) Final   POC Marijuana UR 03/02/2023 Positive (A)  NONE DETECTED (Cut Off Level 50 ng/mL) Final   Glucose-Capillary 03/02/2023 499 (H)  70 - 99 mg/dL Final   Glucose reference range applies only to samples taken after fasting for at least 8 hours.   Glucose-Capillary 03/02/2023 404 (H)  70 - 99 mg/dL Final   Glucose reference range applies only to samples taken after fasting for at least 8 hours.   Glucose-Capillary 03/02/2023 214 (H)  70 - 99 mg/dL Final   Glucose reference range applies only to samples taken after fasting for at least 8 hours.   Glucose-Capillary 03/02/2023 265 (H)  70 - 99 mg/dL Final   Glucose reference range applies only to samples taken after fasting for at least 8 hours.   Glucose-Capillary 03/03/2023 263 (H)  70 - 99 mg/dL Final   Glucose reference range applies only to samples taken after fasting for at least 8 hours.   Glucose-Capillary 03/03/2023 258 (H)  70 - 99 mg/dL Final   Glucose reference range applies only to samples taken after fasting for at least 8 hours.  Admission on 02/05/2023, Discharged on 02/05/2023  Component Date Value Ref Range Status   Glucose-Capillary 02/05/2023 380 (H)  70 - 99 mg/dL Final   Glucose reference range applies only to samples taken after fasting for at least 8 hours.   Sodium 02/05/2023 138  135 - 145 mmol/L Final   Potassium 02/05/2023 4.0  3.5 - 5.1 mmol/L Final   Chloride 02/05/2023 103  98 - 111 mmol/L Final   CO2 02/05/2023 26  22 - 32 mmol/L Final   Glucose, Bld 02/05/2023 380 (H)  70 - 99 mg/dL Final   Glucose reference range applies only to samples taken after fasting for at least 8 hours.   BUN 02/05/2023 11  6 - 20 mg/dL Final   Creatinine, Ser 02/05/2023 0.92  0.61 - 1.24 mg/dL Final   Calcium 60/45/4098 9.1  8.9 - 10.3 mg/dL Final   GFR, Estimated 02/05/2023  >60  >60 mL/min Final   Comment: (NOTE) Calculated using the CKD-EPI Creatinine Equation (2021)    Anion gap 02/05/2023 9  5 - 15 Final   Performed at [facility], [address], [place], [place] [address]   WBC 02/05/2023 7.2  4.0 - 10.5 K/uL Final   RBC 02/05/2023 5.40  4.22 - 5.81 MIL/uL Final   Hemoglobin 02/05/2023 16.0  13.0 - 17.0 g/dL Final   HCT 78/29/5621 49.7  39.0 - 52.0 % Final   MCV 02/05/2023 92.0  80.0 - 100.0 fL Final   MCH 02/05/2023 29.6  26.0 - 34.0 pg Final   MCHC 02/05/2023 32.2  30.0 - 36.0 g/dL Final   RDW 30/86/5784 13.9  11.5 - 15.5 % Final   Platelets 02/05/2023  266  150 - 400 K/uL Final   nRBC 02/05/2023 0.0  0.0 - 0.2 % Final   Performed at [facility], [address] [place], [place] [address]   Glucose-Capillary 02/05/2023 342 (H)  70 - 99 mg/dL Final   Glucose reference range applies only to samples taken after fasting for at least 8 hours.   Total Protein 02/05/2023 7.5  6.5 - 8.1 g/dL Final   Albumin 84/69/6295 4.1  3.5 - 5.0 g/dL Final   AST 28/41/3244 16  15 - 41 U/L Final   ALT 02/05/2023 12  0 - 44 U/L Final   Alkaline Phosphatase 02/05/2023 83  38 - 126 U/L Final   Total Bilirubin 02/05/2023 1.2  0.3 - 1.2 mg/dL Final   Bilirubin, Direct 02/05/2023 0.3 (H)  0.0 - 0.2 mg/dL Final   Indirect Bilirubin 02/05/2023 0.9  0.3 - 0.9 mg/dL Final   Performed at [facility], [address] [place], [place] [address]   Lipase 02/05/2023 36  11 - 51 U/L Final   Performed at [facility], [address], [place], [place] [address]   pH, Ven 02/05/2023 7.352  7.25 - 7.43 Final   pCO2, Ven 02/05/2023 48.6  44 - 60 mmHg Final   pO2, Ven 02/05/2023 28 (LL)  32 - 45 mmHg Final   Bicarbonate 02/05/2023 26.9  20.0 - 28.0 mmol/L Final   TCO2 02/05/2023 28  22 - 32 mmol/L Final   O2 Saturation 02/05/2023 49  % Final   Acid-Base Excess 02/05/2023 0.0  0.0 - 2.0 mmol/L Final   Sodium 02/05/2023 140  135 - 145 mmol/L Final   Potassium  02/05/2023 3.9  3.5 - 5.1 mmol/L Final   Calcium, Ion 02/05/2023 1.22  1.15 - 1.40 mmol/L Final   HCT 02/05/2023 50.0  39.0 - 52.0 % Final   Hemoglobin 02/05/2023 17.0  13.0 - 17.0 g/dL Final   Sample type 44/11/4740 VENOUS   Final   Comment 02/05/2023 NOTIFIED PHYSICIAN   Final   Beta-Hydroxybutyric Acid 02/05/2023 0.21  0.05 - 0.27 mmol/L Final   Performed at [facility], [address] [place], [place] [address]   Glucose-Capillary 02/05/2023 213 (H)  70 - 99 mg/dL Final   Glucose reference range applies only to samples taken after fasting for at least 8 hours.  Admission on 01/27/2023, Discharged on 01/27/2023  Component Date Value Ref Range Status   Glucose-Capillary 01/27/2023 454 (H)  70 - 99 mg/dL Final   Glucose reference range applies only to samples taken after fasting for at least 8 hours.   WBC 01/27/2023 5.3  4.0 - 10.5 K/uL Final   RBC 01/27/2023 4.77  4.22 - 5.81 MIL/uL Final   Hemoglobin 01/27/2023 14.2  13.0 - 17.0 g/dL Final   HCT 87/56/4332 43.5  39.0 - 52.0 % Final   MCV 01/27/2023 91.2  80.0 - 100.0 fL Final   MCH 01/27/2023 29.8  26.0 - 34.0 pg Final   MCHC 01/27/2023 32.6  30.0 - 36.0 g/dL Final   RDW 95/18/8416 13.9  11.5 - 15.5 % Final   Platelets 01/27/2023 238  150 - 400 K/uL Final   nRBC 01/27/2023 0.0  0.0 - 0.2 % Final   Neutrophils Relative % 01/27/2023 68  % Final   Neutro Abs 01/27/2023 3.5  1.7 - 7.7 K/uL Final   Lymphocytes Relative 01/27/2023 23  % Final   Lymphs Abs 01/27/2023 1.2  0.7 -  4.0 K/uL Final   Monocytes Relative 01/27/2023 9  % Final   Monocytes Absolute 01/27/2023 0.5  0.1 - 1.0 K/uL Final   Eosinophils Relative 01/27/2023 0  % Final   Eosinophils Absolute 01/27/2023 0.0  0.0 - 0.5 K/uL Final   Basophils Relative 01/27/2023 0  % Final   Basophils Absolute 01/27/2023 0.0  0.0 - 0.1 K/uL Final   Immature Granulocytes 01/27/2023 0  % Final   Abs Immature Granulocytes 01/27/2023 0.02  0.00 - 0.07 K/uL Final   Performed at  [facility], [address], [place], [place] [address]   Sodium 01/27/2023 133 (L)  135 - 145 mmol/L Final   Potassium 01/27/2023 4.5  3.5 - 5.1 mmol/L Final   Chloride 01/27/2023 99  98 - 111 mmol/L Final   CO2 01/27/2023 25  22 - 32 mmol/L Final   Glucose, Bld 01/27/2023 410 (H)  70 - 99 mg/dL Final   Glucose reference range applies only to samples taken after fasting for at least 8 hours.   BUN 01/27/2023 17  6 - 20 mg/dL Final   Creatinine, Ser 01/27/2023 0.89  0.61 - 1.24 mg/dL Final   Calcium 60/45/4098 8.5 (L)  8.9 - 10.3 mg/dL Final   Total Protein 11/91/4782 6.8  6.5 - 8.1 g/dL Final   Albumin 95/62/1308 3.6  3.5 - 5.0 g/dL Final   AST 65/78/4696 19  15 - 41 U/L Final   ALT 01/27/2023 15  0 - 44 U/L Final   Alkaline Phosphatase 01/27/2023 60  38 - 126 U/L Final   Total Bilirubin 01/27/2023 0.7  0.3 - 1.2 mg/dL Final   GFR, Estimated 01/27/2023 >60  >60 mL/min Final   Comment: (NOTE) Calculated using the CKD-EPI Creatinine Equation (2021)    Anion gap 01/27/2023 9  5 - 15 Final   Performed at [facility], [address] [place], [place] [address]   Glucose-Capillary 01/27/2023 339 (H)  70 - 99 mg/dL Final   Glucose reference range applies only to samples taken after fasting for at least 8 hours.  Admission on 12/23/2022, Discharged on 12/23/2022  Component Date Value Ref Range Status   Glucose-Capillary 12/23/2022 339 (H)  70 - 99 mg/dL Final   Glucose reference range applies only to samples taken after fasting for at least 8 hours.   Comment 1 12/23/2022 Notify RN   Final   Sodium 12/23/2022 132 (L)  135 - 145 mmol/L Final   Potassium 12/23/2022 4.0  3.5 - 5.1 mmol/L Final   Chloride 12/23/2022 102  98 - 111 mmol/L Final   CO2 12/23/2022 26  22 - 32 mmol/L Final   Glucose, Bld 12/23/2022 351 (H)  70 - 99 mg/dL Final   Glucose reference range applies only to samples taken after fasting for at least 8 hours.   BUN 12/23/2022 13  6 -  20 mg/dL Final   Creatinine, Ser 12/23/2022 0.70  0.61 - 1.24 mg/dL Final   Calcium 41/32/4401 8.3 (L)  8.9 - 10.3 mg/dL Final   GFR, Estimated 12/23/2022 >60  >60 mL/min Final   Comment: (NOTE) Calculated using the CKD-EPI Creatinine Equation (2021)    Anion gap 12/23/2022 4 (L)  5 - 15 Final   Performed at [facility], [address], [place], [place] [address]   WBC 12/23/2022 6.9  4.0 - 10.5 K/uL Final   RBC 12/23/2022 4.60  4.22 - 5.81 MIL/uL Final   Hemoglobin 12/23/2022 13.6  13.0 - 17.0 g/dL Final   HCT 29/52/8413 42.7  39.0 - 52.0 % Final   MCV 12/23/2022 92.8  80.0 - 100.0 fL Final   MCH 12/23/2022 29.6  26.0 - 34.0 pg Final   MCHC 12/23/2022 31.9  30.0 - 36.0 g/dL Final   RDW 24/40/1027 13.4  11.5 - 15.5 % Final   Platelets 12/23/2022 268  150 - 400 K/uL Final   nRBC 12/23/2022 0.0  0.0 - 0.2 % Final   Performed at [facility], [address] [place], [place] [address]   Color, Urine 12/23/2022 YELLOW  YELLOW Final   APPearance 12/23/2022 CLEAR  CLEAR Final   Specific Gravity, Urine 12/23/2022 1.026  1.005 - 1.030 Final   pH 12/23/2022 7.0  5.0 - 8.0 Final   Glucose, UA 12/23/2022 >=500 (A)  NEGATIVE mg/dL Final   Hgb urine dipstick 12/23/2022 NEGATIVE  NEGATIVE Final   Bilirubin Urine 12/23/2022 NEGATIVE  NEGATIVE Final   Ketones, ur 12/23/2022 NEGATIVE  NEGATIVE mg/dL Final   Protein, ur 44/11/4740 NEGATIVE  NEGATIVE mg/dL Final   Nitrite 59/56/3875 NEGATIVE  NEGATIVE Final   Leukocytes,Ua 12/23/2022 NEGATIVE  NEGATIVE Final   RBC / HPF 12/23/2022 0-5  0 - 5 RBC/hpf Final   WBC, UA 12/23/2022 0-5  0 - 5 WBC/hpf Final   Bacteria, UA 12/23/2022 NONE SEEN  NONE SEEN Final   Squamous Epithelial / HPF 12/23/2022 0-5  0 - 5 /HPF Final   Performed at [facility], [address] [place], [place] [address]   Glucose-Capillary 12/23/2022 295 (H)  70 - 99 mg/dL Final   Glucose reference range applies only to samples  taken after fasting for at least 8 hours.   Glucose-Capillary 12/23/2022 231 (H)  70 - 99 mg/dL Final   Glucose reference range applies only to samples taken after fasting for at least 8 hours.  Office Visit on 11/30/2022  Component Date Value Ref Range Status   Creatinine, Urine 11/30/2022 108.2  Not Estab. mg/dL Final   Microalbumin, Urine 11/30/2022 12.1  Not Estab. ug/mL Final   Microalb/Creat Ratio 11/30/2022 11  0 - 29 mg/g creat Final   Comment:                        Normal:                0 -  29                        Moderately increased: 30 - 300                        Severely increased:       >300    Glucose 11/30/2022 49 (L)  70 - 99 mg/dL Final   BUN 95/18/8416 11  6 - 24 mg/dL Final   Creatinine, Ser 11/30/2022 0.77  0.76 - 1.27 mg/dL Final   eGFR 60/63/0160 108  >59 mL/min/1.73 Final   BUN/Creatinine Ratio 11/30/2022 14  9 - 20 Final   Sodium 11/30/2022 144  134 - 144 mmol/L Final   Potassium 11/30/2022 5.2  3.5 - 5.2 mmol/L Final   Chloride 11/30/2022 104  96 - 106 mmol/L Final   CO2 11/30/2022 26  20 - 29 mmol/L Final   Calcium 11/30/2022 9.3  8.7 - 10.2 mg/dL Final   Total Protein 10/93/2355 7.0  6.0 - 8.5  g/dL Final   Albumin 16/06/9603 4.2  3.8 - 4.9 g/dL Final   Globulin, Total 11/30/2022 2.8  1.5 - 4.5 g/dL Final   Albumin/Globulin Ratio 11/30/2022 1.5  1.2 - 2.2 Final   Bilirubin Total 11/30/2022 0.2  0.0 - 1.2 mg/dL Final   Alkaline Phosphatase 11/30/2022 66  44 - 121 IU/L Final   AST 11/30/2022 16  0 - 40 IU/L Final   ALT 11/30/2022 13  0 - 44 IU/L Final   Hemoglobin A1C 11/30/2022 9.0 (A)  4.0 - 5.6 % Final   POC Glucose 11/30/2022 60 (A)  70 - 99 mg/dl Final   WBC 54/05/8118 7.7  3.4 - 10.8 x10E3/uL Final   RBC 11/30/2022 4.94  4.14 - 5.80 x10E6/uL Final   Hemoglobin 11/30/2022 14.6  13.0 - 17.7 g/dL Final   Hematocrit 14/78/2956 45.2  37.5 - 51.0 % Final   MCV 11/30/2022 92  79 - 97 fL Final   MCH 11/30/2022 29.6  26.6 - 33.0 pg Final   MCHC  11/30/2022 32.3  31.5 - 35.7 g/dL Final   RDW 21/30/8657 12.7  11.6 - 15.4 % Final   Platelets 11/30/2022 269  150 - 450 x10E3/uL Final   Neutrophils 11/30/2022 61  Not Estab. % Final   Lymphs 11/30/2022 28  Not Estab. % Final   Monocytes 11/30/2022 10  Not Estab. % Final   Eos 11/30/2022 1  Not Estab. % Final   Basos 11/30/2022 0  Not Estab. % Final   Neutrophils Absolute 11/30/2022 4.6  1.4 - 7.0 x10E3/uL Final   Lymphocytes Absolute 11/30/2022 2.2  0.7 - 3.1 x10E3/uL Final   Monocytes Absolute 11/30/2022 0.8  0.1 - 0.9 x10E3/uL Final   EOS (ABSOLUTE) 11/30/2022 0.1  0.0 - 0.4 x10E3/uL Final   Basophils Absolute 11/30/2022 0.0  0.0 - 0.2 x10E3/uL Final   Immature Granulocytes 11/30/2022 0  Not Estab. % Final   Immature Grans (Abs) 11/30/2022 0.0  0.0 - 0.1 x10E3/uL Final   Cholesterol, Total 11/30/2022 112  100 - 199 mg/dL Final   Triglycerides 84/69/6295 82  0 - 149 mg/dL Final   HDL 28/41/3244 52  >39 mg/dL Final   VLDL Cholesterol Cal 11/30/2022 16  5 - 40 mg/dL Final   LDL Chol Calc (NIH) 11/30/2022 44  0 - 99 mg/dL Final   Chol/HDL Ratio 11/30/2022 2.2  0.0 - 5.0 ratio Final   Comment:                                   T. Chol/HDL Ratio                                             Men  Women                               1/2 Avg.Risk  3.4    3.3                                   Avg.Risk  5.0    4.4  2X Avg.Risk  9.6    7.1                                3X Avg.Risk 23.4   11.0   Admission on 10/28/2022, Discharged on 10/28/2022  Component Date Value Ref Range Status   WBC 10/28/2022 5.9  4.0 - 10.5 K/uL Final   RBC 10/28/2022 4.74  4.22 - 5.81 MIL/uL Final   Hemoglobin 10/28/2022 14.2  13.0 - 17.0 g/dL Final   HCT 40/98/1191 44.3  39.0 - 52.0 % Final   MCV 10/28/2022 93.5  80.0 - 100.0 fL Final   MCH 10/28/2022 30.0  26.0 - 34.0 pg Final   MCHC 10/28/2022 32.1  30.0 - 36.0 g/dL Final   RDW 47/82/9562 13.9  11.5 - 15.5 % Final   Platelets  10/28/2022 244  150 - 400 K/uL Final   nRBC 10/28/2022 0.0  0.0 - 0.2 % Final   Neutrophils Relative % 10/28/2022 72  % Final   Neutro Abs 10/28/2022 4.3  1.7 - 7.7 K/uL Final   Lymphocytes Relative 10/28/2022 19  % Final   Lymphs Abs 10/28/2022 1.1  0.7 - 4.0 K/uL Final   Monocytes Relative 10/28/2022 8  % Final   Monocytes Absolute 10/28/2022 0.5  0.1 - 1.0 K/uL Final   Eosinophils Relative 10/28/2022 1  % Final   Eosinophils Absolute 10/28/2022 0.0  0.0 - 0.5 K/uL Final   Basophils Relative 10/28/2022 0  % Final   Basophils Absolute 10/28/2022 0.0  0.0 - 0.1 K/uL Final   Immature Granulocytes 10/28/2022 0  % Final   Abs Immature Granulocytes 10/28/2022 0.01  0.00 - 0.07 K/uL Final   Performed at [facility], [address], [place], [place] [address]   Lipase 10/28/2022 137 (H)  11 - 51 U/L Final   Performed at [facility], [address], [place], [place] [address]   Opiates 10/28/2022 NONE DETECTED  NONE DETECTED Final   Cocaine 10/28/2022 POSITIVE (A)  NONE DETECTED Final   Benzodiazepines 10/28/2022 NONE DETECTED  NONE DETECTED Final   Amphetamines 10/28/2022 NONE DETECTED  NONE DETECTED Final   Tetrahydrocannabinol 10/28/2022 NONE DETECTED  NONE DETECTED Final   Barbiturates 10/28/2022 NONE DETECTED  NONE DETECTED Final   Comment: (NOTE) DRUG SCREEN FOR MEDICAL PURPOSES ONLY.  IF CONFIRMATION IS NEEDED FOR ANY PURPOSE, NOTIFY LAB WITHIN 5 DAYS.  LOWEST DETECTABLE LIMITS FOR URINE DRUG SCREEN Drug Class                     Cutoff (ng/mL) Amphetamine and metabolites    1000 Barbiturate and metabolites    200 Benzodiazepine                 200 Opiates and metabolites        300 Cocaine and metabolites        300 THC                            50 Performed at [facility], [address], [place], [place] [address]    Alcohol, Ethyl (B) 10/28/2022 <10  <10 mg/dL Final   Comment: (NOTE) Lowest detectable limit for serum alcohol is 10 mg/dL.  For  medical purposes only. Performed at [facility], [address], [place], [place] [address]    Sodium  10/28/2022 135  135 - 145 mmol/L Final   Potassium 10/28/2022 4.0  3.5 - 5.1 mmol/L Final   Chloride 10/28/2022 99  98 - 111 mmol/L Final   CO2 10/28/2022 23  22 - 32 mmol/L Final   Glucose, Bld 10/28/2022 273 (H)  70 - 99 mg/dL Final   Glucose reference range applies only to samples taken after fasting for at least 8 hours.   BUN 10/28/2022 7  6 - 20 mg/dL Final   Creatinine, Ser 10/28/2022 0.69  0.61 - 1.24 mg/dL Final   Calcium 53/66/4403 9.0  8.9 - 10.3 mg/dL Final   Total Protein 47/42/5956 7.2  6.5 - 8.1 g/dL Final   Albumin 38/75/6433 3.8  3.5 - 5.0 g/dL Final   AST 29/51/8841 22  15 - 41 U/L Final   ALT 10/28/2022 17  0 - 44 U/L Final   Alkaline Phosphatase 10/28/2022 65  38 - 126 U/L Final   Total Bilirubin 10/28/2022 0.3  0.3 - 1.2 mg/dL Final   GFR, Estimated 10/28/2022 >60  >60 mL/min Final   Comment: (NOTE) Calculated using the CKD-EPI Creatinine Equation (2021)    Anion gap 10/28/2022 13  5 - 15 Final   Performed at [facility], [address], [place], [place] [address]   Salicylate Lvl 10/28/2022 <7.0 (L)  7.0 - 30.0 mg/dL Final   Performed at [facility], [address], [place], [place] [address]   Acetaminophen (Tylenol), Serum 10/28/2022 <10 (L)  10 - 30 ug/mL Final   Comment: (NOTE) Therapeutic concentrations vary significantly. A range of 10-30 ug/mL  may be an effective concentration for many patients. However, some  are best treated at concentrations outside of this range. Acetaminophen concentrations >150 ug/mL at 4 hours after ingestion  and >50 ug/mL at 12 hours after ingestion are often associated with  toxic reactions.  Performed at [facility], [address], [place], [place] [address]   Admission on 10/10/2022, Discharged on 10/10/2022  Component Date Value Ref Range Status   Glucose-Capillary 10/10/2022 >600 (HH)   70 - 99 mg/dL Final   Glucose reference range applies only to samples taken after fasting for at least 8 hours.   Sodium 10/10/2022 129 (L)  135 - 145 mmol/L Final   Potassium 10/10/2022 4.4  3.5 - 5.1 mmol/L Final   Chloride 10/10/2022 95 (L)  98 - 111 mmol/L Final   CO2 10/10/2022 24  22 - 32 mmol/L Final   Glucose, Bld 10/10/2022 683 (HH)  70 - 99 mg/dL Final   Comment: CRITICAL RESULT CALLED TO, READ BACK BY AND VERIFIED WITH LAMB,S. RN AT 1219 10/10/22 MULLINS,T Glucose reference range applies only to samples taken after fasting for at least 8 hours.    BUN 10/10/2022 16  6 - 20 mg/dL Final   Creatinine, Ser 10/10/2022 0.93  0.61 - 1.24 mg/dL Final   Calcium 55/73/2202 8.4 (L)  8.9 - 10.3 mg/dL Final   Total Protein 54/27/0623 6.9  6.5 - 8.1 g/dL Final   Albumin 76/28/3151 3.6  3.5 - 5.0 g/dL Final   AST 76/16/0737 16  15 - 41 U/L Final   ALT 10/10/2022 11  0 - 44 U/L Final   Alkaline Phosphatase 10/10/2022 61  38 - 126 U/L Final   Total Bilirubin 10/10/2022 0.7  0.3 - 1.2 mg/dL Final   GFR, Estimated 10/10/2022 >60  >60 mL/min Final   Comment: (NOTE) Calculated using the CKD-EPI Creatinine Equation (  2021)    Anion gap 10/10/2022 10  5 - 15 Final   Performed at [facility], [address] [place], [place] [address]   Color, Urine 10/10/2022 STRAW (A)  YELLOW Final   APPearance 10/10/2022 CLEAR  CLEAR Final   Specific Gravity, Urine 10/10/2022 1.031 (H)  1.005 - 1.030 Final   pH 10/10/2022 5.0  5.0 - 8.0 Final   Glucose, UA 10/10/2022 >=500 (A)  NEGATIVE mg/dL Final   Hgb urine dipstick 10/10/2022 NEGATIVE  NEGATIVE Final   Bilirubin Urine 10/10/2022 NEGATIVE  NEGATIVE Final   Ketones, ur 10/10/2022 NEGATIVE  NEGATIVE mg/dL Final   Protein, ur 69/62/9528 NEGATIVE  NEGATIVE mg/dL Final   Nitrite 41/32/4401 NEGATIVE  NEGATIVE Final   Leukocytes,Ua 10/10/2022 NEGATIVE  NEGATIVE Final   RBC / HPF 10/10/2022 0-5  0 - 5 RBC/hpf Final   WBC, UA 10/10/2022 0-5   0 - 5 WBC/hpf Final   Bacteria, UA 10/10/2022 NONE SEEN  NONE SEEN Final   Squamous Epithelial / HPF 10/10/2022 0-5  0 - 5 /HPF Final   Performed at [facility], [address], [place], [place] [address]   Beta-Hydroxybutyric Acid 10/10/2022 0.41 (H)  0.05 - 0.27 mmol/L Final   Performed at [facility], [address] [place], [place] [address]   pH, Ven 10/10/2022 7.39  7.25 - 7.43 Final   pCO2, Ven 10/10/2022 42 (L)  44 - 60 mmHg Final   pO2, Ven 10/10/2022 59 (H)  32 - 45 mmHg Final   Bicarbonate 10/10/2022 25.4  20.0 - 28.0 mmol/L Final   Acid-Base Excess 10/10/2022 0.3  0.0 - 2.0 mmol/L Final   O2 Saturation 10/10/2022 92.1  % Final   Patient temperature 10/10/2022 37.0   Final   Performed at [facility], [address] [place], [place] [address]   Osmolality 10/10/2022 315 (H)  275 - 295 mOsm/kg Final   Comment: REPEATED TO VERIFY Performed at [facility], [address] [place], [place] [address]    WBC 10/10/2022 5.3  4.0 - 10.5 K/uL Final   RBC 10/10/2022 4.89  4.22 - 5.81 MIL/uL Final   Hemoglobin 10/10/2022 14.7  13.0 - 17.0 g/dL Final   HCT 38/75/6433 44.6  39.0 - 52.0 % Final   MCV 10/10/2022 91.2  80.0 - 100.0 fL Final   MCH 10/10/2022 30.1  26.0 - 34.0 pg Final   MCHC 10/10/2022 33.0  30.0 - 36.0 g/dL Final   RDW 29/51/8841 13.1  11.5 - 15.5 % Final   Platelets 10/10/2022 235  150 - 400 K/uL Final   nRBC 10/10/2022 0.0  0.0 - 0.2 % Final   Performed at [facility], [address] [place], [place] [address]   Glucose-Capillary 10/10/2022 480 (H)  70 - 99 mg/dL Final   Glucose reference range applies only to samples taken after fasting for at least 8 hours.    Blood Alcohol level:  Lab Results  Component Value Date   ETH <10 03/02/2023   ETH <10 10/28/2022    Metabolic Disorder Labs: Lab Results  Component Value Date   HGBA1C 10.9 (H) 03/02/2023   MPG 266.13  03/02/2023   MPG 231.69 03/22/2021   No results found for: "PROLACTIN" Lab Results  Component Value Date   CHOL 112 11/30/2022   TRIG 82 11/30/2022   HDL 52 11/30/2022   CHOLHDL 2.2 11/30/2022   VLDL 21 04/10/2017   LDLCALC  44 11/30/2022   LDLCALC 99 03/24/2021    Therapeutic Lab Levels: No results found for: "LITHIUM" No results found for: "VALPROATE" No results found for: "CBMZ"  Physical Findings   GAD-7    Flowsheet Row Office Visit from 11/30/2022 in [facility]  Total GAD-7 Score 12      PHQ2-9    Flowsheet Row ED from 03/03/2023 in [facility] Office Visit from 11/30/2022 in [facility] Office Visit from 03/24/2021 in [facility] Office Visit from 08/23/2018 in [facility] Office Visit from 05/08/2017 in [facility]  PHQ-2 Total Score 3 0 0 0 0  PHQ-9 Total Score 7 3 -- -- --      Flowsheet Row ED from 03/03/2023 in [facility] ED from 03/01/2023 in [facility] from 02/05/2023 in [facility] Department at [facility]  C-SSRS RISK CATEGORY No Risk High Risk No Risk        Musculoskeletal  Strength & Muscle Tone: within normal limits Gait & Station: normal Patient leans: N/A  Psychiatric Specialty Exam  Presentation  General Appearance:  Appropriate for Environment; Casual  Eye Contact: Good  Speech: Clear and Coherent; Normal Rate  Speech Volume: Normal  Handedness: Right   Mood and Affect  Mood: Depressed  Affect: Full Range   Thought Process  Thought Processes: Coherent; Linear  Descriptions of Associations:Intact  Orientation:Full (Time, Place and Person)  Thought Content:Logical; WDL  Diagnosis of Schizophrenia or Schizoaffective disorder in past: No    Hallucinations:Hallucinations: None  Ideas  of Reference:None  Suicidal Thoughts:Suicidal Thoughts: No SI Passive Intent and/or Plan: Without Intent; Without Plan  Homicidal Thoughts:Homicidal Thoughts: No   Sensorium  Memory: Immediate Good; Recent Good  Judgment: Good  Insight: Good   Executive Functions  Concentration: Good  Attention Span: Good  Recall: Good  Fund of Knowledge: Good  Language: Good   Psychomotor Activity  Psychomotor Activity: Psychomotor Activity: Normal   Assets  Assets: Communication Skills; Desire for Improvement; Social Support   Sleep  Sleep: Sleep: Fair   Nutritional Assessment (For OBS and FBC admissions only) Has the patient had a weight loss or gain of 10 pounds or more in the last 3 months?: No Has the patient had a decrease in food intake/or appetite?: No Does the patient have dental problems?: No Does the patient have eating habits or behaviors that may be indicators of an eating disorder including binging or inducing vomiting?: No Has the patient recently lost weight without trying?: 0 Has the patient been eating poorly because of a decreased appetite?: 0 Malnutrition Screening Tool Score: 0    Physical Exam  Physical Exam Vitals and nursing note reviewed.  Constitutional:      Appearance: Normal appearance.  Neurological:     Mental Status: He is alert and oriented to person, place, and time.  Psychiatric:        Mood and Affect: Mood normal.        Behavior: Behavior normal.        Thought Content: Thought content normal.    Review of Systems  Respiratory: Negative.    Cardiovascular: Negative.   Psychiatric/Behavioral:  Positive for substance abuse. Negative for depression.   All other systems reviewed and are negative.  Blood pressure (!) 139/95, Kim 77, temperature 98.7 F (37.1 C), temperature source Tympanic,  resp. rate 18, SpO2 100 %. There is no height or weight on file to calculate BMI.  Treatment Plan Summary: Daily contact with  patient to assess and evaluate symptoms and progress in treatment and Medication management  Dakota Kim is a 52 year old male with no formal psychiatric diagnoses prior to admission but who meets criteria for cocaine use disorder, nicotine use disorder, substance-induced mood disorder, and prolonged grief who presented voluntarily to [facility] for detox and substance use treatment, for which he was admitted to [facility]. Patient remains motivated for residential substance use treatment and has good insight into his triggers as well as what worked for him in the past in maintaining sobriety. LCSW assisting with dispo planning.   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 3 times daily as needed Continue trazodone 50 mg p.o. nightly as needed Continue gabapentin 300 mg p.o. 3 times daily Nicotine patch 14 mg daily PRN  #T2DM Continue glipizide 5 mg p.o. daily Continue insulin coverage Semglee 20 units daily Continue NovoLog 10 units meal coverage See chart for sliding scale  #Hyperlipidemia Continue Lipitor 10 mg daily  Patient encouraged to participate within the therapeutic milieu   Dispo: Pending  Dakota Sprinkles, MD 03/05/2023 2:17 PM

## 2023-03-05 NOTE — ED Notes (Signed)
Patients CBG was 254

## 2023-03-05 NOTE — Tx Team (Signed)
LCSW, MD, and Resident met with patient to assess current mood, affect, physical state, and inquire about needs/goals while here in [facility] and after discharge. Patient reports he presented due to detox and needing further residential treatment. Patient reports he has been living on the streets and in and out of his car when it is not too hot since March of this year. Patient reports prior to that he was at [facility], however reports he left after an outbreak of bed bugs on the unit. Patient reports struggling with crack cocaine use since being on the streets and reports he knew things were headed in the wrong direction as he continue to hang around familiar environments and people. Patient reports he wanted to help himself before things got worse so he presented to the [facility] for help. Patient was provided brief supportive counseling for presenting to get further treatment. Patient reports having a good family support, however acknowledges that he has burned bridges. Patient reports his family has offered to help him, however he has decided to help himself out of his situation on his own. Patient reports a prior use of crack cocaine before 2000, however reports after the year 2000 he was sober until 2021 when his father passed. Patient reports his father was his best friend and reports he had no further drive to live after this. Patient reports making a vow to his mother that he would not do anything to harm himself, and reports that is one of the reasons he is alive today. Patient denies any current SI/HI/AVH. Patient reports during period of sobriety, he was working, participating in hobbies that he enjoys, and had better relationships with his loved ones. Patient reports he would like to get back to a place of independence and would also like to work towards recovery and keeping himself clean. Patient reports when he was under the influence he did not take care of himself. Patient reports being on a  high for days to the point he eventually started missing work. Patient reports substances has been the thing that he ended every job he has had. Patient reports an interest in seeking residential placement at this time for his substance use. Patient denies any prior history of outpatient or inpatient substance abuse treatment. Patient aware that LCSW will send referrals out for review and will follow up to provide updates as received. Patient expressed understanding and appreciation of LCSW assistance. No other needs were reported at this time by patient.   Referrals has been sent to [facility], [facility], [facility] for review. LCSW will continue to follow and provide support to patient while on [facility] unit.   [name], LCSW Clinical Social Worker [facility] Ph: [phone]

## 2023-03-05 NOTE — ED Notes (Signed)
Pt is in the Dayroom watching TV. Respirations are even and unlabored. No acute distress noted. Will continue to monitor for safety.  

## 2023-03-05 NOTE — Group Note (Signed)
Group Topic: Relapse and Recovery  Group Date: 03/05/2023 Start Time: 0745 End Time: 0830 Facilitators: Emmit Pomfret D, NT  Department: [facility]  Number of Participants: 4  Group Focus: self-awareness Treatment Modality:  Psychoeducation Interventions utilized were support Purpose: regain self-worth  Name: Dakota Kim Date of Birth: 1970-09-23  MR: [id number]    Level of Participation: moderate Quality of Participation: attentive Interactions with others: gave feedback Mood/Affect: appropriate Triggers (if applicable): n/a Cognition: concrete Progress: Significant Response: n/a Plan: follow-up needed  Patients Problems:  Patient Active Problem List   Diagnosis Date Noted   Prolonged grief disorder 03/05/2023   Polysubstance abuse (HCC) 03/03/2023   Substance induced mood disorder (HCC) 03/03/2023   Foot infection 11/30/2022   Hyperlipidemia 02/26/2019   Class 2 severe obesity due to excess calories with serious comorbidity and body mass index (BMI) of 35.0 to 35.9 in adult [facility]) 02/26/2019   Type 2 diabetes mellitus without complication, with long-term current use of insulin (HCC) 05/13/2017   Hypokalemia 11/06/2012

## 2023-03-05 NOTE — BH IP Treatment Plan (Signed)
Interdisciplinary Treatment and Diagnostic Plan Update  03/05/2023 Time of Session: 10:30AM Dakota Kim MRN: [id number]  Diagnosis:  Final diagnoses:  Substance induced mood disorder (HCC)  Polysubstance abuse (HCC)     Current Medications:  Current Facility-Administered Medications  Medication Dose Route Frequency Provider Last Rate Last Admin   acetaminophen (TYLENOL) tablet 650 mg  650 mg Oral Q6H PRN Rankin, Shuvon B, NP       alum & mag hydroxide-simeth (MAALOX/MYLANTA) 200-200-20 MG/5ML suspension 30 mL  30 mL Oral Q4H PRN Rankin, Shuvon B, NP       atorvastatin (LIPITOR) tablet 10 mg  10 mg Oral Daily Rankin, Shuvon B, NP   10 mg at 03/05/23 0931   gabapentin (NEURONTIN) capsule 300 mg  300 mg Oral BID Rankin, Shuvon B, NP   300 mg at 03/05/23 0931   glipiZIDE (GLUCOTROL) tablet 5 mg  5 mg Oral QAC breakfast Rankin, Shuvon B, NP   5 mg at 03/05/23 0813   insulin aspart (novoLOG) injection 0-15 Units  0-15 Units Subcutaneous TID WC Rankin, Shuvon B, NP   3 Units at 03/05/23 1201   insulin aspart (novoLOG) injection 0-5 Units  0-5 Units Subcutaneous QHS Rankin, Shuvon B, NP   3 Units at 03/04/23 2110   insulin glargine-yfgn (SEMGLEE) injection 20 Units  20 Units Subcutaneous Daily Rankin, Shuvon B, NP   20 Units at 03/05/23 0928   polyethylene glycol (MIRALAX / GLYCOLAX) packet 17 g  17 g Oral Daily PRN Ajibola, Ene A, NP   17 g at 03/03/23 2138   traZODone (DESYREL) tablet 50 mg  50 mg Oral QHS PRN Rankin, Shuvon B, NP   50 mg at 03/04/23 2110   Current Outpatient Medications  Medication Sig Dispense Refill   atorvastatin (LIPITOR) 10 MG tablet TAKE 1 TABLET (10 MG TOTAL) BY MOUTH DAILY. (Patient not taking: Reported on 03/02/2023) 30 tablet 11   gabapentin (NEURONTIN) 300 MG capsule Take 1 capsule (300 mg total) by mouth 2 (two) times daily. (Patient not taking: Reported on 03/02/2023) 60 capsule 2   glipiZIDE (GLUCOTROL) 5 MG tablet Take 1 tablet (5 mg total) by mouth daily  before breakfast. (Patient not taking: Reported on 03/02/2023) 60 tablet 0   hydrOXYzine (ATARAX) 25 MG tablet Take 1 tablet (25 mg total) by mouth 3 (three) times daily as needed for anxiety. 30 tablet 0   insulin aspart (NOVOLOG) 100 UNIT/ML injection Inject 10 Units into the skin 3 (three) times daily with meals. 10 mL 11   insulin aspart (NOVOLOG) 100 UNIT/ML injection Inject 0-15 Units into the skin 3 (three) times daily with meals. 10 mL 11   insulin aspart (NOVOLOG) 100 UNIT/ML injection Inject 0-5 Units into the skin at bedtime. 10 mL 11   Insulin Glargine (BASAGLAR KWIKPEN) 100 UNIT/ML Inject 15 Units into the skin at bedtime. (Patient taking differently: Inject 20 Units into the skin at bedtime.) 15 mL 1   insulin glargine-yfgn (SEMGLEE) 100 UNIT/ML injection Inject 0.2 mLs (20 Units total) into the skin daily. 10 mL 11   traZODone (DESYREL) 50 MG tablet Take 1 tablet (50 mg total) by mouth at bedtime as needed for sleep.     PTA Medications: Prior to Admission medications   Medication Sig Start Date End Date Taking? Authorizing Provider  atorvastatin (LIPITOR) 10 MG tablet TAKE 1 TABLET (10 MG TOTAL) BY MOUTH DAILY. Patient not taking: Reported on 03/02/2023 10/24/22   Storm Frisk, MD  gabapentin (NEURONTIN) 300 MG  capsule Take 1 capsule (300 mg total) by mouth 2 (two) times daily. Patient not taking: Reported on 03/02/2023 01/04/23 04/04/23  Standiford, Jenelle Mages, DPM  glipiZIDE (GLUCOTROL) 5 MG tablet Take 1 tablet (5 mg total) by mouth daily before breakfast. Patient not taking: Reported on 03/02/2023 11/15/22   Storm Frisk, MD  hydrOXYzine (ATARAX) 25 MG tablet Take 1 tablet (25 mg total) by mouth 3 (three) times daily as needed for anxiety. 03/03/23   Rankin, Shuvon B, NP  insulin aspart (NOVOLOG) 100 UNIT/ML injection Inject 10 Units into the skin 3 (three) times daily with meals. 03/03/23   Rankin, Shuvon B, NP  insulin aspart (NOVOLOG) 100 UNIT/ML injection Inject 0-15 Units  into the skin 3 (three) times daily with meals. 03/03/23   Rankin, Shuvon B, NP  insulin aspart (NOVOLOG) 100 UNIT/ML injection Inject 0-5 Units into the skin at bedtime. 03/03/23   Rankin, Shuvon B, NP  Insulin Glargine (BASAGLAR KWIKPEN) 100 UNIT/ML Inject 15 Units into the skin at bedtime. Patient taking differently: Inject 20 Units into the skin at bedtime. 12/23/22   Gareth Eagle, PA-C  insulin glargine-yfgn (SEMGLEE) 100 UNIT/ML injection Inject 0.2 mLs (20 Units total) into the skin daily. 03/04/23   Rankin, Shuvon B, NP  traZODone (DESYREL) 50 MG tablet Take 1 tablet (50 mg total) by mouth at bedtime as needed for sleep. 03/03/23   Rankin, Shuvon B, NP  ferrous sulfate 325 (65 FE) MG tablet Take 1 tablet (325 mg total) by mouth daily. Patient not taking: Reported on 09/23/2019 08/28/18 07/28/20  Kallie Locks, FNP  Insulin Lispro Prot & Lispro (HUMALOG MIX 75/25 KWIKPEN) (75-25) 100 UNIT/ML Kwikpen Inject 45 Units into the skin 2 (two) times daily with breakfast and lunch. 10/13/20 10/13/20  Gailen Shelter, PA    Patient Stressors: Substance abuse   Other: homelessness    Patient Strengths: Ability for insight  Average or above average intelligence  Capable of independent living  Communication skills  General fund of knowledge  Motivation for treatment/growth  Supportive family/friends  Work skills   Treatment Modalities: Medication Management, Group therapy, Case management,  1 to 1 session with clinician, Psychoeducation, Recreational therapy.   Physician Treatment Plan for Primary and Secondary Diagnosis:  Final diagnoses:  Substance induced mood disorder (HCC)  Polysubstance abuse (HCC)   Long Term Goal(s): Improvement in symptoms so as ready for discharge  Short Term Goals: Patient will verbalize feelings in meetings with treatment team members. Patient will attend at least of 50% of the groups daily. Pt will complete the PHQ9 on admission, day 3 and  discharge. Patient will participate in completing the [place] Suicide Severity Rating Scale Patient will score a low risk of violence for 24 hours prior to discharge Patient will take medications as prescribed daily.  Medication Management: Evaluate patient's response, side effects, and tolerance of medication regimen.  Therapeutic Interventions: 1 to 1 sessions, Unit Group sessions and Medication administration.  Evaluation of Outcomes: Progressing  LCSW Treatment Plan for Primary Diagnosis:  Final diagnoses:  Substance induced mood disorder (HCC)  Polysubstance abuse (HCC)    Long Term Goal(s): Safe transition to appropriate next level of care at discharge.  Short Term Goals: Facilitate acceptance of mental health diagnosis and concerns through verbal commitment to aftercare plan and appointments at discharge., Patient will identify one social support prior to discharge to aid in patient's recovery., Patient will attend AA/NA groups as scheduled., Identify minimum of 2 triggers associated with mental  health/substance abuse issues with treatment team members., and Increase skills for wellness and recovery by attending 50% of scheduled groups.  Therapeutic Interventions: Assess for all discharge needs, 1 to 1 time with Child psychotherapist, Explore available resources and support systems, Assess for adequacy in community support network, Educate family and significant other(s) on suicide prevention, Complete Psychosocial Assessment, Interpersonal group therapy.  Evaluation of Outcomes: Progressing   Progress in Treatment: Attending groups: Yes. Participating in groups: Yes. Taking medication as prescribed: Yes. Toleration medication: Yes. Family/Significant other contact made: No, will contact:  LCSW will follow up regarding collateral as appropriate.  Patient understands diagnosis: Yes. Discussing patient identified problems/goals with staff: Yes. Medical problems stabilized or resolved:  Yes. Denies suicidal/homicidal ideation: Yes. Issues/concerns per patient self-inventory: Yes. Other: homelessness and need for further treatment  New problem(s) identified: No, Describe:  other than reported on admission.   New Short Term/Long Term Goal(s): Safe transition to appropriate next level of care at discharge, Engage patient in therapeutic group addressing interpersonal concerns. Engage patient in aftercare planning with referrals and resources, Increase ability to appropriately verbalize feelings, Facilitate acceptance of mental health diagnosis and concerns and Identify triggers associated with mental health/substance abuse issues.   Patient Goals:  Patient is seeking residential placement at this time for substance use with hopes to secure placement within 3-5 days.   Discharge Plan or Barriers: LCSW will send referrals out for review for residential placement. Updates will be provided as received.   Reason for Continuation of Hospitalization: Withdrawal symptoms  Estimated Length of Stay: 3-5 days  Last 3 [place] Suicide Severity Risk Score: Flowsheet Row ED from 03/03/2023 in [facility] ED from 03/01/2023 in [facility] ED from 02/05/2023 in [facility] at [facility]  C-SSRS RISK CATEGORY No Risk High Risk No Risk       Last [facility] 2/9 Scores:    03/03/2023    2:34 PM 11/30/2022   11:17 AM 03/24/2021    2:09 PM  Depression screen PHQ 2/9  Decreased Interest 1 0 0  Down, Depressed, Hopeless 2 0 0  PHQ - 2 Score 3 0 0  Altered sleeping 1 3   Tired, decreased energy 1 0   Change in appetite 0 0   Feeling bad or failure about yourself  1 0   Trouble concentrating 0 0   Moving slowly or fidgety/restless 1 0   Suicidal thoughts 0 0   PHQ-9 Score 7 3   Difficult doing work/chores Somewhat difficult      Scribe for Treatment Team: Loleta Dicker, LCSWA 03/05/2023 1:16 PM

## 2023-03-06 DIAGNOSIS — E119 Type 2 diabetes mellitus without complications: Secondary | ICD-10-CM | POA: Diagnosis not present

## 2023-03-06 DIAGNOSIS — Z794 Long term (current) use of insulin: Secondary | ICD-10-CM

## 2023-03-06 DIAGNOSIS — Z560 Unemployment, unspecified: Secondary | ICD-10-CM | POA: Diagnosis not present

## 2023-03-06 DIAGNOSIS — R45851 Suicidal ideations: Secondary | ICD-10-CM | POA: Diagnosis not present

## 2023-03-06 DIAGNOSIS — F1410 Cocaine abuse, uncomplicated: Secondary | ICD-10-CM | POA: Diagnosis not present

## 2023-03-06 DIAGNOSIS — Z5900 Homelessness unspecified: Secondary | ICD-10-CM | POA: Diagnosis not present

## 2023-03-06 LAB — GLUCOSE, CAPILLARY
Glucose-Capillary: 232 mg/dL — ABNORMAL HIGH (ref 70–99)
Glucose-Capillary: 231 mg/dL — ABNORMAL HIGH (ref 70–99)
Glucose-Capillary: 296 mg/dL — ABNORMAL HIGH (ref 70–99)
Glucose-Capillary: 257 mg/dL — ABNORMAL HIGH (ref 70–99)

## 2023-03-06 NOTE — Group Note (Signed)
Group Topic: Recovery Basics  Group Date: 03/06/2023 Start Time: 1300 End Time: 1320 Facilitators: Jenean Lindau, RN  Department: [facility]  Number of Participants: 4  Group Focus: chemical dependency education and chemical dependency issues Treatment Modality:  Patient-Centered Therapy Interventions utilized were patient education Purpose: increase insight  Name: Dakota Kim Date of Birth: 1970/12/06  MR: [id number]    Level of Participation: active Quality of Participation: attentive Interactions with others: gave feedback Mood/Affect: appropriate Triggers (if applicable):   Cognition: goal directed Progress: Moderate Response:   Plan: follow-up needed  Patients Problems:  Patient Active Problem List   Diagnosis Date Noted   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

## 2023-03-06 NOTE — ED Notes (Signed)
Progress note   D: Pt seen at med window. Pt denies SI, HI, AVH. Pt rates pain  7/10 as a headache in the temple area bilaterally. Pt rates anxiety  0/10 and depression  0/10. Pt states best thing about his day is that he is here. "I wanted to leave today because someone who was here was discharging but I stayed." Pt asked about managing his blood sugar. Pt states that he takes Lantus 20 units at bedtime. Here, he is getting that dose during the day. He also says that he takes meal coverage insulin. On his PTA med list he was taking 10 units for meal coverage three times a day as well as sliding scale insulin. He says this kept his blood sugar numbers manageable and not as high as they are on FBC. No other concerns noted at this time.  A: Pt provided support and encouragement. Pt given scheduled medication as prescribed. Pt refused scheduled gabapentin because it is a capsule. Pt states he has chewed pills all his life. He was chewing the capsule every time it was given to him. He didn't feel like chewing it up tonight. Offered applesauce and pudding, but said it wouldn't make a diffeence. PRNs as appropriate. Q15 min checks for safety.   R: Pt safe on the unit. Will continue to monitor.

## 2023-03-06 NOTE — ED Notes (Signed)
Patients f.s at 12noon was 298 and he received 8 units novolog for sliding scale.  Patient has been calm and cooperative with care.  Will monitor and provide safe environment.

## 2023-03-06 NOTE — ED Notes (Signed)
Patient is sleeping. Respirations equal and unlabored, skin warm and dry. No change in assessment or acuity. Routine safety checks conducted according to facility protocol. Will continue to monitor for safety.   

## 2023-03-06 NOTE — ED Notes (Signed)
Pt at med window at this hour. No apparent distress. RR even and unlabored. Monitored for safety.  

## 2023-03-06 NOTE — ED Provider Notes (Signed)
Behavioral Health Progress Note  Date and Time: 03/06/2023 8:55 AM Name: Dakota Kim MRN:  [id number]  Subjective:  Dakota Kim is a 52 year old male with no formal psychiatric diagnoses prior to admission but who meets criteria for cocaine use disorder, nicotine use disorder, and prolonged grief who presented voluntarily to [facility] for detox and substance use treatment, for which he was admitted to [facility].  On assessment today, Dakota Kim reports doing well. He had a conversation with his mother last night that was encouraging. He was honest regarding his ambivalence to stay after another Dakota Kim discharged, with whom he related. However, motivational interviewing was provided and Dakota Kim remained hopeful about staying and continuing with treatment. He reports sleeping well and improved appetite. He has not yet had a BM, but denies all other somatic concerns and complaints. Dakota Kim denies SI, HI, and AVH.   Diagnosis:  Final diagnoses:  Substance induced mood disorder (HCC)  Polysubstance abuse (HCC)  Prolonged grief disorder    Total Time spent with Dakota Kim: 15 minutes  Past Psychiatric History: See HPI Past Medical History: See HPI  family History: See HPI Family Psychiatric  History: See HPI Social History: See HPI  Additional Social History:                         Sleep: Fair, improving  Appetite:  Fair, improving  Current Medications:  Current Facility-Administered Medications  Medication Dose Route Frequency Provider Last Rate Last Admin   acetaminophen (TYLENOL) tablet 650 mg  650 mg Oral Q6H PRN Rankin, Shuvon B, NP       alum & mag hydroxide-simeth (MAALOX/MYLANTA) 200-200-20 MG/5ML suspension 30 mL  30 mL Oral Q4H PRN Rankin, Shuvon B, NP       atorvastatin (LIPITOR) tablet 10 mg  10 mg Oral Daily Rankin, Shuvon B, NP   10 mg at 03/05/23 0931   gabapentin (NEURONTIN) capsule 300 mg  300 mg Oral BID Rankin, Shuvon B, NP   300 mg at 03/05/23 2132   glipiZIDE  (GLUCOTROL) tablet 5 mg  5 mg Oral QAC breakfast Rankin, Shuvon B, NP   5 mg at 03/06/23 0828   insulin aspart (novoLOG) injection 0-15 Units  0-15 Units Subcutaneous TID WC Rankin, Shuvon B, NP   5 Units at 03/06/23 0827   insulin aspart (novoLOG) injection 0-5 Units  0-5 Units Subcutaneous QHS Rankin, Shuvon B, NP   5 Units at 03/05/23 2140   insulin glargine-yfgn (SEMGLEE) injection 20 Units  20 Units Subcutaneous Daily Rankin, Shuvon B, NP   20 Units at 03/05/23 5638   nicotine (NICODERM CQ - dosed in mg/24 hours) patch 14 mg  14 mg Transdermal Daily PRN Lamar Sprinkles, MD       polyethylene glycol (MIRALAX / GLYCOLAX) packet 17 g  17 g Oral Daily PRN Ajibola, Ene A, NP   17 g at 03/03/23 2138   traZODone (DESYREL) tablet 50 mg  50 mg Oral QHS PRN Rankin, Shuvon B, NP   50 mg at 03/05/23 2132   Current Outpatient Medications  Medication Sig Dispense Refill   atorvastatin (LIPITOR) 10 MG tablet TAKE 1 TABLET (10 MG TOTAL) BY MOUTH DAILY. (Dakota Kim not taking: Reported on 03/02/2023) 30 tablet 11   gabapentin (NEURONTIN) 300 MG capsule Take 1 capsule (300 mg total) by mouth 2 (two) times daily. (Dakota Kim not taking: Reported on 03/02/2023) 60 capsule 2   glipiZIDE (GLUCOTROL) 5 MG tablet Take 1 tablet (5 mg total) by mouth  daily before breakfast. (Dakota Kim not taking: Reported on 03/02/2023) 60 tablet 0   hydrOXYzine (ATARAX) 25 MG tablet Take 1 tablet (25 mg total) by mouth 3 (three) times daily as needed for anxiety. 30 tablet 0   insulin aspart (NOVOLOG) 100 UNIT/ML injection Inject 10 Units into the skin 3 (three) times daily with meals. 10 mL 11   insulin aspart (NOVOLOG) 100 UNIT/ML injection Inject 0-15 Units into the skin 3 (three) times daily with meals. 10 mL 11   insulin aspart (NOVOLOG) 100 UNIT/ML injection Inject 0-5 Units into the skin at bedtime. 10 mL 11   Insulin Glargine (BASAGLAR KWIKPEN) 100 UNIT/ML Inject 15 Units into the skin at bedtime. (Dakota Kim taking differently: Inject 20  Units into the skin at bedtime.) 15 mL 1   insulin glargine-yfgn (SEMGLEE) 100 UNIT/ML injection Inject 0.2 mLs (20 Units total) into the skin daily. 10 mL 11   traZODone (DESYREL) 50 MG tablet Take 1 tablet (50 mg total) by mouth at bedtime as needed for sleep.      Labs  Lab Results:  Admission on 03/03/2023  Component Date Value Ref Range Status   Glucose-Capillary 03/03/2023 287 (H)  70 - 99 mg/dL Final   Glucose reference range applies only to samples taken after fasting for at least 8 hours.   Glucose-Capillary 03/04/2023 252 (H)  70 - 99 mg/dL Final   Glucose reference range applies only to samples taken after fasting for at least 8 hours.   Glucose-Capillary 03/04/2023 341 (H)  70 - 99 mg/dL Final   Glucose reference range applies only to samples taken after fasting for at least 8 hours.   Glucose-Capillary 03/04/2023 286 (H)  70 - 99 mg/dL Final   Glucose reference range applies only to samples taken after fasting for at least 8 hours.   Glucose-Capillary 03/03/2023 175 (H)  70 - 99 mg/dL Final   Glucose reference range applies only to samples taken after fasting for at least 8 hours.   Glucose-Capillary 03/04/2023 182 (H)  70 - 99 mg/dL Final   Glucose reference range applies only to samples taken after fasting for at least 8 hours.   Glucose-Capillary 03/05/2023 254 (H)  70 - 99 mg/dL Final   Glucose reference range applies only to samples taken after fasting for at least 8 hours.   Glucose-Capillary 03/05/2023 163 (H)  70 - 99 mg/dL Final   Glucose reference range applies only to samples taken after fasting for at least 8 hours.   Glucose-Capillary 03/05/2023 242 (H)  70 - 99 mg/dL Final   Glucose reference range applies only to samples taken after fasting for at least 8 hours.   Glucose-Capillary 03/05/2023 387 (H)  70 - 99 mg/dL Final   Glucose reference range applies only to samples taken after fasting for at least 8 hours.   Glucose-Capillary 03/06/2023 232 (H)  70 - 99  mg/dL Final   Glucose reference range applies only to samples taken after fasting for at least 8 hours.   Glucose-Capillary 03/06/2023 231 (H)  70 - 99 mg/dL Final   Glucose reference range applies only to samples taken after fasting for at least 8 hours.  Admission on 03/01/2023, Discharged on 03/03/2023  Component Date Value Ref Range Status   WBC 03/02/2023 5.7  4.0 - 10.5 K/uL Final   RBC 03/02/2023 4.71  4.22 - 5.81 MIL/uL Final   Hemoglobin 03/02/2023 13.7  13.0 - 17.0 g/dL Final   HCT 95/28/4132 43.7  39.0 -  52.0 % Final   MCV 03/02/2023 92.8  80.0 - 100.0 fL Final   MCH 03/02/2023 29.1  26.0 - 34.0 pg Final   MCHC 03/02/2023 31.4  30.0 - 36.0 g/dL Final   RDW 13/24/4010 14.1  11.5 - 15.5 % Final   Platelets 03/02/2023 256  150 - 400 K/uL Final   nRBC 03/02/2023 0.0  0.0 - 0.2 % Final   Neutrophils Relative % 03/02/2023 53  % Final   Neutro Abs 03/02/2023 3.1  1.7 - 7.7 K/uL Final   Lymphocytes Relative 03/02/2023 32  % Final   Lymphs Abs 03/02/2023 1.8  0.7 - 4.0 K/uL Final   Monocytes Relative 03/02/2023 11  % Final   Monocytes Absolute 03/02/2023 0.6  0.1 - 1.0 K/uL Final   Eosinophils Relative 03/02/2023 2  % Final   Eosinophils Absolute 03/02/2023 0.1  0.0 - 0.5 K/uL Final   Basophils Relative 03/02/2023 1  % Final   Basophils Absolute 03/02/2023 0.0  0.0 - 0.1 K/uL Final   Immature Granulocytes 03/02/2023 1  % Final   Abs Immature Granulocytes 03/02/2023 0.04  0.00 - 0.07 K/uL Final   Performed at [facility], [address], [place], [place] [address]   Sodium 03/02/2023 137  135 - 145 mmol/L Final   Potassium 03/02/2023 5.0  3.5 - 5.1 mmol/L Final   Chloride 03/02/2023 100  98 - 111 mmol/L Final   CO2 03/02/2023 27  22 - 32 mmol/L Final   Glucose, Bld 03/02/2023 424 (H)  70 - 99 mg/dL Final   Glucose reference range applies only to samples taken after fasting for at least 8 hours.   BUN 03/02/2023 14  6 - 20 mg/dL Final   Creatinine, Ser 03/02/2023 0.90  0.61  - 1.24 mg/dL Final   Calcium 66/44/0347 9.3  8.9 - 10.3 mg/dL Final   Total Protein 42/59/5638 6.5  6.5 - 8.1 g/dL Final   Albumin 75/64/3329 3.6  3.5 - 5.0 g/dL Final   AST 51/88/4166 13 (L)  15 - 41 U/L Final   ALT 03/02/2023 16  0 - 44 U/L Final   Alkaline Phosphatase 03/02/2023 65  38 - 126 U/L Final   Total Bilirubin 03/02/2023 0.5  0.3 - 1.2 mg/dL Final   GFR, Estimated 03/02/2023 >60  >60 mL/min Final   Comment: (NOTE) Calculated using the CKD-EPI Creatinine Equation (2021)    Anion gap 03/02/2023 10  5 - 15 Final   Performed at [facility], [address], [place], [place] [address]   Hgb A1c MFr Bld 03/02/2023 10.9 (H)  4.8 - 5.6 % Final   Comment: (NOTE) Pre diabetes:          5.7%-6.4%  Diabetes:              >6.4%  Glycemic control for   <7.0% adults with diabetes    Mean Plasma Glucose 03/02/2023 266.13  mg/dL Final   Performed at [facility], [address], [place], [place] [address]   Alcohol, Ethyl (B) 03/02/2023 <10  <10 mg/dL Final   Comment: (NOTE) Lowest detectable limit for serum alcohol is 10 mg/dL.  For medical purposes only. Performed at [facility], [address] [place], [place] [address]    TSH 03/02/2023 1.178  0.350 - 4.500 uIU/mL Final   Comment: Performed by a 3rd Generation assay with a functional sensitivity of <=0.01 uIU/mL. Performed at [facility], [address], [place], [place] [address]    POC Amphetamine UR 03/02/2023 None Detected  NONE DETECTED (Cut Off Level 1000 ng/mL) Final   POC Secobarbital (BAR) 03/02/2023 None Detected  NONE DETECTED (Cut Off Level 300 ng/mL) Final   POC Buprenorphine (BUP) 03/02/2023 None Detected  NONE DETECTED (Cut Off Level 10 ng/mL) Final   POC Oxazepam (BZO) 03/02/2023 None Detected  NONE DETECTED (Cut Off Level 300 ng/mL) Final   POC Cocaine UR 03/02/2023 Positive (A)  NONE DETECTED (Cut Off Level 300 ng/mL) Final   POC Methamphetamine UR 03/02/2023 None Detected  NONE  DETECTED (Cut Off Level 1000 ng/mL) Final   POC Morphine 03/02/2023 None Detected  NONE DETECTED (Cut Off Level 300 ng/mL) Final   POC Methadone UR 03/02/2023 None Detected  NONE DETECTED (Cut Off Level 300 ng/mL) Final   POC Oxycodone UR 03/02/2023 None Detected  NONE DETECTED (Cut Off Level 100 ng/mL) Final   POC Marijuana UR 03/02/2023 Positive (A)  NONE DETECTED (Cut Off Level 50 ng/mL) Final   Glucose-Capillary 03/02/2023 499 (H)  70 - 99 mg/dL Final   Glucose reference range applies only to samples taken after fasting for at least 8 hours.   Glucose-Capillary 03/02/2023 404 (H)  70 - 99 mg/dL Final   Glucose reference range applies only to samples taken after fasting for at least 8 hours.   Glucose-Capillary 03/02/2023 214 (H)  70 - 99 mg/dL Final   Glucose reference range applies only to samples taken after fasting for at least 8 hours.   Glucose-Capillary 03/02/2023 265 (H)  70 - 99 mg/dL Final   Glucose reference range applies only to samples taken after fasting for at least 8 hours.   Glucose-Capillary 03/03/2023 263 (H)  70 - 99 mg/dL Final   Glucose reference range applies only to samples taken after fasting for at least 8 hours.   Glucose-Capillary 03/03/2023 258 (H)  70 - 99 mg/dL Final   Glucose reference range applies only to samples taken after fasting for at least 8 hours.  Admission on 02/05/2023, Discharged on 02/05/2023  Component Date Value Ref Range Status   Glucose-Capillary 02/05/2023 380 (H)  70 - 99 mg/dL Final   Glucose reference range applies only to samples taken after fasting for at least 8 hours.   Sodium 02/05/2023 138  135 - 145 mmol/L Final   Potassium 02/05/2023 4.0  3.5 - 5.1 mmol/L Final   Chloride 02/05/2023 103  98 - 111 mmol/L Final   CO2 02/05/2023 26  22 - 32 mmol/L Final   Glucose, Bld 02/05/2023 380 (H)  70 - 99 mg/dL Final   Glucose reference range applies only to samples taken after fasting for at least 8 hours.   BUN 02/05/2023 11  6 - 20  mg/dL Final   Creatinine, Ser 02/05/2023 0.92  0.61 - 1.24 mg/dL Final   Calcium 60/45/4098 9.1  8.9 - 10.3 mg/dL Final   GFR, Estimated 02/05/2023 >60  >60 mL/min Final   Comment: (NOTE) Calculated using the CKD-EPI Creatinine Equation (2021)    Anion gap 02/05/2023 9  5 - 15 Final   Performed at [facility], [address], [place], [place] [address]   WBC 02/05/2023 7.2  4.0 - 10.5 K/uL Final   RBC 02/05/2023 5.40  4.22 - 5.81 MIL/uL Final   Hemoglobin 02/05/2023 16.0  13.0 - 17.0 g/dL Final   HCT 78/29/5621 49.7  39.0 - 52.0 % Final   MCV 02/05/2023 92.0  80.0 - 100.0 fL Final   MCH 02/05/2023 29.6  26.0 - 34.0 pg Final   MCHC 02/05/2023 32.2  30.0 - 36.0 g/dL Final   RDW 78/29/5621 13.9  11.5 - 15.5 % Final   Platelets 02/05/2023 266  150 - 400 K/uL Final   nRBC 02/05/2023 0.0  0.0 - 0.2 % Final   Performed at [facility], [address] [place], [place] [address]   Glucose-Capillary 02/05/2023 342 (H)  70 - 99 mg/dL Final   Glucose reference range applies only to samples taken after fasting for at least 8 hours.   Total Protein 02/05/2023 7.5  6.5 - 8.1 g/dL Final   Albumin 78/46/9629 4.1  3.5 - 5.0 g/dL Final   AST 52/84/1324 16  15 - 41 U/L Final   ALT 02/05/2023 12  0 - 44 U/L Final   Alkaline Phosphatase 02/05/2023 83  38 - 126 U/L Final   Total Bilirubin 02/05/2023 1.2  0.3 - 1.2 mg/dL Final   Bilirubin, Direct 02/05/2023 0.3 (H)  0.0 - 0.2 mg/dL Final   Indirect Bilirubin 02/05/2023 0.9  0.3 - 0.9 mg/dL Final   Performed at [facility], [address], [place], [place] [address]   Lipase 02/05/2023 36  11 - 51 U/L Final   Performed at [facility], [address] [place], [place] [address]   pH, Ven 02/05/2023 7.352  7.25 - 7.43 Final   pCO2, Ven 02/05/2023 48.6  44 - 60 mmHg Final   pO2, Ven 02/05/2023 28 (LL)  32 - 45 mmHg Final   Bicarbonate 02/05/2023 26.9  20.0 - 28.0 mmol/L Final   TCO2 02/05/2023 28  22 - 32 mmol/L Final   O2  Saturation 02/05/2023 49  % Final   Acid-Base Excess 02/05/2023 0.0  0.0 - 2.0 mmol/L Final   Sodium 02/05/2023 140  135 - 145 mmol/L Final   Potassium 02/05/2023 3.9  3.5 - 5.1 mmol/L Final   Calcium, Ion 02/05/2023 1.22  1.15 - 1.40 mmol/L Final   HCT 02/05/2023 50.0  39.0 - 52.0 % Final   Hemoglobin 02/05/2023 17.0  13.0 - 17.0 g/dL Final   Sample type 64/40/3474 VENOUS   Final   Comment 02/05/2023 NOTIFIED PHYSICIAN   Final   Beta-Hydroxybutyric Acid 02/05/2023 0.21  0.05 - 0.27 mmol/L Final   Performed at [facility], [address] [place], [place] [address]   Glucose-Capillary 02/05/2023 213 (H)  70 - 99 mg/dL Final   Glucose reference range applies only to samples taken after fasting for at least 8 hours.  Admission on 01/27/2023, Discharged on 01/27/2023  Component Date Value Ref Range Status   Glucose-Capillary 01/27/2023 454 (H)  70 - 99 mg/dL Final   Glucose reference range applies only to samples taken after fasting for at least 8 hours.   WBC 01/27/2023 5.3  4.0 - 10.5 K/uL Final   RBC 01/27/2023 4.77  4.22 - 5.81 MIL/uL Final   Hemoglobin 01/27/2023 14.2  13.0 - 17.0 g/dL Final   HCT 38/75/6433 43.5  39.0 - 52.0 % Final   MCV 01/27/2023 91.2  80.0 - 100.0 fL Final   MCH 01/27/2023 29.8  26.0 - 34.0 pg Final   MCHC 01/27/2023 32.6  30.0 - 36.0 g/dL Final   RDW 29/51/8841 13.9  11.5 - 15.5 % Final   Platelets 01/27/2023 238  150 - 400 K/uL Final   nRBC 01/27/2023 0.0  0.0 - 0.2 %  Final   Neutrophils Relative % 01/27/2023 68  % Final   Neutro Abs 01/27/2023 3.5  1.7 - 7.7 K/uL Final   Lymphocytes Relative 01/27/2023 23  % Final   Lymphs Abs 01/27/2023 1.2  0.7 - 4.0 K/uL Final   Monocytes Relative 01/27/2023 9  % Final   Monocytes Absolute 01/27/2023 0.5  0.1 - 1.0 K/uL Final   Eosinophils Relative 01/27/2023 0  % Final   Eosinophils Absolute 01/27/2023 0.0  0.0 - 0.5 K/uL Final   Basophils Relative 01/27/2023 0  % Final   Basophils Absolute 01/27/2023 0.0  0.0  - 0.1 K/uL Final   Immature Granulocytes 01/27/2023 0  % Final   Abs Immature Granulocytes 01/27/2023 0.02  0.00 - 0.07 K/uL Final   Performed at [facility], [address] [place], [place] [address]   Sodium 01/27/2023 133 (L)  135 - 145 mmol/L Final   Potassium 01/27/2023 4.5  3.5 - 5.1 mmol/L Final   Chloride 01/27/2023 99  98 - 111 mmol/L Final   CO2 01/27/2023 25  22 - 32 mmol/L Final   Glucose, Bld 01/27/2023 410 (H)  70 - 99 mg/dL Final   Glucose reference range applies only to samples taken after fasting for at least 8 hours.   BUN 01/27/2023 17  6 - 20 mg/dL Final   Creatinine, Ser 01/27/2023 0.89  0.61 - 1.24 mg/dL Final   Calcium 40/98/1191 8.5 (L)  8.9 - 10.3 mg/dL Final   Total Protein 47/82/9562 6.8  6.5 - 8.1 g/dL Final   Albumin 13/04/6577 3.6  3.5 - 5.0 g/dL Final   AST 46/96/2952 19  15 - 41 U/L Final   ALT 01/27/2023 15  0 - 44 U/L Final   Alkaline Phosphatase 01/27/2023 60  38 - 126 U/L Final   Total Bilirubin 01/27/2023 0.7  0.3 - 1.2 mg/dL Final   GFR, Estimated 01/27/2023 >60  >60 mL/min Final   Comment: (NOTE) Calculated using the CKD-EPI Creatinine Equation (2021)    Anion gap 01/27/2023 9  5 - 15 Final   Performed at [facility], [address], [place], [place] [address]   Glucose-Capillary 01/27/2023 339 (H)  70 - 99 mg/dL Final   Glucose reference range applies only to samples taken after fasting for at least 8 hours.  Admission on 12/23/2022, Discharged on 12/23/2022  Component Date Value Ref Range Status   Glucose-Capillary 12/23/2022 339 (H)  70 - 99 mg/dL Final   Glucose reference range applies only to samples taken after fasting for at least 8 hours.   Comment 1 12/23/2022 Notify RN   Final   Sodium 12/23/2022 132 (L)  135 - 145 mmol/L Final   Potassium 12/23/2022 4.0  3.5 - 5.1 mmol/L Final   Chloride 12/23/2022 102  98 - 111 mmol/L Final   CO2 12/23/2022 26  22 - 32 mmol/L Final   Glucose, Bld 12/23/2022  351 (H)  70 - 99 mg/dL Final   Glucose reference range applies only to samples taken after fasting for at least 8 hours.   BUN 12/23/2022 13  6 - 20 mg/dL Final   Creatinine, Ser 12/23/2022 0.70  0.61 - 1.24 mg/dL Final   Calcium 44/09/270 8.3 (L)  8.9 - 10.3 mg/dL Final   GFR, Estimated 12/23/2022 >60  >60 mL/min Final   Comment: (NOTE) Calculated using the CKD-EPI Creatinine Equation (2021)    Anion gap 12/23/2022 4 (L)  5 - 15 Final  Performed at [facility], [address], [place], [place] [address]   WBC 12/23/2022 6.9  4.0 - 10.5 K/uL Final   RBC 12/23/2022 4.60  4.22 - 5.81 MIL/uL Final   Hemoglobin 12/23/2022 13.6  13.0 - 17.0 g/dL Final   HCT 60/45/4098 42.7  39.0 - 52.0 % Final   MCV 12/23/2022 92.8  80.0 - 100.0 fL Final   MCH 12/23/2022 29.6  26.0 - 34.0 pg Final   MCHC 12/23/2022 31.9  30.0 - 36.0 g/dL Final   RDW 11/91/4782 13.4  11.5 - 15.5 % Final   Platelets 12/23/2022 268  150 - 400 K/uL Final   nRBC 12/23/2022 0.0  0.0 - 0.2 % Final   Performed at [facility], [address] [place], [place] [address]   Color, Urine 12/23/2022 YELLOW  YELLOW Final   APPearance 12/23/2022 CLEAR  CLEAR Final   Specific Gravity, Urine 12/23/2022 1.026  1.005 - 1.030 Final   pH 12/23/2022 7.0  5.0 - 8.0 Final   Glucose, UA 12/23/2022 >=500 (A)  NEGATIVE mg/dL Final   Hgb urine dipstick 12/23/2022 NEGATIVE  NEGATIVE Final   Bilirubin Urine 12/23/2022 NEGATIVE  NEGATIVE Final   Ketones, ur 12/23/2022 NEGATIVE  NEGATIVE mg/dL Final   Protein, ur 30/86/5784 NEGATIVE  NEGATIVE mg/dL Final   Nitrite 69/62/9528 NEGATIVE  NEGATIVE Final   Leukocytes,Ua 12/23/2022 NEGATIVE  NEGATIVE Final   RBC / HPF 12/23/2022 0-5  0 - 5 RBC/hpf Final   WBC, UA 12/23/2022 0-5  0 - 5 WBC/hpf Final   Bacteria, UA 12/23/2022 NONE SEEN  NONE SEEN Final   Squamous Epithelial / HPF 12/23/2022 0-5  0 - 5 /HPF Final   Performed at [facility], [address], [place], [place] [address]   Glucose-Capillary 12/23/2022 295 (H)  70 - 99 mg/dL Final   Glucose reference range applies only to samples taken after fasting for at least 8 hours.   Glucose-Capillary 12/23/2022 231 (H)  70 - 99 mg/dL Final   Glucose reference range applies only to samples taken after fasting for at least 8 hours.  Office Visit on 11/30/2022  Component Date Value Ref Range Status   Creatinine, Urine 11/30/2022 108.2  Not Estab. mg/dL Final   Microalbumin, Urine 11/30/2022 12.1  Not Estab. ug/mL Final   Microalb/Creat Ratio 11/30/2022 11  0 - 29 mg/g creat Final   Comment:                        Normal:                0 -  29                        Moderately increased: 30 - 300                        Severely increased:       >300    Glucose 11/30/2022 49 (L)  70 - 99 mg/dL Final   BUN 40/06/2724 11  6 - 24 mg/dL Final   Creatinine, Ser 11/30/2022 0.77  0.76 - 1.27 mg/dL Final   eGFR 36/64/4034 108  >59 mL/min/1.73 Final   BUN/Creatinine Ratio 11/30/2022 14  9 - 20 Final   Sodium 11/30/2022 144  134 - 144 mmol/L Final   Potassium 11/30/2022 5.2  3.5 - 5.2 mmol/L Final  Chloride 11/30/2022 104  96 - 106 mmol/L Final   CO2 11/30/2022 26  20 - 29 mmol/L Final   Calcium 11/30/2022 9.3  8.7 - 10.2 mg/dL Final   Total Protein 95/63/8756 7.0  6.0 - 8.5 g/dL Final   Albumin 43/32/9518 4.2  3.8 - 4.9 g/dL Final   Globulin, Total 11/30/2022 2.8  1.5 - 4.5 g/dL Final   Albumin/Globulin Ratio 11/30/2022 1.5  1.2 - 2.2 Final   Bilirubin Total 11/30/2022 0.2  0.0 - 1.2 mg/dL Final   Alkaline Phosphatase 11/30/2022 66  44 - 121 IU/L Final   AST 11/30/2022 16  0 - 40 IU/L Final   ALT 11/30/2022 13  0 - 44 IU/L Final   Hemoglobin A1C 11/30/2022 9.0 (A)  4.0 - 5.6 % Final   POC Glucose 11/30/2022 60 (A)  70 - 99 mg/dl Final   WBC 84/16/6063 7.7  3.4 - 10.8 x10E3/uL Final   RBC 11/30/2022 4.94  4.14 - 5.80 x10E6/uL Final   Hemoglobin 11/30/2022 14.6  13.0 - 17.7 g/dL Final    Hematocrit 01/60/1093 45.2  37.5 - 51.0 % Final   MCV 11/30/2022 92  79 - 97 fL Final   MCH 11/30/2022 29.6  26.6 - 33.0 pg Final   MCHC 11/30/2022 32.3  31.5 - 35.7 g/dL Final   RDW 23/55/7322 12.7  11.6 - 15.4 % Final   Platelets 11/30/2022 269  150 - 450 x10E3/uL Final   Neutrophils 11/30/2022 61  Not Estab. % Final   Lymphs 11/30/2022 28  Not Estab. % Final   Monocytes 11/30/2022 10  Not Estab. % Final   Eos 11/30/2022 1  Not Estab. % Final   Basos 11/30/2022 0  Not Estab. % Final   Neutrophils Absolute 11/30/2022 4.6  1.4 - 7.0 x10E3/uL Final   Lymphocytes Absolute 11/30/2022 2.2  0.7 - 3.1 x10E3/uL Final   Monocytes Absolute 11/30/2022 0.8  0.1 - 0.9 x10E3/uL Final   EOS (ABSOLUTE) 11/30/2022 0.1  0.0 - 0.4 x10E3/uL Final   Basophils Absolute 11/30/2022 0.0  0.0 - 0.2 x10E3/uL Final   Immature Granulocytes 11/30/2022 0  Not Estab. % Final   Immature Grans (Abs) 11/30/2022 0.0  0.0 - 0.1 x10E3/uL Final   Cholesterol, Total 11/30/2022 112  100 - 199 mg/dL Final   Triglycerides 02/54/2706 82  0 - 149 mg/dL Final   HDL 23/76/2831 52  >39 mg/dL Final   VLDL Cholesterol Cal 11/30/2022 16  5 - 40 mg/dL Final   LDL Chol Calc (NIH) 11/30/2022 44  0 - 99 mg/dL Final   Chol/HDL Ratio 11/30/2022 2.2  0.0 - 5.0 ratio Final   Comment:                                   T. Chol/HDL Ratio                                             Men  Women                               1/2 Avg.Risk  3.4    3.3  Avg.Risk  5.0    4.4                                2X Avg.Risk  9.6    7.1                                3X Avg.Risk 23.4   11.0   Admission on 10/28/2022, Discharged on 10/28/2022  Component Date Value Ref Range Status   WBC 10/28/2022 5.9  4.0 - 10.5 K/uL Final   RBC 10/28/2022 4.74  4.22 - 5.81 MIL/uL Final   Hemoglobin 10/28/2022 14.2  13.0 - 17.0 g/dL Final   HCT 16/06/9603 44.3  39.0 - 52.0 % Final   MCV 10/28/2022 93.5  80.0 - 100.0 fL Final   MCH  10/28/2022 30.0  26.0 - 34.0 pg Final   MCHC 10/28/2022 32.1  30.0 - 36.0 g/dL Final   RDW 54/05/8118 13.9  11.5 - 15.5 % Final   Platelets 10/28/2022 244  150 - 400 K/uL Final   nRBC 10/28/2022 0.0  0.0 - 0.2 % Final   Neutrophils Relative % 10/28/2022 72  % Final   Neutro Abs 10/28/2022 4.3  1.7 - 7.7 K/uL Final   Lymphocytes Relative 10/28/2022 19  % Final   Lymphs Abs 10/28/2022 1.1  0.7 - 4.0 K/uL Final   Monocytes Relative 10/28/2022 8  % Final   Monocytes Absolute 10/28/2022 0.5  0.1 - 1.0 K/uL Final   Eosinophils Relative 10/28/2022 1  % Final   Eosinophils Absolute 10/28/2022 0.0  0.0 - 0.5 K/uL Final   Basophils Relative 10/28/2022 0  % Final   Basophils Absolute 10/28/2022 0.0  0.0 - 0.1 K/uL Final   Immature Granulocytes 10/28/2022 0  % Final   Abs Immature Granulocytes 10/28/2022 0.01  0.00 - 0.07 K/uL Final   Performed at [facility], [address] [place], [place] [address]   Lipase 10/28/2022 137 (H)  11 - 51 U/L Final   Performed at [facility], [address] [place], [place] [address]   Opiates 10/28/2022 NONE DETECTED  NONE DETECTED Final   Cocaine 10/28/2022 POSITIVE (A)  NONE DETECTED Final   Benzodiazepines 10/28/2022 NONE DETECTED  NONE DETECTED Final   Amphetamines 10/28/2022 NONE DETECTED  NONE DETECTED Final   Tetrahydrocannabinol 10/28/2022 NONE DETECTED  NONE DETECTED Final   Barbiturates 10/28/2022 NONE DETECTED  NONE DETECTED Final   Comment: (NOTE) DRUG SCREEN FOR MEDICAL PURPOSES ONLY.  IF CONFIRMATION IS NEEDED FOR ANY PURPOSE, NOTIFY LAB WITHIN 5 DAYS.  LOWEST DETECTABLE LIMITS FOR URINE DRUG SCREEN Drug Class                     Cutoff (ng/mL) Amphetamine and metabolites    1000 Barbiturate and metabolites    200 Benzodiazepine                 200 Opiates and metabolites        300 Cocaine and metabolites        300 THC                            50 Performed at [facility], [address], [place],  [place] [address]    Alcohol, Ethyl (B) 10/28/2022 <10  <  10 mg/dL Final   Comment: (NOTE) Lowest detectable limit for serum alcohol is 10 mg/dL.  For medical purposes only. Performed at [facility], [address], [place], [place] [address]    Sodium 10/28/2022 135  135 - 145 mmol/L Final   Potassium 10/28/2022 4.0  3.5 - 5.1 mmol/L Final   Chloride 10/28/2022 99  98 - 111 mmol/L Final   CO2 10/28/2022 23  22 - 32 mmol/L Final   Glucose, Bld 10/28/2022 273 (H)  70 - 99 mg/dL Final   Glucose reference range applies only to samples taken after fasting for at least 8 hours.   BUN 10/28/2022 7  6 - 20 mg/dL Final   Creatinine, Ser 10/28/2022 0.69  0.61 - 1.24 mg/dL Final   Calcium 13/24/4010 9.0  8.9 - 10.3 mg/dL Final   Total Protein 27/25/3664 7.2  6.5 - 8.1 g/dL Final   Albumin 40/34/7425 3.8  3.5 - 5.0 g/dL Final   AST 95/63/8756 22  15 - 41 U/L Final   ALT 10/28/2022 17  0 - 44 U/L Final   Alkaline Phosphatase 10/28/2022 65  38 - 126 U/L Final   Total Bilirubin 10/28/2022 0.3  0.3 - 1.2 mg/dL Final   GFR, Estimated 10/28/2022 >60  >60 mL/min Final   Comment: (NOTE) Calculated using the CKD-EPI Creatinine Equation (2021)    Anion gap 10/28/2022 13  5 - 15 Final   Performed at [facility], [address], [place], [place] [address]   Salicylate Lvl 10/28/2022 <7.0 (L)  7.0 - 30.0 mg/dL Final   Performed at [facility], [address], [place], [place] [address]   Acetaminophen (Tylenol), Serum 10/28/2022 <10 (L)  10 - 30 ug/mL Final   Comment: (NOTE) Therapeutic concentrations vary significantly. A range of 10-30 ug/mL  may be an effective concentration for many patients. However, some  are best treated at concentrations outside of this range. Acetaminophen concentrations >150 ug/mL at 4 hours after ingestion  and >50 ug/mL at 12 hours after ingestion are often associated with  toxic reactions.  Performed at [facility], [address] [place],  [place] [address]   Admission on 10/10/2022, Discharged on 10/10/2022  Component Date Value Ref Range Status   Glucose-Capillary 10/10/2022 >600 (HH)  70 - 99 mg/dL Final   Glucose reference range applies only to samples taken after fasting for at least 8 hours.   Sodium 10/10/2022 129 (L)  135 - 145 mmol/L Final   Potassium 10/10/2022 4.4  3.5 - 5.1 mmol/L Final   Chloride 10/10/2022 95 (L)  98 - 111 mmol/L Final   CO2 10/10/2022 24  22 - 32 mmol/L Final   Glucose, Bld 10/10/2022 683 (HH)  70 - 99 mg/dL Final   Comment: CRITICAL RESULT CALLED TO, READ BACK BY AND VERIFIED WITH LAMB,S. RN AT 1219 10/10/22 MULLINS,T Glucose reference range applies only to samples taken after fasting for at least 8 hours.    BUN 10/10/2022 16  6 - 20 mg/dL Final   Creatinine, Ser 10/10/2022 0.93  0.61 - 1.24 mg/dL Final   Calcium 30/16/0109 8.4 (L)  8.9 - 10.3 mg/dL Final   Total Protein 32/35/5732 6.9  6.5 - 8.1 g/dL Final   Albumin 20/25/4270 3.6  3.5 - 5.0 g/dL Final   AST 62/37/6283 16  15 - 41 U/L Final   ALT 10/10/2022 11  0 - 44 U/L Final   Alkaline Phosphatase 10/10/2022 61  38 - 126 U/L Final   Total Bilirubin 10/10/2022 0.7  0.3 - 1.2 mg/dL Final   GFR, Estimated 10/10/2022 >60  >60 mL/min Final   Comment: (NOTE) Calculated using the CKD-EPI Creatinine Equation (2021)    Anion gap 10/10/2022 10  5 - 15 Final   Performed at [facility], [address], [place], [place] [address]   Color, Urine 10/10/2022 STRAW (A)  YELLOW Final   APPearance 10/10/2022 CLEAR  CLEAR Final   Specific Gravity, Urine 10/10/2022 1.031 (H)  1.005 - 1.030 Final   pH 10/10/2022 5.0  5.0 - 8.0 Final   Glucose, UA 10/10/2022 >=500 (A)  NEGATIVE mg/dL Final   Hgb urine dipstick 10/10/2022 NEGATIVE  NEGATIVE Final   Bilirubin Urine 10/10/2022 NEGATIVE  NEGATIVE Final   Ketones, ur 10/10/2022 NEGATIVE  NEGATIVE mg/dL Final   Protein, ur 82/95/6213 NEGATIVE  NEGATIVE mg/dL Final   Nitrite 08/65/7846  NEGATIVE  NEGATIVE Final   Leukocytes,Ua 10/10/2022 NEGATIVE  NEGATIVE Final   RBC / HPF 10/10/2022 0-5  0 - 5 RBC/hpf Final   WBC, UA 10/10/2022 0-5  0 - 5 WBC/hpf Final   Bacteria, UA 10/10/2022 NONE SEEN  NONE SEEN Final   Squamous Epithelial / HPF 10/10/2022 0-5  0 - 5 /HPF Final   Performed at [facility], [address], [place], [place] [address]   Beta-Hydroxybutyric Acid 10/10/2022 0.41 (H)  0.05 - 0.27 mmol/L Final   Performed at [facility], [address] [place], [place] [address]   pH, Ven 10/10/2022 7.39  7.25 - 7.43 Final   pCO2, Ven 10/10/2022 42 (L)  44 - 60 mmHg Final   pO2, Ven 10/10/2022 59 (H)  32 - 45 mmHg Final   Bicarbonate 10/10/2022 25.4  20.0 - 28.0 mmol/L Final   Acid-Base Excess 10/10/2022 0.3  0.0 - 2.0 mmol/L Final   O2 Saturation 10/10/2022 92.1  % Final   Dakota Kim temperature 10/10/2022 37.0   Final   Performed at [facility], [address], [place], [place] [address]   Osmolality 10/10/2022 315 (H)  275 - 295 mOsm/kg Final   Comment: REPEATED TO VERIFY Performed at [facility], [address] [place], [place] [address]    WBC 10/10/2022 5.3  4.0 - 10.5 K/uL Final   RBC 10/10/2022 4.89  4.22 - 5.81 MIL/uL Final   Hemoglobin 10/10/2022 14.7  13.0 - 17.0 g/dL Final   HCT 36/64/4034 44.6  39.0 - 52.0 % Final   MCV 10/10/2022 91.2  80.0 - 100.0 fL Final   MCH 10/10/2022 30.1  26.0 - 34.0 pg Final   MCHC 10/10/2022 33.0  30.0 - 36.0 g/dL Final   RDW 74/25/9563 13.1  11.5 - 15.5 % Final   Platelets 10/10/2022 235  150 - 400 K/uL Final   nRBC 10/10/2022 0.0  0.0 - 0.2 % Final   Performed at [facility], [address] [place], [place] [address]   Glucose-Capillary 10/10/2022 480 (H)  70 - 99 mg/dL Final   Glucose reference range applies only to samples taken after fasting for at least 8 hours.    Blood Alcohol level:  Lab Results  Component Value Date   ETH  <10 03/02/2023   ETH <10 10/28/2022    Metabolic Disorder Labs: Lab Results  Component Value Date   HGBA1C 10.9 (H) 03/02/2023   MPG 266.13 03/02/2023   MPG 231.69 03/22/2021   No results  found for: "PROLACTIN" Lab Results  Component Value Date   CHOL 112 11/30/2022   TRIG 82 11/30/2022   HDL 52 11/30/2022   CHOLHDL 2.2 11/30/2022   VLDL 21 04/10/2017   LDLCALC 44 11/30/2022   LDLCALC 99 03/24/2021    Therapeutic Lab Levels: No results found for: "LITHIUM" No results found for: "VALPROATE" No results found for: "CBMZ"  Physical Findings   GAD-7    Flowsheet Row Office Visit from 11/30/2022 in [facility]  Total GAD-7 Score 12      PHQ2-9    Flowsheet Row ED from 03/03/2023 in [facility] Office Visit from 11/30/2022 in [facility] Office Visit from 03/24/2021 in [facility] Visit from 08/23/2018 in [facility] Office Visit from 05/08/2017 in [facility]  PHQ-2 Total Score 3 0 0 0 0  PHQ-9 Total Score 7 3 -- -- --      Flowsheet Row ED from 03/03/2023 in [facility] ED from 03/01/2023 in [facility] ED from 02/05/2023 in [facility] Emergency Department at [facility]  C-SSRS RISK CATEGORY No Risk High Risk No Risk        Musculoskeletal  Strength & Muscle Tone: within normal limits Gait & Station: normal Dakota Kim leans: N/A  Psychiatric Specialty Exam  Presentation  General Appearance:  Appropriate for Environment; Casual  Eye Contact: Good  Speech: Clear and Coherent; Normal Rate  Speech Volume: Normal  Handedness: Right   Mood and Affect  Mood: Depressed  Affect: Full Range   Thought Process  Thought Processes: Coherent; Linear  Descriptions of Associations:Intact  Orientation:Full (Time, Place and  Person)  Thought Content:Logical; WDL  Diagnosis of Schizophrenia or Schizoaffective disorder in past: No    Hallucinations:Hallucinations: None  Ideas of Reference:None  Suicidal Thoughts:Suicidal Thoughts: No  Homicidal Thoughts:Homicidal Thoughts: No   Sensorium  Memory: Immediate Good; Recent Good  Judgment: Good  Insight: Good   Executive Functions  Concentration: Good  Attention Span: Good  Recall: Good  Fund of Knowledge: Good  Language: Good   Psychomotor Activity  Psychomotor Activity: Psychomotor Activity: Normal   Assets  Assets: Communication Skills; Desire for Improvement; Social Support   Sleep  Sleep: Sleep: Fair   No data recorded   Physical Exam  Physical Exam Vitals and nursing note reviewed.  Constitutional:      Appearance: Normal appearance.  Neurological:     Mental Status: He is alert and oriented to person, place, and time.  Psychiatric:        Mood and Affect: Mood normal.        Behavior: Behavior normal.        Thought Content: Thought content normal.   Review of Systems  Respiratory: Negative.    Cardiovascular: Negative.   Psychiatric/Behavioral:  Positive for substance abuse. Negative for depression.   All other systems reviewed and are negative.  Blood pressure 122/84, pulse 63, temperature 97.9 F (36.6 C), temperature source Oral, resp. rate 17, SpO2 100 %. There is no height or weight on file to calculate BMI.  Treatment Plan Summary: Daily contact with Dakota Kim to assess and evaluate symptoms and progress in treatment and Medication management  Dakota Kim is a 52 year old male with no formal psychiatric diagnoses prior to admission but who meets criteria for cocaine use disorder, nicotine use disorder, substance-induced mood disorder,  and prolonged grief who presented voluntarily to [facility] for detox and substance use treatment, for which he was admitted to [facility]. Dakota Kim remains motivated for  residential substance use treatment and has good insight into his triggers as well as what worked for him in the past in maintaining sobriety. Although he is experiencing depressive sx 2/2 his prolonged grief, Dakota Kim declined initiating psychotropics and would instead like resources for therapy upon discharge. LCSW assisting with dispo planning.   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 3 times daily as needed Continue trazodone 50 mg p.o. nightly as needed Continue gabapentin 300 mg p.o. 3 times daily Nicotine patch 14 mg daily PRN  #T2DM Continue glipizide 5 mg p.o. daily Continue insulin coverage Semglee 20 units daily Continue NovoLog 10 units meal coverage See chart for sliding scale  #Hyperlipidemia Continue Lipitor 10 mg daily  Dakota Kim encouraged to participate within the therapeutic milieu   Dispo: Pending  Lamar Sprinkles, MD 03/06/2023 8:56 AM

## 2023-03-06 NOTE — Group Note (Signed)
Group Topic: Emotional Regulation  Group Date: 03/06/2023 Start Time: 0800 End Time: 0830 Facilitators: Emmit Pomfret D, NT  Department: [facility]  Number of Participants: 7  Group Focus: affirmation Treatment Modality:  Psychoeducation Interventions utilized were exploration Purpose: regain self-worth  Name: Dakota Kim Date of Birth: May 09, 1971  MR: [id number]    Level of Participation: moderate Quality of Participation: attentive Interactions with others: gave feedback Mood/Affect: appropriate Triggers (if applicable): n/a Cognition: concrete Progress: Significant Response: n/a Plan: follow-up needed  Patients Problems:  Patient Active Problem List   Diagnosis Date Noted   Prolonged grief disorder 03/05/2023   Polysubstance abuse (HCC) 03/03/2023   Substance induced mood disorder (HCC) 03/03/2023   Foot infection 11/30/2022   Hyperlipidemia 02/26/2019   Class 2 severe obesity due to excess calories with serious comorbidity and body mass index (BMI) of 35.0 to 35.9 in adult [facility]) 02/26/2019   Type 2 diabetes mellitus without complication, with long-term current use of insulin (HCC) 05/13/2017   Hypokalemia 11/06/2012

## 2023-03-06 NOTE — Discharge Planning (Signed)
LCSW received phone call from Admissions Coordinator Oletha Blend at [facility] regarding patient referral. Per Verlon Au, the patient's A1C is too high and would need to be below 7 in order to be considered for admission. It is also reported that patient has a foot infection and they would need verification that there are no open wounds or current infections. Update was provided to MD and patient.   LCSW followed up with [facility] regarding referral and spoke to [place]. Per Levon Hedger, patient advised to call in to complete phone screening. Patient informed and contact number provided [phone]. Patient reports he will call after lunch today.   LCSW will provide updates as received.   [name], LCSW Clinical Social Worker [facility] Ph: [phone]

## 2023-03-06 NOTE — Discharge Instructions (Addendum)
Please follow-up with a PCP regarding your diabetes management.  [facility] [address], [place], [address] [phone] phone  New Patient Assessment/Therapy Walk-Ins:  Monday and Wednesday: 8 am until slots are full. Every 1st and 2nd Fridays of the month: 1 pm - 5 pm.  NO ASSESSMENT/THERAPY WALK-INS ON TUESDAYS OR THURSDAYS  New Patient Assessment/Medication Management Walk-Ins:  Monday - Friday:  8 am - 11 am.  For all walk-ins, we ask that you arrive by 7:30 am because patients will be seen in the order of arrival.  Availability is limited; therefore, you may not be seen on the same day that you walk-in.  Our goal is to serve and meet the needs of our community to the best of our ability.  SUBSTANCE USE TREATMENT for Medicaid and State Funded/IPRS  [facility] [address], [place], [address] [phone] phone NOTE: [facility] is no longer offering IOP services.  Serves those who are low-income or have no insurance.  [facility] [address], [place], [place], [address] [phone] phone [phone] fax NOTE: Does have Substance Abuse-Intensive Outpatient Program [facility]) as well as transitional housing if eligible.  [facility] [address] [place], [place], [address] [phone] phone [phone] fax  [facility] [phone] [place], [place], [address] [phone] phone [phone] fax  HALFWAY HOUSES:  [facility] [phone]  [employer].[email or web address]  12 STEP PROGRAMS:  Alcoholics Anonymous of [place] [employer].be  Narcotics Anonymous of [place] [employer]dk  Al-Anon of [place], [place] www.[place]alanon.org/find-meetings.html  Nar-Anon https://nar-anon.org/find-a-meetin  List of Residential placements:   [facility] in [place]: [phone]  [facility]: [phone]  [place], [place] [phone]: Male and male facility; 30-day program: (uninsured and Medicaid such as Laurena Bering, [place], [place], partners)  [facility]: [phone]; men and women's facility; 28 days; Can have Medicaid tailored plan Tour manager or Partners)  [facility]: [phone] Karoline Caldwell or Larita Fife; 28 day program; must be fully detox; tailored Medicaid or no insurance  [address] in [place], [place]; [phone]; 28 day all males program; no insurance accepted  [facility] Referral in [place]: Gabriel Rung [phone] (no insurance or Medicaid only); 90 days; outpatient services but provide housing in apartments downtown [place]  [facility] Admission: [phone]: Patient must complete phone screening for placement: [place], [place]; 6 month program; uninsured, Medicaid, and [employer].   [facility]: no insurance required; [phone]  [facility]: [phone]; Intake: Molly Maduro; Must fill out application online; Alecia Lemming Delay [phone] x [address] [facility] in [place], [place]: [phone]; Admissions Coordinators Mr. Maurine Minister or Barron Alvine; 90 day program.  [facility]: [place], [place] [phone]; Co-Ed 9 month to a year program; Online application; Men entry fee is $500 (6-37months);  [employer]: [address] [place], [place] [address]; no fee or insurance required; minimum of 2 years; Highly structured; work based; Intake Coordinator is Thayer Ohm [phone]  [facility] in [place], [place]: [phone]; Fax number is [phone]; website: www.[facility].org; Requires 3-6 page autobiography; 2 year program (18 months and then 61month transitional housing); Admission fee is $300; no insurance needed; work Automotive engineer in [place], [place]: [employer] Desk Staff: Danise Edge [phone]: They have a Men's Regenerations Program 6-23months. Free  program; There is an initial $300 fee however, they are willing to work with patients regarding that. Application is online.  [facility]: Admissions [phone] Doran Heater ext  [phone]; Any 7-90 day program is out of pocket; 12 month program is free of charge; there is a $275 entry fee; Patient is responsible for own transportation

## 2023-03-06 NOTE — ED Notes (Signed)
Patient awake and alert on unit.  He is pleasant and logical.  Patient is without complaint.  F.S.  = 231 5 units novolog given.  No withdrawal at thistime.  Will monitor.

## 2023-03-06 NOTE — Group Note (Signed)
Group Topic: Balance in Life  Group Date: 03/06/2023 Start Time: 1000 End Time: 1135 Facilitators: Vonzell Schlatter B  Department: [facility]  Number of Participants: 6  Group Focus: daily focus and goals/reality orientation Treatment Modality:  Psychoeducation Interventions utilized were reality testing Purpose: reinforce self-care  Name: Dakota Kim Date of Birth: June 07, 1971  MR: [id number]    Level of Participation: active Quality of Participation: attentive and cooperative Interactions with others: gave feedback Mood/Affect: positive Triggers (if applicable): n/a Cognition: coherent/clear Progress: Moderate Response: n/a Plan: follow-up needed  Patients Problems:  Patient Active Problem List   Diagnosis Date Noted   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

## 2023-03-06 NOTE — ED Notes (Signed)
Pt in the bathroom at this hour. No apparent distress. RR even and unlabored. Monitored for safety.  

## 2023-03-07 DIAGNOSIS — E119 Type 2 diabetes mellitus without complications: Secondary | ICD-10-CM | POA: Diagnosis not present

## 2023-03-07 DIAGNOSIS — Z560 Unemployment, unspecified: Secondary | ICD-10-CM | POA: Diagnosis not present

## 2023-03-07 DIAGNOSIS — Z5900 Homelessness unspecified: Secondary | ICD-10-CM | POA: Diagnosis not present

## 2023-03-07 DIAGNOSIS — Z794 Long term (current) use of insulin: Secondary | ICD-10-CM | POA: Diagnosis not present

## 2023-03-07 DIAGNOSIS — F1410 Cocaine abuse, uncomplicated: Secondary | ICD-10-CM | POA: Diagnosis not present

## 2023-03-07 DIAGNOSIS — R45851 Suicidal ideations: Secondary | ICD-10-CM | POA: Diagnosis not present

## 2023-03-07 LAB — GLUCOSE, CAPILLARY
Glucose-Capillary: 250 mg/dL — ABNORMAL HIGH (ref 70–99)
Glucose-Capillary: 269 mg/dL — ABNORMAL HIGH (ref 70–99)
Glucose-Capillary: 256 mg/dL — ABNORMAL HIGH (ref 70–99)
Glucose-Capillary: 237 mg/dL — ABNORMAL HIGH (ref 70–99)
Glucose-Capillary: 270 mg/dL — ABNORMAL HIGH (ref 70–99)

## 2023-03-07 MED ORDER — INSULIN GLARGINE-YFGN 100 UNIT/ML ~~LOC~~ SOLN
30.0000 [IU] | Freq: Every day | SUBCUTANEOUS | Status: DC
  Administered 2023-03-08: 30 [IU] via SUBCUTANEOUS

## 2023-03-07 MED ORDER — INSULIN GLARGINE-YFGN 100 UNIT/ML ~~LOC~~ SOLN
10.0000 [IU] | Freq: Once | SUBCUTANEOUS | Status: AC
  Administered 2023-03-07: 10 [IU] via SUBCUTANEOUS

## 2023-03-07 MED ORDER — TRAZODONE HCL 50 MG PO TABS
50.0000 mg | ORAL_TABLET | Freq: Once | ORAL | Status: AC
  Administered 2023-03-07: 50 mg via ORAL
  Filled 2023-03-07: qty 1

## 2023-03-07 NOTE — ED Notes (Signed)
Pt asleep at this hour. No apparent distress. RR even and unlabored. Monitored for safety.  

## 2023-03-07 NOTE — ED Notes (Signed)
Pt complains of the unit of insulin novolog given to him. He stated he uses 30 units at night at home and now he is on sliding scale,  maximum of 5 units at night which is not helping his sugar level and to make matters worse all the food here are full of carbs and sugar. This is affecting him getting a placement at a facility according to patient. Pt requested to speak with a provider.

## 2023-03-07 NOTE — ED Notes (Signed)
Pt sitting on couch. Complaining of nightmares about cocaine addiction. Pt states that tomorrow will be a week since he has had drugs in his system. "I don't want to lay down right now because of the nightmares." Pt said this is the first night he has had a nightmare about wanting to get high.

## 2023-03-07 NOTE — Progress Notes (Signed)
Pt's CIWA was 0. 

## 2023-03-07 NOTE — Progress Notes (Signed)
Pt is awake, alert and oriented X4. Pt did not voice complain of pain or discomfort. No signs of acute distress noted. Administered scheduled meds with no issue. Pt denies current SI/HI/AVH, plan or intent. Staff will monitor for pt's safety.

## 2023-03-07 NOTE — Group Note (Signed)
Group Topic: Balance in Life  Group Date: 03/07/2023 Start Time: 0800 End Time: 0900 Facilitators: Rae Lips B  Department: [facility]  Number of Participants: 3  Group Focus: acceptance Treatment Modality:  Exposure Therapy Interventions utilized were leisure development Purpose: express feelings  Name: Jari Dipasquale Date of Birth: 08-18-1971  MR: [id number]    Level of Participation: active Quality of Participation: attentive Interactions with others: gave feedback Mood/Affect: appropriate Triggers (if applicable): NA Cognition: coherent/clear Progress: Gaining insight Response: NA Plan: patient will be encouraged to keep going to groups.   Patients Problems:  Patient Active Problem List   Diagnosis Date Noted   Prolonged grief disorder 03/05/2023   Polysubstance abuse (HCC) 03/03/2023   Substance induced mood disorder (HCC) 03/03/2023   Foot infection 11/30/2022   Hyperlipidemia 02/26/2019   Class 2 severe obesity due to excess calories with serious comorbidity and body mass index (BMI) of 35.0 to 35.9 in adult [facility]) 02/26/2019   Type 2 diabetes mellitus without complication, with long-term current use of insulin (HCC) 05/13/2017   Hypokalemia 11/06/2012

## 2023-03-07 NOTE — ED Provider Notes (Signed)
Patient requested to speak with this provider about his blood sugar. Patient says he is concern that his blood sugar continues to be high despite carb modified diet. He is requesting adjustment of his insulin. Patient's current A1C is 10.9.  will increase Semglee from 20units/dat to  30uit/day and order diabetic consult.

## 2023-03-07 NOTE — ED Notes (Signed)
Patient observed/assessed at bedside sitting on his bed. Patient alert and oriented to self and location.  Patient denies pain and anxiety. Pt stated it was hard to him to sleep last night and requested I tell the provider to increase his trazodone 50mg  to 100mg . He denies A/V/H. He denies having any thoughts/plan of self harm and harm towards others. Fluid and snack offered. Patient states that appetite has been good throughout the day. Pt was told not to have more juice to prevent his sugar level going up. Verbalizes no further complaints at this time. Will continue to monitor for safety.

## 2023-03-07 NOTE — Progress Notes (Signed)
Pt has been appropriate and cooperative this shift. Pt was visible in the milieu and had been interacting with peers. Staff will monitor for pt's safety.

## 2023-03-07 NOTE — ED Provider Notes (Signed)
Behavioral Health Progress Note  Date and Time: 03/07/2023 11:06 AM Name: Dakota Kim MRN:  [id number]  Subjective:  Dakota Kim is a 52 year old male with no formal psychiatric diagnoses prior to admission but who meets criteria for cocaine use disorder, nicotine use disorder, and prolonged grief who presented voluntarily to [facility] for detox and substance use treatment, for which he was admitted to [facility].   On assessment today, patient is doing well overall. Last night, he experienced a nightmare around 4 AM about his cocaine addiction. He feels his mind was encouraging him to leave the facility and use cocaine again. He has experienced similar nightmares in the past, but none since admission to [facility]. Patient reports being unable to go back to sleep and has been up since then.  Patient's appetite is intact.  Apart from the nightmare, patient notes he has been doing well in terms of mood. He is no longer ambivalent about leaving [facility] after a patient who closely acquainted with him was discharged. Patient feels group psychoeducation exercises have reinforced his commitment to staying at [facility] and have strengthened his emotional regulation/processing skills. He has regular conversations with his mother, who has also been a core source of encouragement and motivation for the patient.   Patient's blood pressure was elevated to 140/101 this morning and has mildly improved to 141/79 with manual recheck. Patient denies somatic concerns and complaints, including CP, SOB, and headaches. Patient denies SI, HI, AVH.   Diagnosis:  Final diagnoses:  Substance induced mood disorder (HCC)  Polysubstance abuse (HCC)  Prolonged grief disorder    Total Time spent with patient: 30 minutes  Past Psychiatric History: See HPI Past Medical History: See HPI Family History: See HPI Family Psychiatric  History: See HPI Social History: See HPI  Additional Social History:                         Sleep:  Fair  Appetite:  Good  Current Medications:  Current Facility-Administered Medications  Medication Dose Route Frequency Provider Last Rate Last Admin   acetaminophen (TYLENOL) tablet 650 mg  650 mg Oral Q6H PRN Rankin, Shuvon B, NP   650 mg at 03/06/23 2033   alum & mag hydroxide-simeth (MAALOX/MYLANTA) 200-200-20 MG/5ML suspension 30 mL  30 mL Oral Q4H PRN Rankin, Shuvon B, NP       atorvastatin (LIPITOR) tablet 10 mg  10 mg Oral Daily Rankin, Shuvon B, NP   10 mg at 03/07/23 0836   gabapentin (NEURONTIN) capsule 300 mg  300 mg Oral BID Rankin, Shuvon B, NP   300 mg at 03/07/23 0836   glipiZIDE (GLUCOTROL) tablet 5 mg  5 mg Oral QAC breakfast Rankin, Shuvon B, NP   5 mg at 03/07/23 0836   insulin aspart (novoLOG) injection 0-15 Units  0-15 Units Subcutaneous TID WC Rankin, Shuvon B, NP   8 Units at 03/07/23 0834   insulin aspart (novoLOG) injection 0-5 Units  0-5 Units Subcutaneous QHS Rankin, Shuvon B, NP   3 Units at 03/06/23 2113   insulin glargine-yfgn (SEMGLEE) injection 20 Units  20 Units Subcutaneous Daily Rankin, Shuvon B, NP   20 Units at 03/07/23 0835   nicotine (NICODERM CQ - dosed in mg/24 hours) patch 14 mg  14 mg Transdermal Daily PRN Lamar Sprinkles, MD       polyethylene glycol (MIRALAX / GLYCOLAX) packet 17 g  17 g Oral Daily PRN Ajibola, Ene A, NP   17 g at  03/03/23 2138   traZODone (DESYREL) tablet 50 mg  50 mg Oral QHS PRN Rankin, Shuvon B, NP   50 mg at 03/06/23 2114   Current Outpatient Medications  Medication Sig Dispense Refill   atorvastatin (LIPITOR) 10 MG tablet TAKE 1 TABLET (10 MG TOTAL) BY MOUTH DAILY. (Patient not taking: Reported on 03/02/2023) 30 tablet 11   gabapentin (NEURONTIN) 300 MG capsule Take 1 capsule (300 mg total) by mouth 2 (two) times daily. (Patient not taking: Reported on 03/02/2023) 60 capsule 2   glipiZIDE (GLUCOTROL) 5 MG tablet Take 1 tablet (5 mg total) by mouth daily before breakfast. (Patient not taking: Reported on 03/02/2023) 60 tablet 0    hydrOXYzine (ATARAX) 25 MG tablet Take 1 tablet (25 mg total) by mouth 3 (three) times daily as needed for anxiety. 30 tablet 0   insulin aspart (NOVOLOG) 100 UNIT/ML injection Inject 10 Units into the skin 3 (three) times daily with meals. 10 mL 11   insulin aspart (NOVOLOG) 100 UNIT/ML injection Inject 0-15 Units into the skin 3 (three) times daily with meals. 10 mL 11   insulin aspart (NOVOLOG) 100 UNIT/ML injection Inject 0-5 Units into the skin at bedtime. 10 mL 11   Insulin Glargine (BASAGLAR KWIKPEN) 100 UNIT/ML Inject 15 Units into the skin at bedtime. (Patient taking differently: Inject 20 Units into the skin at bedtime.) 15 mL 1   insulin glargine-yfgn (SEMGLEE) 100 UNIT/ML injection Inject 0.2 mLs (20 Units total) into the skin daily. 10 mL 11   traZODone (DESYREL) 50 MG tablet Take 1 tablet (50 mg total) by mouth at bedtime as needed for sleep.      Labs  Lab Results:  Admission on 03/03/2023  Component Date Value Ref Range Status   Glucose-Capillary 03/03/2023 287 (H)  70 - 99 mg/dL Final   Glucose reference range applies only to samples taken after fasting for at least 8 hours.   Glucose-Capillary 03/04/2023 252 (H)  70 - 99 mg/dL Final   Glucose reference range applies only to samples taken after fasting for at least 8 hours.   Glucose-Capillary 03/04/2023 341 (H)  70 - 99 mg/dL Final   Glucose reference range applies only to samples taken after fasting for at least 8 hours.   Glucose-Capillary 03/04/2023 286 (H)  70 - 99 mg/dL Final   Glucose reference range applies only to samples taken after fasting for at least 8 hours.   Glucose-Capillary 03/03/2023 175 (H)  70 - 99 mg/dL Final   Glucose reference range applies only to samples taken after fasting for at least 8 hours.   Glucose-Capillary 03/04/2023 182 (H)  70 - 99 mg/dL Final   Glucose reference range applies only to samples taken after fasting for at least 8 hours.   Glucose-Capillary 03/05/2023 254 (H)  70 - 99  mg/dL Final   Glucose reference range applies only to samples taken after fasting for at least 8 hours.   Glucose-Capillary 03/05/2023 163 (H)  70 - 99 mg/dL Final   Glucose reference range applies only to samples taken after fasting for at least 8 hours.   Glucose-Capillary 03/05/2023 242 (H)  70 - 99 mg/dL Final   Glucose reference range applies only to samples taken after fasting for at least 8 hours.   Glucose-Capillary 03/05/2023 387 (H)  70 - 99 mg/dL Final   Glucose reference range applies only to samples taken after fasting for at least 8 hours.   Glucose-Capillary 03/06/2023 232 (H)  70 -  99 mg/dL Final   Glucose reference range applies only to samples taken after fasting for at least 8 hours.   Glucose-Capillary 03/06/2023 231 (H)  70 - 99 mg/dL Final   Glucose reference range applies only to samples taken after fasting for at least 8 hours.   Glucose-Capillary 03/06/2023 296 (H)  70 - 99 mg/dL Final   Glucose reference range applies only to samples taken after fasting for at least 8 hours.   Glucose-Capillary 03/06/2023 257 (H)  70 - 99 mg/dL Final   Glucose reference range applies only to samples taken after fasting for at least 8 hours.   Glucose-Capillary 03/07/2023 269 (H)  70 - 99 mg/dL Final   Glucose reference range applies only to samples taken after fasting for at least 8 hours.   Glucose-Capillary 03/06/2023 250 (H)  70 - 99 mg/dL Final   Glucose reference range applies only to samples taken after fasting for at least 8 hours.  Admission on 03/01/2023, Discharged on 03/03/2023  Component Date Value Ref Range Status   WBC 03/02/2023 5.7  4.0 - 10.5 K/uL Final   RBC 03/02/2023 4.71  4.22 - 5.81 MIL/uL Final   Hemoglobin 03/02/2023 13.7  13.0 - 17.0 g/dL Final   HCT 82/95/6213 43.7  39.0 - 52.0 % Final   MCV 03/02/2023 92.8  80.0 - 100.0 fL Final   MCH 03/02/2023 29.1  26.0 - 34.0 pg Final   MCHC 03/02/2023 31.4  30.0 - 36.0 g/dL Final   RDW 08/65/7846 14.1  11.5 - 15.5  % Final   Platelets 03/02/2023 256  150 - 400 K/uL Final   nRBC 03/02/2023 0.0  0.0 - 0.2 % Final   Neutrophils Relative % 03/02/2023 53  % Final   Neutro Abs 03/02/2023 3.1  1.7 - 7.7 K/uL Final   Lymphocytes Relative 03/02/2023 32  % Final   Lymphs Abs 03/02/2023 1.8  0.7 - 4.0 K/uL Final   Monocytes Relative 03/02/2023 11  % Final   Monocytes Absolute 03/02/2023 0.6  0.1 - 1.0 K/uL Final   Eosinophils Relative 03/02/2023 2  % Final   Eosinophils Absolute 03/02/2023 0.1  0.0 - 0.5 K/uL Final   Basophils Relative 03/02/2023 1  % Final   Basophils Absolute 03/02/2023 0.0  0.0 - 0.1 K/uL Final   Immature Granulocytes 03/02/2023 1  % Final   Abs Immature Granulocytes 03/02/2023 0.04  0.00 - 0.07 K/uL Final   Performed at [facility], [address], [place], [place] [address]   Sodium 03/02/2023 137  135 - 145 mmol/L Final   Potassium 03/02/2023 5.0  3.5 - 5.1 mmol/L Final   Chloride 03/02/2023 100  98 - 111 mmol/L Final   CO2 03/02/2023 27  22 - 32 mmol/L Final   Glucose, Bld 03/02/2023 424 (H)  70 - 99 mg/dL Final   Glucose reference range applies only to samples taken after fasting for at least 8 hours.   BUN 03/02/2023 14  6 - 20 mg/dL Final   Creatinine, Ser 03/02/2023 0.90  0.61 - 1.24 mg/dL Final   Calcium 28/41/3244 9.3  8.9 - 10.3 mg/dL Final   Total Protein 09/13/7251 6.5  6.5 - 8.1 g/dL Final   Albumin 66/44/0347 3.6  3.5 - 5.0 g/dL Final   AST 42/59/5638 13 (L)  15 - 41 U/L Final   ALT 03/02/2023 16  0 - 44 U/L Final   Alkaline Phosphatase 03/02/2023 65  38 - 126 U/L Final  Total Bilirubin 03/02/2023 0.5  0.3 - 1.2 mg/dL Final   GFR, Estimated 03/02/2023 >60  >60 mL/min Final   Comment: (NOTE) Calculated using the CKD-EPI Creatinine Equation (2021)    Anion gap 03/02/2023 10  5 - 15 Final   Performed at [facility], [address] [place], [place] [address]   Hgb A1c MFr Bld 03/02/2023 10.9 (H)  4.8 - 5.6 % Final   Comment: (NOTE) Pre diabetes:           5.7%-6.4%  Diabetes:              >6.4%  Glycemic control for   <7.0% adults with diabetes    Mean Plasma Glucose 03/02/2023 266.13  mg/dL Final   Performed at [facility], [address] [place], [place] [address]   Alcohol, Ethyl (B) 03/02/2023 <10  <10 mg/dL Final   Comment: (NOTE) Lowest detectable limit for serum alcohol is 10 mg/dL.  For medical purposes only. Performed at [facility], [address], [place], [place] [address]    TSH 03/02/2023 1.178  0.350 - 4.500 uIU/mL Final   Comment: Performed by a 3rd Generation assay with a functional sensitivity of <=0.01 uIU/mL. Performed at [facility], [address] [place], [place] [address]    POC Amphetamine UR 03/02/2023 None Detected  NONE DETECTED (Cut Off Level 1000 ng/mL) Final   POC Secobarbital (BAR) 03/02/2023 None Detected  NONE DETECTED (Cut Off Level 300 ng/mL) Final   POC Buprenorphine (BUP) 03/02/2023 None Detected  NONE DETECTED (Cut Off Level 10 ng/mL) Final   POC Oxazepam (BZO) 03/02/2023 None Detected  NONE DETECTED (Cut Off Level 300 ng/mL) Final   POC Cocaine UR 03/02/2023 Positive (A)  NONE DETECTED (Cut Off Level 300 ng/mL) Final   POC Methamphetamine UR 03/02/2023 None Detected  NONE DETECTED (Cut Off Level 1000 ng/mL) Final   POC Morphine 03/02/2023 None Detected  NONE DETECTED (Cut Off Level 300 ng/mL) Final   POC Methadone UR 03/02/2023 None Detected  NONE DETECTED (Cut Off Level 300 ng/mL) Final   POC Oxycodone UR 03/02/2023 None Detected  NONE DETECTED (Cut Off Level 100 ng/mL) Final   POC Marijuana UR 03/02/2023 Positive (A)  NONE DETECTED (Cut Off Level 50 ng/mL) Final   Glucose-Capillary 03/02/2023 499 (H)  70 - 99 mg/dL Final   Glucose reference range applies only to samples taken after fasting for at least 8 hours.   Glucose-Capillary 03/02/2023 404 (H)  70 - 99 mg/dL Final   Glucose reference range applies only to samples taken after fasting for at least 8 hours.    Glucose-Capillary 03/02/2023 214 (H)  70 - 99 mg/dL Final   Glucose reference range applies only to samples taken after fasting for at least 8 hours.   Glucose-Capillary 03/02/2023 265 (H)  70 - 99 mg/dL Final   Glucose reference range applies only to samples taken after fasting for at least 8 hours.   Glucose-Capillary 03/03/2023 263 (H)  70 - 99 mg/dL Final   Glucose reference range applies only to samples taken after fasting for at least 8 hours.   Glucose-Capillary 03/03/2023 258 (H)  70 - 99 mg/dL Final   Glucose reference range applies only to samples taken after fasting for at least 8 hours.  Admission on 02/05/2023, Discharged on 02/05/2023  Component Date Value Ref Range Status   Glucose-Capillary 02/05/2023 380 (H)  70 - 99 mg/dL Final   Glucose reference  range applies only to samples taken after fasting for at least 8 hours.   Sodium 02/05/2023 138  135 - 145 mmol/L Final   Potassium 02/05/2023 4.0  3.5 - 5.1 mmol/L Final   Chloride 02/05/2023 103  98 - 111 mmol/L Final   CO2 02/05/2023 26  22 - 32 mmol/L Final   Glucose, Bld 02/05/2023 380 (H)  70 - 99 mg/dL Final   Glucose reference range applies only to samples taken after fasting for at least 8 hours.   BUN 02/05/2023 11  6 - 20 mg/dL Final   Creatinine, Ser 02/05/2023 0.92  0.61 - 1.24 mg/dL Final   Calcium 16/06/9603 9.1  8.9 - 10.3 mg/dL Final   GFR, Estimated 02/05/2023 >60  >60 mL/min Final   Comment: (NOTE) Calculated using the CKD-EPI Creatinine Equation (2021)    Anion gap 02/05/2023 9  5 - 15 Final   Performed at [facility], [address], [place], [place] [address]   WBC 02/05/2023 7.2  4.0 - 10.5 K/uL Final   RBC 02/05/2023 5.40  4.22 - 5.81 MIL/uL Final   Hemoglobin 02/05/2023 16.0  13.0 - 17.0 g/dL Final   HCT 11/91/4782 49.7  39.0 - 52.0 % Final   MCV 02/05/2023 92.0  80.0 - 100.0 fL Final   MCH 02/05/2023 29.6  26.0 - 34.0 pg Final   MCHC 02/05/2023 32.2  30.0 - 36.0 g/dL Final   RDW 95/62/1308  13.9  11.5 - 15.5 % Final   Platelets 02/05/2023 266  150 - 400 K/uL Final   nRBC 02/05/2023 0.0  0.0 - 0.2 % Final   Performed at [facility], [address], [place], [place] [address]   Glucose-Capillary 02/05/2023 342 (H)  70 - 99 mg/dL Final   Glucose reference range applies only to samples taken after fasting for at least 8 hours.   Total Protein 02/05/2023 7.5  6.5 - 8.1 g/dL Final   Albumin 69/62/9528 4.1  3.5 - 5.0 g/dL Final   AST 41/32/4401 16  15 - 41 U/L Final   ALT 02/05/2023 12  0 - 44 U/L Final   Alkaline Phosphatase 02/05/2023 83  38 - 126 U/L Final   Total Bilirubin 02/05/2023 1.2  0.3 - 1.2 mg/dL Final   Bilirubin, Direct 02/05/2023 0.3 (H)  0.0 - 0.2 mg/dL Final   Indirect Bilirubin 02/05/2023 0.9  0.3 - 0.9 mg/dL Final   Performed at [facility], [address] [place], [place] [address]   Lipase 02/05/2023 36  11 - 51 U/L Final   Performed at [facility], [address] [place], [place] [address]   pH, Ven 02/05/2023 7.352  7.25 - 7.43 Final   pCO2, Ven 02/05/2023 48.6  44 - 60 mmHg Final   pO2, Ven 02/05/2023 28 (LL)  32 - 45 mmHg Final   Bicarbonate 02/05/2023 26.9  20.0 - 28.0 mmol/L Final   TCO2 02/05/2023 28  22 - 32 mmol/L Final   O2 Saturation 02/05/2023 49  % Final   Acid-Base Excess 02/05/2023 0.0  0.0 - 2.0 mmol/L Final   Sodium 02/05/2023 140  135 - 145 mmol/L Final   Potassium 02/05/2023 3.9  3.5 - 5.1 mmol/L Final   Calcium, Ion 02/05/2023 1.22  1.15 - 1.40 mmol/L Final   HCT 02/05/2023 50.0  39.0 - 52.0 % Final   Hemoglobin 02/05/2023 17.0  13.0 - 17.0 g/dL Final   Sample type 03/47/4259 VENOUS  Final   Comment 02/05/2023 NOTIFIED PHYSICIAN   Final   Beta-Hydroxybutyric Acid 02/05/2023 0.21  0.05 - 0.27 mmol/L Final   Performed at [facility], [address] [place], [place] [address]   Glucose-Capillary 02/05/2023 213 (H)  70 - 99 mg/dL Final   Glucose reference range applies only to samples taken after fasting for  at least 8 hours.  Admission on 01/27/2023, Discharged on 01/27/2023  Component Date Value Ref Range Status   Glucose-Capillary 01/27/2023 454 (H)  70 - 99 mg/dL Final   Glucose reference range applies only to samples taken after fasting for at least 8 hours.   WBC 01/27/2023 5.3  4.0 - 10.5 K/uL Final   RBC 01/27/2023 4.77  4.22 - 5.81 MIL/uL Final   Hemoglobin 01/27/2023 14.2  13.0 - 17.0 g/dL Final   HCT 62/13/0865 43.5  39.0 - 52.0 % Final   MCV 01/27/2023 91.2  80.0 - 100.0 fL Final   MCH 01/27/2023 29.8  26.0 - 34.0 pg Final   MCHC 01/27/2023 32.6  30.0 - 36.0 g/dL Final   RDW 78/46/9629 13.9  11.5 - 15.5 % Final   Platelets 01/27/2023 238  150 - 400 K/uL Final   nRBC 01/27/2023 0.0  0.0 - 0.2 % Final   Neutrophils Relative % 01/27/2023 68  % Final   Neutro Abs 01/27/2023 3.5  1.7 - 7.7 K/uL Final   Lymphocytes Relative 01/27/2023 23  % Final   Lymphs Abs 01/27/2023 1.2  0.7 - 4.0 K/uL Final   Monocytes Relative 01/27/2023 9  % Final   Monocytes Absolute 01/27/2023 0.5  0.1 - 1.0 K/uL Final   Eosinophils Relative 01/27/2023 0  % Final   Eosinophils Absolute 01/27/2023 0.0  0.0 - 0.5 K/uL Final   Basophils Relative 01/27/2023 0  % Final   Basophils Absolute 01/27/2023 0.0  0.0 - 0.1 K/uL Final   Immature Granulocytes 01/27/2023 0  % Final   Abs Immature Granulocytes 01/27/2023 0.02  0.00 - 0.07 K/uL Final   Performed at [facility], [address], [place], [place] [address]   Sodium 01/27/2023 133 (L)  135 - 145 mmol/L Final   Potassium 01/27/2023 4.5  3.5 - 5.1 mmol/L Final   Chloride 01/27/2023 99  98 - 111 mmol/L Final   CO2 01/27/2023 25  22 - 32 mmol/L Final   Glucose, Bld 01/27/2023 410 (H)  70 - 99 mg/dL Final   Glucose reference range applies only to samples taken after fasting for at least 8 hours.   BUN 01/27/2023 17  6 - 20 mg/dL Final   Creatinine, Ser 01/27/2023 0.89  0.61 - 1.24 mg/dL Final   Calcium 32/44/0102 8.5 (L)  8.9 - 10.3 mg/dL  Final   Total Protein 01/27/2023 6.8  6.5 - 8.1 g/dL Final   Albumin 72/53/6644 3.6  3.5 - 5.0 g/dL Final   AST 03/47/4259 19  15 - 41 U/L Final   ALT 01/27/2023 15  0 - 44 U/L Final   Alkaline Phosphatase 01/27/2023 60  38 - 126 U/L Final   Total Bilirubin 01/27/2023 0.7  0.3 - 1.2 mg/dL Final   GFR, Estimated 01/27/2023 >60  >60 mL/min Final   Comment: (NOTE) Calculated using the CKD-EPI Creatinine Equation (2021)    Anion gap 01/27/2023 9  5 - 15 Final   Performed at [facility], [address], [place], [place] [address]   Glucose-Capillary 01/27/2023 339 (H)  70 -  99 mg/dL Final   Glucose reference range applies only to samples taken after fasting for at least 8 hours.  Admission on 12/23/2022, Discharged on 12/23/2022  Component Date Value Ref Range Status   Glucose-Capillary 12/23/2022 339 (H)  70 - 99 mg/dL Final   Glucose reference range applies only to samples taken after fasting for at least 8 hours.   Comment 1 12/23/2022 Notify RN   Final   Sodium 12/23/2022 132 (L)  135 - 145 mmol/L Final   Potassium 12/23/2022 4.0  3.5 - 5.1 mmol/L Final   Chloride 12/23/2022 102  98 - 111 mmol/L Final   CO2 12/23/2022 26  22 - 32 mmol/L Final   Glucose, Bld 12/23/2022 351 (H)  70 - 99 mg/dL Final   Glucose reference range applies only to samples taken after fasting for at least 8 hours.   BUN 12/23/2022 13  6 - 20 mg/dL Final   Creatinine, Ser 12/23/2022 0.70  0.61 - 1.24 mg/dL Final   Calcium 41/32/4401 8.3 (L)  8.9 - 10.3 mg/dL Final   GFR, Estimated 12/23/2022 >60  >60 mL/min Final   Comment: (NOTE) Calculated using the CKD-EPI Creatinine Equation (2021)    Anion gap 12/23/2022 4 (L)  5 - 15 Final   Performed at [facility], [address] [place], [place] [address]   WBC 12/23/2022 6.9  4.0 - 10.5 K/uL Final   RBC 12/23/2022 4.60  4.22 - 5.81 MIL/uL Final   Hemoglobin 12/23/2022 13.6  13.0 - 17.0 g/dL Final   HCT 36/64/4034 42.7  39.0  - 52.0 % Final   MCV 12/23/2022 92.8  80.0 - 100.0 fL Final   MCH 12/23/2022 29.6  26.0 - 34.0 pg Final   MCHC 12/23/2022 31.9  30.0 - 36.0 g/dL Final   RDW 74/25/9563 13.4  11.5 - 15.5 % Final   Platelets 12/23/2022 268  150 - 400 K/uL Final   nRBC 12/23/2022 0.0  0.0 - 0.2 % Final   Performed at [facility], [address], [place], [place] [address]   Color, Urine 12/23/2022 YELLOW  YELLOW Final   APPearance 12/23/2022 CLEAR  CLEAR Final   Specific Gravity, Urine 12/23/2022 1.026  1.005 - 1.030 Final   pH 12/23/2022 7.0  5.0 - 8.0 Final   Glucose, UA 12/23/2022 >=500 (A)  NEGATIVE mg/dL Final   Hgb urine dipstick 12/23/2022 NEGATIVE  NEGATIVE Final   Bilirubin Urine 12/23/2022 NEGATIVE  NEGATIVE Final   Ketones, ur 12/23/2022 NEGATIVE  NEGATIVE mg/dL Final   Protein, ur 33/29/5188 NEGATIVE  NEGATIVE mg/dL Final   Nitrite 41/66/0630 NEGATIVE  NEGATIVE Final   Leukocytes,Ua 12/23/2022 NEGATIVE  NEGATIVE Final   RBC / HPF 12/23/2022 0-5  0 - 5 RBC/hpf Final   WBC, UA 12/23/2022 0-5  0 - 5 WBC/hpf Final   Bacteria, UA 12/23/2022 NONE SEEN  NONE SEEN Final   Squamous Epithelial / HPF 12/23/2022 0-5  0 - 5 /HPF Final   Performed at [facility], [address], [place], [place] [address]   Glucose-Capillary 12/23/2022 295 (H)  70 - 99 mg/dL Final   Glucose reference range applies only to samples taken after fasting for at least 8 hours.   Glucose-Capillary 12/23/2022 231 (H)  70 - 99 mg/dL Final   Glucose reference range applies only to samples taken after fasting for at least 8 hours.  Office Visit on 11/30/2022  Component Date Value Ref Range Status  Creatinine, Urine 11/30/2022 108.2  Not Estab. mg/dL Final   Microalbumin, Urine 11/30/2022 12.1  Not Estab. ug/mL Final   Microalb/Creat Ratio 11/30/2022 11  0 - 29 mg/g creat Final   Comment:                        Normal:                0 -  29                        Moderately increased: 30 -  300                        Severely increased:       >300    Glucose 11/30/2022 49 (L)  70 - 99 mg/dL Final   BUN 36/64/4034 11  6 - 24 mg/dL Final   Creatinine, Ser 11/30/2022 0.77  0.76 - 1.27 mg/dL Final   eGFR 74/25/9563 108  >59 mL/min/1.73 Final   BUN/Creatinine Ratio 11/30/2022 14  9 - 20 Final   Sodium 11/30/2022 144  134 - 144 mmol/L Final   Potassium 11/30/2022 5.2  3.5 - 5.2 mmol/L Final   Chloride 11/30/2022 104  96 - 106 mmol/L Final   CO2 11/30/2022 26  20 - 29 mmol/L Final   Calcium 11/30/2022 9.3  8.7 - 10.2 mg/dL Final   Total Protein 87/56/4332 7.0  6.0 - 8.5 g/dL Final   Albumin 95/18/8416 4.2  3.8 - 4.9 g/dL Final   Globulin, Total 11/30/2022 2.8  1.5 - 4.5 g/dL Final   Albumin/Globulin Ratio 11/30/2022 1.5  1.2 - 2.2 Final   Bilirubin Total 11/30/2022 0.2  0.0 - 1.2 mg/dL Final   Alkaline Phosphatase 11/30/2022 66  44 - 121 IU/L Final   AST 11/30/2022 16  0 - 40 IU/L Final   ALT 11/30/2022 13  0 - 44 IU/L Final   Hemoglobin A1C 11/30/2022 9.0 (A)  4.0 - 5.6 % Final   POC Glucose 11/30/2022 60 (A)  70 - 99 mg/dl Final   WBC 60/63/0160 7.7  3.4 - 10.8 x10E3/uL Final   RBC 11/30/2022 4.94  4.14 - 5.80 x10E6/uL Final   Hemoglobin 11/30/2022 14.6  13.0 - 17.7 g/dL Final   Hematocrit 10/93/2355 45.2  37.5 - 51.0 % Final   MCV 11/30/2022 92  79 - 97 fL Final   MCH 11/30/2022 29.6  26.6 - 33.0 pg Final   MCHC 11/30/2022 32.3  31.5 - 35.7 g/dL Final   RDW 73/22/0254 12.7  11.6 - 15.4 % Final   Platelets 11/30/2022 269  150 - 450 x10E3/uL Final   Neutrophils 11/30/2022 61  Not Estab. % Final   Lymphs 11/30/2022 28  Not Estab. % Final   Monocytes 11/30/2022 10  Not Estab. % Final   Eos 11/30/2022 1  Not Estab. % Final   Basos 11/30/2022 0  Not Estab. % Final   Neutrophils Absolute 11/30/2022 4.6  1.4 - 7.0 x10E3/uL Final   Lymphocytes Absolute 11/30/2022 2.2  0.7 - 3.1 x10E3/uL Final   Monocytes Absolute 11/30/2022 0.8  0.1 - 0.9 x10E3/uL Final   EOS (ABSOLUTE)  11/30/2022 0.1  0.0 - 0.4 x10E3/uL Final   Basophils Absolute 11/30/2022 0.0  0.0 - 0.2 x10E3/uL Final   Immature Granulocytes 11/30/2022 0  Not Estab. % Final   Immature Grans (Abs) 11/30/2022 0.0  0.0 -  0.1 x10E3/uL Final   Cholesterol, Total 11/30/2022 112  100 - 199 mg/dL Final   Triglycerides 16/06/9603 82  0 - 149 mg/dL Final   HDL 54/05/8118 52  >39 mg/dL Final   VLDL Cholesterol Cal 11/30/2022 16  5 - 40 mg/dL Final   LDL Chol Calc (NIH) 11/30/2022 44  0 - 99 mg/dL Final   Chol/HDL Ratio 11/30/2022 2.2  0.0 - 5.0 ratio Final   Comment:                                   T. Chol/HDL Ratio                                             Men  Women                               1/2 Avg.Risk  3.4    3.3                                   Avg.Risk  5.0    4.4                                2X Avg.Risk  9.6    7.1                                3X Avg.Risk 23.4   11.0   Admission on 10/28/2022, Discharged on 10/28/2022  Component Date Value Ref Range Status   WBC 10/28/2022 5.9  4.0 - 10.5 K/uL Final   RBC 10/28/2022 4.74  4.22 - 5.81 MIL/uL Final   Hemoglobin 10/28/2022 14.2  13.0 - 17.0 g/dL Final   HCT 14/78/2956 44.3  39.0 - 52.0 % Final   MCV 10/28/2022 93.5  80.0 - 100.0 fL Final   MCH 10/28/2022 30.0  26.0 - 34.0 pg Final   MCHC 10/28/2022 32.1  30.0 - 36.0 g/dL Final   RDW 21/30/8657 13.9  11.5 - 15.5 % Final   Platelets 10/28/2022 244  150 - 400 K/uL Final   nRBC 10/28/2022 0.0  0.0 - 0.2 % Final   Neutrophils Relative % 10/28/2022 72  % Final   Neutro Abs 10/28/2022 4.3  1.7 - 7.7 K/uL Final   Lymphocytes Relative 10/28/2022 19  % Final   Lymphs Abs 10/28/2022 1.1  0.7 - 4.0 K/uL Final   Monocytes Relative 10/28/2022 8  % Final   Monocytes Absolute 10/28/2022 0.5  0.1 - 1.0 K/uL Final   Eosinophils Relative 10/28/2022 1  % Final   Eosinophils Absolute 10/28/2022 0.0  0.0 - 0.5 K/uL Final   Basophils Relative 10/28/2022 0  % Final   Basophils Absolute 10/28/2022 0.0  0.0 -  0.1 K/uL Final   Immature Granulocytes 10/28/2022 0  % Final   Abs Immature Granulocytes 10/28/2022 0.01  0.00 - 0.07 K/uL Final   Performed at [facility], [address] [place], [place] [address]   Lipase 10/28/2022 137 (H)  11 - 51 U/L Final   Performed  at [facility], [address], [place], [place] [address]   Opiates 10/28/2022 NONE DETECTED  NONE DETECTED Final   Cocaine 10/28/2022 POSITIVE (A)  NONE DETECTED Final   Benzodiazepines 10/28/2022 NONE DETECTED  NONE DETECTED Final   Amphetamines 10/28/2022 NONE DETECTED  NONE DETECTED Final   Tetrahydrocannabinol 10/28/2022 NONE DETECTED  NONE DETECTED Final   Barbiturates 10/28/2022 NONE DETECTED  NONE DETECTED Final   Comment: (NOTE) DRUG SCREEN FOR MEDICAL PURPOSES ONLY.  IF CONFIRMATION IS NEEDED FOR ANY PURPOSE, NOTIFY LAB WITHIN 5 DAYS.  LOWEST DETECTABLE LIMITS FOR URINE DRUG SCREEN Drug Class                     Cutoff (ng/mL) Amphetamine and metabolites    1000 Barbiturate and metabolites    200 Benzodiazepine                 200 Opiates and metabolites        300 Cocaine and metabolites        300 THC                            50 Performed at [facility], [address] [place], [place] [address]    Alcohol, Ethyl (B) 10/28/2022 <10  <10 mg/dL Final   Comment: (NOTE) Lowest detectable limit for serum alcohol is 10 mg/dL.  For medical purposes only. Performed at [facility], [address], [place], [place] [address]    Sodium 10/28/2022 135  135 - 145 mmol/L Final   Potassium 10/28/2022 4.0  3.5 - 5.1 mmol/L Final   Chloride 10/28/2022 99  98 - 111 mmol/L Final   CO2 10/28/2022 23  22 - 32 mmol/L Final   Glucose, Bld 10/28/2022 273 (H)  70 - 99 mg/dL Final   Glucose reference range applies only to samples taken after fasting for at least 8 hours.   BUN 10/28/2022 7  6 - 20 mg/dL Final   Creatinine, Ser 10/28/2022 0.69  0.61 - 1.24 mg/dL Final   Calcium 44/09/270 9.0  8.9 -  10.3 mg/dL Final   Total Protein 53/66/4403 7.2  6.5 - 8.1 g/dL Final   Albumin 47/42/5956 3.8  3.5 - 5.0 g/dL Final   AST 38/75/6433 22  15 - 41 U/L Final   ALT 10/28/2022 17  0 - 44 U/L Final   Alkaline Phosphatase 10/28/2022 65  38 - 126 U/L Final   Total Bilirubin 10/28/2022 0.3  0.3 - 1.2 mg/dL Final   GFR, Estimated 10/28/2022 >60  >60 mL/min Final   Comment: (NOTE) Calculated using the CKD-EPI Creatinine Equation (2021)    Anion gap 10/28/2022 13  5 - 15 Final   Performed at [facility], [address] [place], [place] [address]   Salicylate Lvl 10/28/2022 <7.0 (L)  7.0 - 30.0 mg/dL Final   Performed at [facility], [address], [place], [place] [address]   Acetaminophen (Tylenol), Serum 10/28/2022 <10 (L)  10 - 30 ug/mL Final   Comment: (NOTE) Therapeutic concentrations vary significantly. A range of 10-30 ug/mL  may be an effective concentration for many patients. However, some  are best treated at concentrations outside of this range. Acetaminophen concentrations >150 ug/mL at 4 hours after ingestion  and >50 ug/mL at 12 hours after ingestion are often associated with  toxic reactions.  Performed at [facility], [address] [place], [place] [address]   Admission on 10/10/2022, Discharged on 10/10/2022  Component Date Value Ref Range Status   Glucose-Capillary 10/10/2022 >600 (HH)  70 - 99 mg/dL Final   Glucose reference range applies only to samples taken after fasting for at least 8 hours.   Sodium 10/10/2022 129 (L)  135 - 145 mmol/L Final   Potassium 10/10/2022 4.4  3.5 - 5.1 mmol/L Final   Chloride 10/10/2022 95 (L)  98 - 111 mmol/L Final   CO2 10/10/2022 24  22 - 32 mmol/L Final   Glucose, Bld 10/10/2022 683 (HH)  70 - 99 mg/dL Final   Comment: CRITICAL RESULT CALLED TO, READ BACK BY AND VERIFIED WITH LAMB,S. RN AT 1219 10/10/22 MULLINS,T Glucose reference range applies only to samples taken after fasting for at least 8 hours.    BUN  10/10/2022 16  6 - 20 mg/dL Final   Creatinine, Ser 10/10/2022 0.93  0.61 - 1.24 mg/dL Final   Calcium 74/25/9563 8.4 (L)  8.9 - 10.3 mg/dL Final   Total Protein 87/56/4332 6.9  6.5 - 8.1 g/dL Final   Albumin 95/18/8416 3.6  3.5 - 5.0 g/dL Final   AST 60/63/0160 16  15 - 41 U/L Final   ALT 10/10/2022 11  0 - 44 U/L Final   Alkaline Phosphatase 10/10/2022 61  38 - 126 U/L Final   Total Bilirubin 10/10/2022 0.7  0.3 - 1.2 mg/dL Final   GFR, Estimated 10/10/2022 >60  >60 mL/min Final   Comment: (NOTE) Calculated using the CKD-EPI Creatinine Equation (2021)    Anion gap 10/10/2022 10  5 - 15 Final   Performed at [facility], [address] [place], [place] [address]   Color, Urine 10/10/2022 STRAW (A)  YELLOW Final   APPearance 10/10/2022 CLEAR  CLEAR Final   Specific Gravity, Urine 10/10/2022 1.031 (H)  1.005 - 1.030 Final   pH 10/10/2022 5.0  5.0 - 8.0 Final   Glucose, UA 10/10/2022 >=500 (A)  NEGATIVE mg/dL Final   Hgb urine dipstick 10/10/2022 NEGATIVE  NEGATIVE Final   Bilirubin Urine 10/10/2022 NEGATIVE  NEGATIVE Final   Ketones, ur 10/10/2022 NEGATIVE  NEGATIVE mg/dL Final   Protein, ur 35/57/3220 NEGATIVE  NEGATIVE mg/dL Final   Nitrite 25/42/7062 NEGATIVE  NEGATIVE Final   Leukocytes,Ua 10/10/2022 NEGATIVE  NEGATIVE Final   RBC / HPF 10/10/2022 0-5  0 - 5 RBC/hpf Final   WBC, UA 10/10/2022 0-5  0 - 5 WBC/hpf Final   Bacteria, UA 10/10/2022 NONE SEEN  NONE SEEN Final   Squamous Epithelial / HPF 10/10/2022 0-5  0 - 5 /HPF Final   Performed at [facility], [address] [place], [place] [address]   Beta-Hydroxybutyric Acid 10/10/2022 0.41 (H)  0.05 - 0.27 mmol/L Final   Performed at [facility], [address] [place], [place] [address]   pH, Ven 10/10/2022 7.39  7.25 - 7.43 Final   pCO2, Ven 10/10/2022 42 (L)  44 - 60 mmHg Final   pO2, Ven 10/10/2022 59 (H)  32 - 45 mmHg Final   Bicarbonate 10/10/2022 25.4  20.0 -  28.0 mmol/L Final   Acid-Base Excess 10/10/2022 0.3  0.0 - 2.0 mmol/L Final   O2 Saturation 10/10/2022 92.1  % Final   Patient temperature 10/10/2022 37.0   Final   Performed at [facility], [address], [place], [place] [address]   Osmolality 10/10/2022 315 (H)  275 - 295 mOsm/kg Final   Comment: REPEATED TO VERIFY Performed at [facility], [address] [place], [place] [address]    WBC 10/10/2022 5.3  4.0 - 10.5 K/uL Final   RBC 10/10/2022 4.89  4.22 - 5.81 MIL/uL Final   Hemoglobin 10/10/2022 14.7  13.0 - 17.0 g/dL Final   HCT 29/52/8413 44.6  39.0 - 52.0 % Final   MCV 10/10/2022 91.2  80.0 - 100.0 fL Final   MCH 10/10/2022 30.1  26.0 - 34.0 pg Final   MCHC 10/10/2022 33.0  30.0 - 36.0 g/dL Final   RDW 24/40/1027 13.1  11.5 - 15.5 % Final   Platelets 10/10/2022 235  150 - 400 K/uL Final   nRBC 10/10/2022 0.0  0.0 - 0.2 % Final   Performed at [facility], [address] [place], [place] [address]   Glucose-Capillary 10/10/2022 480 (H)  70 - 99 mg/dL Final   Glucose reference range applies only to samples taken after fasting for at least 8 hours.    Blood Alcohol level:  Lab Results  Component Value Date   ETH <10 03/02/2023   ETH <10 10/28/2022    Metabolic Disorder Labs: Lab Results  Component Value Date   HGBA1C 10.9 (H) 03/02/2023   MPG 266.13 03/02/2023   MPG 231.69 03/22/2021   No results found for: "PROLACTIN" Lab Results  Component Value Date   CHOL 112 11/30/2022   TRIG 82 11/30/2022   HDL 52 11/30/2022   CHOLHDL 2.2 11/30/2022   VLDL 21 04/10/2017   LDLCALC 44 11/30/2022   LDLCALC 99 03/24/2021    Therapeutic Lab Levels: No results found for: "LITHIUM" No results found for: "VALPROATE" No results found for: "CBMZ"  Physical Findings   GAD-7    Flowsheet Row Office Visit from 11/30/2022 in [facility]  Total GAD-7 Score 12      PHQ2-9    Flowsheet Row  ED from 03/03/2023 in [facility] Office Visit from 11/30/2022 in [facility] Office Visit from 03/24/2021 in [facility] Office Visit from 08/23/2018 in [facility] Office Visit from 05/08/2017 in [facility]  PHQ-2 Total Score 2 0 0 0 0  PHQ-9 Total Score 3 3 -- -- --      Flowsheet Row ED from 03/03/2023 in [facility] ED from 03/01/2023 in [facility] ED from 02/05/2023 in [facility]  C-SSRS RISK CATEGORY No Risk High Risk No Risk        Musculoskeletal  Strength & Muscle Tone: within normal limits Gait & Station: normal Patient leans: N/A  Psychiatric Specialty Exam  Presentation  General Appearance:  Appropriate for Environment; Casual  Eye Contact: Good  Speech: Clear and Coherent; Normal Rate  Speech Volume: Normal  Handedness: Right   Mood and Affect  Mood: Good  Affect: Full Range Euthymic, mood congruent   Thought Process  Thought Processes: Coherent; Linear  Descriptions of Associations:Intact  Orientation:Full (Time, Place and Person)  Thought Content:Logical; WDL  Diagnosis of Schizophrenia or Schizoaffective disorder in past: No    Hallucinations: None Ideas of Reference:None  Suicidal Thoughts: None Homicidal Thoughts: None  Sensorium  Memory: Immediate Good; Recent Good  Judgment: Good  Insight: Good   Executive Functions  Concentration: Good  Attention Span: Good  Recall: Good  Fund of Knowledge: Good  Language: Good   Psychomotor Activity  Psychomotor Activity: Normal  Assets  Assets: Communication Skills; Desire for Improvement; Social Support   Sleep  Sleep: Poor   Physical Exam  Physical Exam Vitals reviewed.  Constitutional:      General: He is not in acute distress.     Appearance: He is not toxic-appearing.  HENT:     Head: Normocephalic and atraumatic.     Mouth/Throat:     Mouth: Mucous membranes are moist.     Pharynx: Oropharynx is clear.  Pulmonary:     Effort: Pulmonary effort is normal.  Skin:    General: Skin is warm and dry.  Neurological:     General: No focal deficit present.     Mental Status: He is alert and oriented to person, place, and time.     Motor: No weakness.     Gait: Gait normal.    Review of Systems  Respiratory:  Negative for shortness of breath.   Cardiovascular:  Negative for chest pain.  Gastrointestinal:  Negative for abdominal pain, diarrhea, nausea and vomiting.  Neurological:  Negative for dizziness, tremors, seizures and headaches.   Blood pressure (!) 140/79, pulse 64, temperature 98.3 F (36.8 C), temperature source Oral, resp. rate 17, SpO2 100 %. There is no height or weight on file to calculate BMI.  Treatment Plan Summary: Daily contact with patient to assess and evaluate symptoms and progress in treatment and Medication management Dakota Kim is a 52 year old male with no formal psychiatric diagnoses prior to admission but who meets criteria for cocaine use disorder, nicotine use disorder, and prolonged grief who presented voluntarily to [facility] for detox and substance use treatment, for which he was admitted to [facility]. Patient remains motivated for residential substance use treatment and has good insight into his triggers. 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 He remains committed to working towards sobriety. LCSW assisting with dispo planning.  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  Continue hydroxyzine 25 mg p.o. TID as needed Continue trazodone 50 mg p.o. nightly as needed Continue gabapentin 300 mg p.o.TID Nicotine patch 14 mg daily  PRN  #T2DM Continue glipizide 5 mg p.o. daily Continue insulin coverage Semglee 20 units daily Continue NovoLog 10 units meal coverage See chart for sliding scale -CBGs: 231 to 296 over the past 24 hours  #Hyperlipidemia Continue Lipitor 10 mg daily   #Hypertension Start lisinopril if not improved? Given T2DM Or clonidine  Dispo: TBD   Elnora Morrison, Medical Student 03/07/2023 11:06 AM  Attestation for Student Documentation:  I personally was present and performed or re-performed the history, psychiatric specialty exam and medical decision-making activities of this service and have verified that the service and findings are accurately documented in the student's note.  Lamar Sprinkles, MD 03/07/2023, 5:53 PM

## 2023-03-07 NOTE — Discharge Planning (Signed)
LCSW followed up with [facility] and [employer] regarding patient referral. Per Levon Hedger at [facility], patient is still under review by the MD. Updates will be provided once received. LCSW followed up with Chanetta Marshall at [facility] and patient has been declined due to insulin needs. LCSW provided an update to the patient and patient expressed understanding. LCSW provided the patient with the application for [facility] in [place]. LCSW to fax off for review. LCSW and patient spoke about the options of [employer] and patient reports he would be interested in this option if not other option is available. LCSW will continue to provide updates once received. No other needs report at this time.   [name], LCSW Clinical Social Worker [facility] Ph: [phone]

## 2023-03-08 DIAGNOSIS — R45851 Suicidal ideations: Secondary | ICD-10-CM | POA: Diagnosis not present

## 2023-03-08 DIAGNOSIS — F1410 Cocaine abuse, uncomplicated: Secondary | ICD-10-CM | POA: Diagnosis not present

## 2023-03-08 DIAGNOSIS — Z5900 Homelessness unspecified: Secondary | ICD-10-CM | POA: Diagnosis not present

## 2023-03-08 DIAGNOSIS — Z794 Long term (current) use of insulin: Secondary | ICD-10-CM | POA: Diagnosis not present

## 2023-03-08 DIAGNOSIS — Z560 Unemployment, unspecified: Secondary | ICD-10-CM | POA: Diagnosis not present

## 2023-03-08 DIAGNOSIS — E119 Type 2 diabetes mellitus without complications: Secondary | ICD-10-CM | POA: Diagnosis not present

## 2023-03-08 LAB — GLUCOSE, CAPILLARY
Glucose-Capillary: 230 mg/dL — ABNORMAL HIGH (ref 70–99)
Glucose-Capillary: 188 mg/dL — ABNORMAL HIGH (ref 70–99)
Glucose-Capillary: 129 mg/dL — ABNORMAL HIGH (ref 70–99)

## 2023-03-08 MED ORDER — TRAZODONE HCL 100 MG PO TABS
100.0000 mg | ORAL_TABLET | Freq: Every evening | ORAL | Status: DC | PRN
  Administered 2023-03-08 – 2023-03-11 (×4): 100 mg via ORAL
  Administered 2023-03-12: 150 mg via ORAL
  Filled 2023-03-08 (×4): qty 1
  Filled 2023-03-08: qty 21
  Filled 2023-03-08: qty 1
  Filled 2023-03-08: qty 2
  Filled 2023-03-08: qty 42

## 2023-03-08 MED ORDER — INSULIN GLARGINE-YFGN 100 UNIT/ML ~~LOC~~ SOLN
30.0000 [IU] | Freq: Every day | SUBCUTANEOUS | Status: DC
  Administered 2023-03-09 – 2023-03-12 (×4): 30 [IU] via SUBCUTANEOUS
  Filled 2023-03-08: qty 10

## 2023-03-08 NOTE — ED Notes (Signed)
Patient is sleeping. Respirations equal and unlabored, skin warm and dry. No change in assessment or acuity. Routine safety checks conducted according to facility protocol. Will continue to monitor for safety.   

## 2023-03-08 NOTE — ED Notes (Signed)
Watch what he eats. Not allowed to have sugar.

## 2023-03-08 NOTE — ED Notes (Signed)
Patient remains asleep in bed at this time.  No complaints or distress.  Will moniotr.

## 2023-03-08 NOTE — Group Note (Signed)
Group Topic: Recovery Basics  Group Date: 03/08/2023 Start Time: 1430 End Time: 1515 Facilitators: Jenean Lindau, RN  Department: [facility]  Number of Participants: 6  Group Focus: chemical dependency education Treatment Modality:  Patient-Centered Therapy Interventions utilized were patient education and problem solving Purpose: express feelings, relapse prevention strategies, and trigger / craving management  Name: Dakota Kim Date of Birth: Mar 18, 1971  MR: [id number]    Level of Participation: active Quality of Participation: attentive and cooperative Interactions with others: gave feedback Mood/Affect: appropriate Triggers (if applicable):   Cognition: coherent/clear and goal directed Progress: Gaining insight Response:   Plan: follow-up needed  Patients Problems:  Patient Active Problem List   Diagnosis Date Noted   Prolonged grief disorder 03/05/2023   Polysubstance abuse (HCC) 03/03/2023   Substance induced mood disorder (HCC) 03/03/2023   Foot infection 11/30/2022   Hyperlipidemia 02/26/2019   Class 2 severe obesity due to excess calories with serious comorbidity and body mass index (BMI) of 35.0 to 35.9 in adult [facility]) 02/26/2019   Type 2 diabetes mellitus without complication, with long-term current use of insulin (HCC) 05/13/2017   Hypokalemia 11/06/2012

## 2023-03-08 NOTE — ED Provider Notes (Signed)
Behavioral Health Progress Note  Date and Time: 03/08/2023 8:13 AM Name: Dakota Kim MRN:  [id number]  Subjective:  Dakota Kim is a 52 year old male with no formal psychiatric diagnoses prior to admission but who meets criteria for cocaine use disorder, nicotine use disorder, and prolonged grief who presented voluntarily to [facility] for detox and substance use treatment, for which he was admitted to [facility]  On assessment today, patient is doing well. Trazodone dose was increased to 100 mg yesterday, which seems to have helped with sleep. Patient does note some vivid dreams last night, but content was unrelated to cocaine use. He was able to successfully return to bed and had a restful night of sleep.  Mood-wise, patient reports he has been doing well. He remains motivated for treatment at a residential facility. Patient is currently under consideration at [facility] given elevated A1C. He is open and motivated for [employer] in the possibility that he is not accepted to a residential facility. Blood glucose continues to be elevated in the 200s. Semglee has been increased to 30 units and will be administered nightly.  Patient's blood pressure has improved since yesterday to 129/91. He denies any somatic concerns and complaints. He denies SI, HI, AVH.   Diagnosis:  Final diagnoses:  Substance induced mood disorder (HCC)  Polysubstance abuse (HCC)  Prolonged grief disorder    Total Time spent with patient: 15 minutes  Past Psychiatric History: See HPI Past Medical History: See HPI Family History: See HPI Family Psychiatric  History: See HPI Social History: See HPI  Additional Social History:                         Sleep: Good  Appetite:  Good  Current Medications:  Current Facility-Administered Medications  Medication Dose Route Frequency Provider Last Rate Last Admin   acetaminophen (TYLENOL) tablet 650 mg  650 mg Oral Q6H PRN Rankin, Shuvon B, NP   650 mg at  03/06/23 2033   alum & mag hydroxide-simeth (MAALOX/MYLANTA) 200-200-20 MG/5ML suspension 30 mL  30 mL Oral Q4H PRN Rankin, Shuvon B, NP       atorvastatin (LIPITOR) tablet 10 mg  10 mg Oral Daily Rankin, Shuvon B, NP   10 mg at 03/07/23 0836   gabapentin (NEURONTIN) capsule 300 mg  300 mg Oral BID Rankin, Shuvon B, NP   300 mg at 03/07/23 2113   glipiZIDE (GLUCOTROL) tablet 5 mg  5 mg Oral QAC breakfast Rankin, Shuvon B, NP   5 mg at 03/07/23 0836   insulin aspart (novoLOG) injection 0-15 Units  0-15 Units Subcutaneous TID WC Rankin, Shuvon B, NP   5 Units at 03/07/23 1730   insulin aspart (novoLOG) injection 0-5 Units  0-5 Units Subcutaneous QHS Rankin, Shuvon B, NP   3 Units at 03/07/23 2139   insulin glargine-yfgn (SEMGLEE) injection 30 Units  30 Units Subcutaneous Daily Ajibola, Ene A, NP       nicotine (NICODERM CQ - dosed in mg/24 hours) patch 14 mg  14 mg Transdermal Daily PRN Lamar Sprinkles, MD       polyethylene glycol (MIRALAX / GLYCOLAX) packet 17 g  17 g Oral Daily PRN Ajibola, Ene A, NP   17 g at 03/03/23 2138   traZODone (DESYREL) tablet 50 mg  50 mg Oral QHS PRN Rankin, Shuvon B, NP   50 mg at 03/07/23 2113   Current Outpatient Medications  Medication Sig Dispense Refill   atorvastatin (LIPITOR)  10 MG tablet TAKE 1 TABLET (10 MG TOTAL) BY MOUTH DAILY. (Patient not taking: Reported on 03/02/2023) 30 tablet 11   gabapentin (NEURONTIN) 300 MG capsule Take 1 capsule (300 mg total) by mouth 2 (two) times daily. (Patient not taking: Reported on 03/02/2023) 60 capsule 2   glipiZIDE (GLUCOTROL) 5 MG tablet Take 1 tablet (5 mg total) by mouth daily before breakfast. (Patient not taking: Reported on 03/02/2023) 60 tablet 0   hydrOXYzine (ATARAX) 25 MG tablet Take 1 tablet (25 mg total) by mouth 3 (three) times daily as needed for anxiety. 30 tablet 0   insulin aspart (NOVOLOG) 100 UNIT/ML injection Inject 10 Units into the skin 3 (three) times daily with meals. 10 mL 11   insulin aspart  (NOVOLOG) 100 UNIT/ML injection Inject 0-15 Units into the skin 3 (three) times daily with meals. 10 mL 11   insulin aspart (NOVOLOG) 100 UNIT/ML injection Inject 0-5 Units into the skin at bedtime. 10 mL 11   Insulin Glargine (BASAGLAR KWIKPEN) 100 UNIT/ML Inject 15 Units into the skin at bedtime. (Patient taking differently: Inject 20 Units into the skin at bedtime.) 15 mL 1   insulin glargine-yfgn (SEMGLEE) 100 UNIT/ML injection Inject 0.2 mLs (20 Units total) into the skin daily. 10 mL 11   traZODone (DESYREL) 50 MG tablet Take 1 tablet (50 mg total) by mouth at bedtime as needed for sleep.      Labs  Lab Results:  Admission on 03/03/2023  Component Date Value Ref Range Status   Glucose-Capillary 03/03/2023 287 (H)  70 - 99 mg/dL Final   Glucose reference range applies only to samples taken after fasting for at least 8 hours.   Glucose-Capillary 03/04/2023 252 (H)  70 - 99 mg/dL Final   Glucose reference range applies only to samples taken after fasting for at least 8 hours.   Glucose-Capillary 03/04/2023 341 (H)  70 - 99 mg/dL Final   Glucose reference range applies only to samples taken after fasting for at least 8 hours.   Glucose-Capillary 03/04/2023 286 (H)  70 - 99 mg/dL Final   Glucose reference range applies only to samples taken after fasting for at least 8 hours.   Glucose-Capillary 03/03/2023 175 (H)  70 - 99 mg/dL Final   Glucose reference range applies only to samples taken after fasting for at least 8 hours.   Glucose-Capillary 03/04/2023 182 (H)  70 - 99 mg/dL Final   Glucose reference range applies only to samples taken after fasting for at least 8 hours.   Glucose-Capillary 03/05/2023 254 (H)  70 - 99 mg/dL Final   Glucose reference range applies only to samples taken after fasting for at least 8 hours.   Glucose-Capillary 03/05/2023 163 (H)  70 - 99 mg/dL Final   Glucose reference range applies only to samples taken after fasting for at least 8 hours.    Glucose-Capillary 03/05/2023 242 (H)  70 - 99 mg/dL Final   Glucose reference range applies only to samples taken after fasting for at least 8 hours.   Glucose-Capillary 03/05/2023 387 (H)  70 - 99 mg/dL Final   Glucose reference range applies only to samples taken after fasting for at least 8 hours.   Glucose-Capillary 03/06/2023 232 (H)  70 - 99 mg/dL Final   Glucose reference range applies only to samples taken after fasting for at least 8 hours.   Glucose-Capillary 03/06/2023 231 (H)  70 - 99 mg/dL Final   Glucose reference range applies only to  samples taken after fasting for at least 8 hours.   Glucose-Capillary 03/06/2023 296 (H)  70 - 99 mg/dL Final   Glucose reference range applies only to samples taken after fasting for at least 8 hours.   Glucose-Capillary 03/06/2023 257 (H)  70 - 99 mg/dL Final   Glucose reference range applies only to samples taken after fasting for at least 8 hours.   Glucose-Capillary 03/07/2023 269 (H)  70 - 99 mg/dL Final   Glucose reference range applies only to samples taken after fasting for at least 8 hours.   Glucose-Capillary 03/06/2023 250 (H)  70 - 99 mg/dL Final   Glucose reference range applies only to samples taken after fasting for at least 8 hours.   Glucose-Capillary 03/07/2023 256 (H)  70 - 99 mg/dL Final   Glucose reference range applies only to samples taken after fasting for at least 8 hours.   Glucose-Capillary 03/07/2023 237 (H)  70 - 99 mg/dL Final   Glucose reference range applies only to samples taken after fasting for at least 8 hours.   Glucose-Capillary 03/07/2023 270 (H)  70 - 99 mg/dL Final   Glucose reference range applies only to samples taken after fasting for at least 8 hours.  Admission on 03/01/2023, Discharged on 03/03/2023  Component Date Value Ref Range Status   WBC 03/02/2023 5.7  4.0 - 10.5 K/uL Final   RBC 03/02/2023 4.71  4.22 - 5.81 MIL/uL Final   Hemoglobin 03/02/2023 13.7  13.0 - 17.0 g/dL Final   HCT 16/06/9603  43.7  39.0 - 52.0 % Final   MCV 03/02/2023 92.8  80.0 - 100.0 fL Final   MCH 03/02/2023 29.1  26.0 - 34.0 pg Final   MCHC 03/02/2023 31.4  30.0 - 36.0 g/dL Final   RDW 54/05/8118 14.1  11.5 - 15.5 % Final   Platelets 03/02/2023 256  150 - 400 K/uL Final   nRBC 03/02/2023 0.0  0.0 - 0.2 % Final   Neutrophils Relative % 03/02/2023 53  % Final   Neutro Abs 03/02/2023 3.1  1.7 - 7.7 K/uL Final   Lymphocytes Relative 03/02/2023 32  % Final   Lymphs Abs 03/02/2023 1.8  0.7 - 4.0 K/uL Final   Monocytes Relative 03/02/2023 11  % Final   Monocytes Absolute 03/02/2023 0.6  0.1 - 1.0 K/uL Final   Eosinophils Relative 03/02/2023 2  % Final   Eosinophils Absolute 03/02/2023 0.1  0.0 - 0.5 K/uL Final   Basophils Relative 03/02/2023 1  % Final   Basophils Absolute 03/02/2023 0.0  0.0 - 0.1 K/uL Final   Immature Granulocytes 03/02/2023 1  % Final   Abs Immature Granulocytes 03/02/2023 0.04  0.00 - 0.07 K/uL Final   Performed at [facility], [address] [place], [place] [address]   Sodium 03/02/2023 137  135 - 145 mmol/L Final   Potassium 03/02/2023 5.0  3.5 - 5.1 mmol/L Final   Chloride 03/02/2023 100  98 - 111 mmol/L Final   CO2 03/02/2023 27  22 - 32 mmol/L Final   Glucose, Bld 03/02/2023 424 (H)  70 - 99 mg/dL Final   Glucose reference range applies only to samples taken after fasting for at least 8 hours.   BUN 03/02/2023 14  6 - 20 mg/dL Final   Creatinine, Ser 03/02/2023 0.90  0.61 - 1.24 mg/dL Final   Calcium 95/62/1308 9.3  8.9 - 10.3 mg/dL Final   Total Protein 65/78/4696 6.5  6.5 - 8.1 g/dL Final  Albumin 03/02/2023 3.6  3.5 - 5.0 g/dL Final   AST 87/56/4332 13 (L)  15 - 41 U/L Final   ALT 03/02/2023 16  0 - 44 U/L Final   Alkaline Phosphatase 03/02/2023 65  38 - 126 U/L Final   Total Bilirubin 03/02/2023 0.5  0.3 - 1.2 mg/dL Final   GFR, Estimated 03/02/2023 >60  >60 mL/min Final   Comment: (NOTE) Calculated using the CKD-EPI Creatinine Equation (2021)    Anion gap  03/02/2023 10  5 - 15 Final   Performed at [facility], [address] [place], [place] [address]   Hgb A1c MFr Bld 03/02/2023 10.9 (H)  4.8 - 5.6 % Final   Comment: (NOTE) Pre diabetes:          5.7%-6.4%  Diabetes:              >6.4%  Glycemic control for   <7.0% adults with diabetes    Mean Plasma Glucose 03/02/2023 266.13  mg/dL Final   Performed at [facility], [address], [place], [place] [address]   Alcohol, Ethyl (B) 03/02/2023 <10  <10 mg/dL Final   Comment: (NOTE) Lowest detectable limit for serum alcohol is 10 mg/dL.  For medical purposes only. Performed at [facility], [address], [place], [place] [address]    TSH 03/02/2023 1.178  0.350 - 4.500 uIU/mL Final   Comment: Performed by a 3rd Generation assay with a functional sensitivity of <=0.01 uIU/mL. Performed at [facility], [address] [place], [place] [address]    POC Amphetamine UR 03/02/2023 None Detected  NONE DETECTED (Cut Off Level 1000 ng/mL) Final   POC Secobarbital (BAR) 03/02/2023 None Detected  NONE DETECTED (Cut Off Level 300 ng/mL) Final   POC Buprenorphine (BUP) 03/02/2023 None Detected  NONE DETECTED (Cut Off Level 10 ng/mL) Final   POC Oxazepam (BZO) 03/02/2023 None Detected  NONE DETECTED (Cut Off Level 300 ng/mL) Final   POC Cocaine UR 03/02/2023 Positive (A)  NONE DETECTED (Cut Off Level 300 ng/mL) Final   POC Methamphetamine UR 03/02/2023 None Detected  NONE DETECTED (Cut Off Level 1000 ng/mL) Final   POC Morphine 03/02/2023 None Detected  NONE DETECTED (Cut Off Level 300 ng/mL) Final   POC Methadone UR 03/02/2023 None Detected  NONE DETECTED (Cut Off Level 300 ng/mL) Final   POC Oxycodone UR 03/02/2023 None Detected  NONE DETECTED (Cut Off Level 100 ng/mL) Final   POC Marijuana UR 03/02/2023 Positive (A)  NONE DETECTED (Cut Off Level 50 ng/mL) Final   Glucose-Capillary 03/02/2023 499 (H)  70 - 99 mg/dL Final   Glucose reference range applies only to  samples taken after fasting for at least 8 hours.   Glucose-Capillary 03/02/2023 404 (H)  70 - 99 mg/dL Final   Glucose reference range applies only to samples taken after fasting for at least 8 hours.   Glucose-Capillary 03/02/2023 214 (H)  70 - 99 mg/dL Final   Glucose reference range applies only to samples taken after fasting for at least 8 hours.   Glucose-Capillary 03/02/2023 265 (H)  70 - 99 mg/dL Final   Glucose reference range applies only to samples taken after fasting for at least 8 hours.   Glucose-Capillary 03/03/2023 263 (H)  70 - 99 mg/dL Final   Glucose reference range applies only to samples taken after fasting for at least 8 hours.   Glucose-Capillary 03/03/2023 258 (H)  70 - 99 mg/dL Final  Glucose reference range applies only to samples taken after fasting for at least 8 hours.  Admission on 02/05/2023, Discharged on 02/05/2023  Component Date Value Ref Range Status   Glucose-Capillary 02/05/2023 380 (H)  70 - 99 mg/dL Final   Glucose reference range applies only to samples taken after fasting for at least 8 hours.   Sodium 02/05/2023 138  135 - 145 mmol/L Final   Potassium 02/05/2023 4.0  3.5 - 5.1 mmol/L Final   Chloride 02/05/2023 103  98 - 111 mmol/L Final   CO2 02/05/2023 26  22 - 32 mmol/L Final   Glucose, Bld 02/05/2023 380 (H)  70 - 99 mg/dL Final   Glucose reference range applies only to samples taken after fasting for at least 8 hours.   BUN 02/05/2023 11  6 - 20 mg/dL Final   Creatinine, Ser 02/05/2023 0.92  0.61 - 1.24 mg/dL Final   Calcium 40/98/1191 9.1  8.9 - 10.3 mg/dL Final   GFR, Estimated 02/05/2023 >60  >60 mL/min Final   Comment: (NOTE) Calculated using the CKD-EPI Creatinine Equation (2021)    Anion gap 02/05/2023 9  5 - 15 Final   Performed at [facility], [address] [place], [place] [address]   WBC 02/05/2023 7.2  4.0 - 10.5 K/uL Final   RBC 02/05/2023 5.40  4.22 - 5.81 MIL/uL Final   Hemoglobin 02/05/2023 16.0  13.0 - 17.0 g/dL  Final   HCT 56/21/3086 49.7  39.0 - 52.0 % Final   MCV 02/05/2023 92.0  80.0 - 100.0 fL Final   MCH 02/05/2023 29.6  26.0 - 34.0 pg Final   MCHC 02/05/2023 32.2  30.0 - 36.0 g/dL Final   RDW 57/84/6962 13.9  11.5 - 15.5 % Final   Platelets 02/05/2023 266  150 - 400 K/uL Final   nRBC 02/05/2023 0.0  0.0 - 0.2 % Final   Performed at [facility], [address] [place], [place] [address]   Glucose-Capillary 02/05/2023 342 (H)  70 - 99 mg/dL Final   Glucose reference range applies only to samples taken after fasting for at least 8 hours.   Total Protein 02/05/2023 7.5  6.5 - 8.1 g/dL Final   Albumin 13/24/4010 4.1  3.5 - 5.0 g/dL Final   AST 27/25/3664 16  15 - 41 U/L Final   ALT 02/05/2023 12  0 - 44 U/L Final   Alkaline Phosphatase 02/05/2023 83  38 - 126 U/L Final   Total Bilirubin 02/05/2023 1.2  0.3 - 1.2 mg/dL Final   Bilirubin, Direct 02/05/2023 0.3 (H)  0.0 - 0.2 mg/dL Final   Indirect Bilirubin 02/05/2023 0.9  0.3 - 0.9 mg/dL Final   Performed at [facility], [address] [place], [place] [address]   Lipase 02/05/2023 36  11 - 51 U/L Final   Performed at [facility], [address], [place], [place] [address]   pH, Ven 02/05/2023 7.352  7.25 - 7.43 Final   pCO2, Ven 02/05/2023 48.6  44 - 60 mmHg Final   pO2, Ven 02/05/2023 28 (LL)  32 - 45 mmHg Final   Bicarbonate 02/05/2023 26.9  20.0 - 28.0 mmol/L Final   TCO2 02/05/2023 28  22 - 32 mmol/L Final   O2 Saturation 02/05/2023 49  % Final   Acid-Base Excess 02/05/2023 0.0  0.0 - 2.0 mmol/L Final   Sodium 02/05/2023 140  135 - 145 mmol/L Final   Potassium 02/05/2023 3.9  3.5 - 5.1 mmol/L Final   Calcium, Ion 02/05/2023 1.22  1.15 - 1.40 mmol/L Final   HCT 02/05/2023 50.0  39.0 - 52.0 % Final   Hemoglobin 02/05/2023 17.0  13.0 - 17.0 g/dL Final   Sample type 09/81/1914 VENOUS   Final   Comment 02/05/2023 NOTIFIED PHYSICIAN   Final   Beta-Hydroxybutyric Acid 02/05/2023 0.21  0.05 - 0.27 mmol/L Final    Performed at [facility], [address], [place], [place] [address]   Glucose-Capillary 02/05/2023 213 (H)  70 - 99 mg/dL Final   Glucose reference range applies only to samples taken after fasting for at least 8 hours.  Admission on 01/27/2023, Discharged on 01/27/2023  Component Date Value Ref Range Status   Glucose-Capillary 01/27/2023 454 (H)  70 - 99 mg/dL Final   Glucose reference range applies only to samples taken after fasting for at least 8 hours.   WBC 01/27/2023 5.3  4.0 - 10.5 K/uL Final   RBC 01/27/2023 4.77  4.22 - 5.81 MIL/uL Final   Hemoglobin 01/27/2023 14.2  13.0 - 17.0 g/dL Final   HCT 62/13/0865 43.5  39.0 - 52.0 % Final   MCV 01/27/2023 91.2  80.0 - 100.0 fL Final   MCH 01/27/2023 29.8  26.0 - 34.0 pg Final   MCHC 01/27/2023 32.6  30.0 - 36.0 g/dL Final   RDW 78/46/9629 13.9  11.5 - 15.5 % Final   Platelets 01/27/2023 238  150 - 400 K/uL Final   nRBC 01/27/2023 0.0  0.0 - 0.2 % Final   Neutrophils Relative % 01/27/2023 68  % Final   Neutro Abs 01/27/2023 3.5  1.7 - 7.7 K/uL Final   Lymphocytes Relative 01/27/2023 23  % Final   Lymphs Abs 01/27/2023 1.2  0.7 - 4.0 K/uL Final   Monocytes Relative 01/27/2023 9  % Final   Monocytes Absolute 01/27/2023 0.5  0.1 - 1.0 K/uL Final   Eosinophils Relative 01/27/2023 0  % Final   Eosinophils Absolute 01/27/2023 0.0  0.0 - 0.5 K/uL Final   Basophils Relative 01/27/2023 0  % Final   Basophils Absolute 01/27/2023 0.0  0.0 - 0.1 K/uL Final   Immature Granulocytes 01/27/2023 0  % Final   Abs Immature Granulocytes 01/27/2023 0.02  0.00 - 0.07 K/uL Final   Performed at [facility], [address], [place], [place] [address]   Sodium 01/27/2023 133 (L)  135 - 145 mmol/L Final   Potassium 01/27/2023 4.5  3.5 - 5.1 mmol/L Final   Chloride 01/27/2023 99  98 - 111 mmol/L Final   CO2 01/27/2023 25  22 - 32 mmol/L Final   Glucose, Bld 01/27/2023 410 (H)  70 - 99 mg/dL Final   Glucose reference range applies  only to samples taken after fasting for at least 8 hours.   BUN 01/27/2023 17  6 - 20 mg/dL Final   Creatinine, Ser 01/27/2023 0.89  0.61 - 1.24 mg/dL Final   Calcium 32/44/0102 8.5 (L)  8.9 - 10.3 mg/dL Final   Total Protein 72/53/6644 6.8  6.5 - 8.1 g/dL Final   Albumin 03/47/4259 3.6  3.5 - 5.0 g/dL Final   AST 56/38/7564 19  15 - 41 U/L Final   ALT 01/27/2023 15  0 - 44 U/L Final   Alkaline Phosphatase 01/27/2023 60  38 - 126 U/L Final   Total Bilirubin 01/27/2023 0.7  0.3 - 1.2 mg/dL Final   GFR, Estimated 01/27/2023 >60  >60 mL/min Final  Comment: (NOTE) Calculated using the CKD-EPI Creatinine Equation (2021)    Anion gap 01/27/2023 9  5 - 15 Final   Performed at [facility], [address] [place], [place] [address]   Glucose-Capillary 01/27/2023 339 (H)  70 - 99 mg/dL Final   Glucose reference range applies only to samples taken after fasting for at least 8 hours.  Admission on 12/23/2022, Discharged on 12/23/2022  Component Date Value Ref Range Status   Glucose-Capillary 12/23/2022 339 (H)  70 - 99 mg/dL Final   Glucose reference range applies only to samples taken after fasting for at least 8 hours.   Comment 1 12/23/2022 Notify RN   Final   Sodium 12/23/2022 132 (L)  135 - 145 mmol/L Final   Potassium 12/23/2022 4.0  3.5 - 5.1 mmol/L Final   Chloride 12/23/2022 102  98 - 111 mmol/L Final   CO2 12/23/2022 26  22 - 32 mmol/L Final   Glucose, Bld 12/23/2022 351 (H)  70 - 99 mg/dL Final   Glucose reference range applies only to samples taken after fasting for at least 8 hours.   BUN 12/23/2022 13  6 - 20 mg/dL Final   Creatinine, Ser 12/23/2022 0.70  0.61 - 1.24 mg/dL Final   Calcium 60/45/4098 8.3 (L)  8.9 - 10.3 mg/dL Final   GFR, Estimated 12/23/2022 >60  >60 mL/min Final   Comment: (NOTE) Calculated using the CKD-EPI Creatinine Equation (2021)    Anion gap 12/23/2022 4 (L)  5 - 15 Final   Performed at [facility], [address] [place], [place] [address]   WBC 12/23/2022 6.9  4.0 - 10.5 K/uL Final   RBC 12/23/2022 4.60  4.22 - 5.81 MIL/uL Final   Hemoglobin 12/23/2022 13.6  13.0 - 17.0 g/dL Final   HCT 78/29/5621 42.7  39.0 - 52.0 % Final   MCV 12/23/2022 92.8  80.0 - 100.0 fL Final   MCH 12/23/2022 29.6  26.0 - 34.0 pg Final   MCHC 12/23/2022 31.9  30.0 - 36.0 g/dL Final   RDW 30/86/5784 13.4  11.5 - 15.5 % Final   Platelets 12/23/2022 268  150 - 400 K/uL Final   nRBC 12/23/2022 0.0  0.0 - 0.2 % Final   Performed at [facility], [address] [place], [place] [address]   Color, Urine 12/23/2022 YELLOW  YELLOW Final   APPearance 12/23/2022 CLEAR  CLEAR Final   Specific Gravity, Urine 12/23/2022 1.026  1.005 - 1.030 Final   pH 12/23/2022 7.0  5.0 - 8.0 Final   Glucose, UA 12/23/2022 >=500 (A)  NEGATIVE mg/dL Final   Hgb urine dipstick 12/23/2022 NEGATIVE  NEGATIVE Final   Bilirubin Urine 12/23/2022 NEGATIVE  NEGATIVE Final   Ketones, ur 12/23/2022 NEGATIVE  NEGATIVE mg/dL Final   Protein, ur 52/84/1324 NEGATIVE  NEGATIVE mg/dL Final   Nitrite 40/06/2724 NEGATIVE  NEGATIVE Final   Leukocytes,Ua 12/23/2022 NEGATIVE  NEGATIVE Final   RBC / HPF 12/23/2022 0-5  0 - 5 RBC/hpf Final   WBC, UA 12/23/2022 0-5  0 - 5 WBC/hpf Final   Bacteria, UA 12/23/2022 NONE SEEN  NONE SEEN Final   Squamous Epithelial / HPF 12/23/2022 0-5  0 - 5 /HPF Final   Performed at [facility], [address] [place], [place] [address]   Glucose-Capillary 12/23/2022 295 (H)  70 - 99 mg/dL Final   Glucose reference range applies only to samples taken after fasting for at least  8 hours.   Glucose-Capillary 12/23/2022 231 (H)  70 - 99 mg/dL Final   Glucose reference range applies only to samples taken after fasting for at least 8 hours.  Office Visit on 11/30/2022  Component Date Value Ref Range Status   Creatinine, Urine 11/30/2022 108.2  Not Estab. mg/dL Final   Microalbumin, Urine  11/30/2022 12.1  Not Estab. ug/mL Final   Microalb/Creat Ratio 11/30/2022 11  0 - 29 mg/g creat Final   Comment:                        Normal:                0 -  29                        Moderately increased: 30 - 300                        Severely increased:       >300    Glucose 11/30/2022 49 (L)  70 - 99 mg/dL Final   BUN 65/78/4696 11  6 - 24 mg/dL Final   Creatinine, Ser 11/30/2022 0.77  0.76 - 1.27 mg/dL Final   eGFR 29/52/8413 108  >59 mL/min/1.73 Final   BUN/Creatinine Ratio 11/30/2022 14  9 - 20 Final   Sodium 11/30/2022 144  134 - 144 mmol/L Final   Potassium 11/30/2022 5.2  3.5 - 5.2 mmol/L Final   Chloride 11/30/2022 104  96 - 106 mmol/L Final   CO2 11/30/2022 26  20 - 29 mmol/L Final   Calcium 11/30/2022 9.3  8.7 - 10.2 mg/dL Final   Total Protein 24/40/1027 7.0  6.0 - 8.5 g/dL Final   Albumin 25/36/6440 4.2  3.8 - 4.9 g/dL Final   Globulin, Total 11/30/2022 2.8  1.5 - 4.5 g/dL Final   Albumin/Globulin Ratio 11/30/2022 1.5  1.2 - 2.2 Final   Bilirubin Total 11/30/2022 0.2  0.0 - 1.2 mg/dL Final   Alkaline Phosphatase 11/30/2022 66  44 - 121 IU/L Final   AST 11/30/2022 16  0 - 40 IU/L Final   ALT 11/30/2022 13  0 - 44 IU/L Final   Hemoglobin A1C 11/30/2022 9.0 (A)  4.0 - 5.6 % Final   POC Glucose 11/30/2022 60 (A)  70 - 99 mg/dl Final   WBC 34/74/2595 7.7  3.4 - 10.8 x10E3/uL Final   RBC 11/30/2022 4.94  4.14 - 5.80 x10E6/uL Final   Hemoglobin 11/30/2022 14.6  13.0 - 17.7 g/dL Final   Hematocrit 63/87/5643 45.2  37.5 - 51.0 % Final   MCV 11/30/2022 92  79 - 97 fL Final   MCH 11/30/2022 29.6  26.6 - 33.0 pg Final   MCHC 11/30/2022 32.3  31.5 - 35.7 g/dL Final   RDW 32/95/1884 12.7  11.6 - 15.4 % Final   Platelets 11/30/2022 269  150 - 450 x10E3/uL Final   Neutrophils 11/30/2022 61  Not Estab. % Final   Lymphs 11/30/2022 28  Not Estab. % Final   Monocytes 11/30/2022 10  Not Estab. % Final   Eos 11/30/2022 1  Not Estab. % Final   Basos 11/30/2022 0  Not Estab. %  Final   Neutrophils Absolute 11/30/2022 4.6  1.4 - 7.0 x10E3/uL Final   Lymphocytes Absolute 11/30/2022 2.2  0.7 - 3.1 x10E3/uL Final   Monocytes Absolute 11/30/2022 0.8  0.1 - 0.9 x10E3/uL Final  EOS (ABSOLUTE) 11/30/2022 0.1  0.0 - 0.4 x10E3/uL Final   Basophils Absolute 11/30/2022 0.0  0.0 - 0.2 x10E3/uL Final   Immature Granulocytes 11/30/2022 0  Not Estab. % Final   Immature Grans (Abs) 11/30/2022 0.0  0.0 - 0.1 x10E3/uL Final   Cholesterol, Total 11/30/2022 112  100 - 199 mg/dL Final   Triglycerides 51/76/1607 82  0 - 149 mg/dL Final   HDL 37/06/6268 52  >39 mg/dL Final   VLDL Cholesterol Cal 11/30/2022 16  5 - 40 mg/dL Final   LDL Chol Calc (NIH) 11/30/2022 44  0 - 99 mg/dL Final   Chol/HDL Ratio 11/30/2022 2.2  0.0 - 5.0 ratio Final   Comment:                                   T. Chol/HDL Ratio                                             Men  Women                               1/2 Avg.Risk  3.4    3.3                                   Avg.Risk  5.0    4.4                                2X Avg.Risk  9.6    7.1                                3X Avg.Risk 23.4   11.0   Admission on 10/28/2022, Discharged on 10/28/2022  Component Date Value Ref Range Status   WBC 10/28/2022 5.9  4.0 - 10.5 K/uL Final   RBC 10/28/2022 4.74  4.22 - 5.81 MIL/uL Final   Hemoglobin 10/28/2022 14.2  13.0 - 17.0 g/dL Final   HCT 48/54/6270 44.3  39.0 - 52.0 % Final   MCV 10/28/2022 93.5  80.0 - 100.0 fL Final   MCH 10/28/2022 30.0  26.0 - 34.0 pg Final   MCHC 10/28/2022 32.1  30.0 - 36.0 g/dL Final   RDW 35/00/9381 13.9  11.5 - 15.5 % Final   Platelets 10/28/2022 244  150 - 400 K/uL Final   nRBC 10/28/2022 0.0  0.0 - 0.2 % Final   Neutrophils Relative % 10/28/2022 72  % Final   Neutro Abs 10/28/2022 4.3  1.7 - 7.7 K/uL Final   Lymphocytes Relative 10/28/2022 19  % Final   Lymphs Abs 10/28/2022 1.1  0.7 - 4.0 K/uL Final   Monocytes Relative 10/28/2022 8  % Final   Monocytes Absolute 10/28/2022 0.5   0.1 - 1.0 K/uL Final   Eosinophils Relative 10/28/2022 1  % Final   Eosinophils Absolute 10/28/2022 0.0  0.0 - 0.5 K/uL Final   Basophils Relative 10/28/2022 0  % Final   Basophils Absolute 10/28/2022 0.0  0.0 - 0.1 K/uL Final   Immature Granulocytes 10/28/2022 0  % Final  Abs Immature Granulocytes 10/28/2022 0.01  0.00 - 0.07 K/uL Final   Performed at [facility], [address] [place], [place] [address]   Lipase 10/28/2022 137 (H)  11 - 51 U/L Final   Performed at [facility], [address], [place], [place] [address]   Opiates 10/28/2022 NONE DETECTED  NONE DETECTED Final   Cocaine 10/28/2022 POSITIVE (A)  NONE DETECTED Final   Benzodiazepines 10/28/2022 NONE DETECTED  NONE DETECTED Final   Amphetamines 10/28/2022 NONE DETECTED  NONE DETECTED Final   Tetrahydrocannabinol 10/28/2022 NONE DETECTED  NONE DETECTED Final   Barbiturates 10/28/2022 NONE DETECTED  NONE DETECTED Final   Comment: (NOTE) DRUG SCREEN FOR MEDICAL PURPOSES ONLY.  IF CONFIRMATION IS NEEDED FOR ANY PURPOSE, NOTIFY LAB WITHIN 5 DAYS.  LOWEST DETECTABLE LIMITS FOR URINE DRUG SCREEN Drug Class                     Cutoff (ng/mL) Amphetamine and metabolites    1000 Barbiturate and metabolites    200 Benzodiazepine                 200 Opiates and metabolites        300 Cocaine and metabolites        300 THC                            50 Performed at [facility], [address], [place], [place] [address]    Alcohol, Ethyl (B) 10/28/2022 <10  <10 mg/dL Final   Comment: (NOTE) Lowest detectable limit for serum alcohol is 10 mg/dL.  For medical purposes only. Performed at [facility], [address] [place], [place] [address]    Sodium 10/28/2022 135  135 - 145 mmol/L Final   Potassium 10/28/2022 4.0  3.5 - 5.1 mmol/L Final   Chloride 10/28/2022 99  98 - 111 mmol/L Final   CO2 10/28/2022 23  22 - 32 mmol/L Final   Glucose, Bld 10/28/2022 273 (H)  70 - 99 mg/dL Final   Glucose  reference range applies only to samples taken after fasting for at least 8 hours.   BUN 10/28/2022 7  6 - 20 mg/dL Final   Creatinine, Ser 10/28/2022 0.69  0.61 - 1.24 mg/dL Final   Calcium 29/56/2130 9.0  8.9 - 10.3 mg/dL Final   Total Protein 86/57/8469 7.2  6.5 - 8.1 g/dL Final   Albumin 62/95/2841 3.8  3.5 - 5.0 g/dL Final   AST 32/44/0102 22  15 - 41 U/L Final   ALT 10/28/2022 17  0 - 44 U/L Final   Alkaline Phosphatase 10/28/2022 65  38 - 126 U/L Final   Total Bilirubin 10/28/2022 0.3  0.3 - 1.2 mg/dL Final   GFR, Estimated 10/28/2022 >60  >60 mL/min Final   Comment: (NOTE) Calculated using the CKD-EPI Creatinine Equation (2021)    Anion gap 10/28/2022 13  5 - 15 Final   Performed at [facility], [address], [place], [place] [address]   Salicylate Lvl 10/28/2022 <7.0 (L)  7.0 - 30.0 mg/dL Final   Performed at [facility], [address] [place], [place] [address]   Acetaminophen (Tylenol), Serum 10/28/2022 <10 (L)  10 - 30 ug/mL Final   Comment: (NOTE) Therapeutic concentrations vary significantly. A range of 10-30 ug/mL  may be an effective concentration for many patients. However, some  are best treated at concentrations outside of this range. Acetaminophen concentrations >150 ug/mL at 4 hours after ingestion  and >50 ug/mL at 12 hours after ingestion are often associated with  toxic reactions.  Performed at [facility], [address], [place], [place] [address]   Admission on 10/10/2022, Discharged on 10/10/2022  Component Date Value Ref Range Status   Glucose-Capillary 10/10/2022 >600 (HH)  70 - 99 mg/dL Final   Glucose reference range applies only to samples taken after fasting for at least 8 hours.   Sodium 10/10/2022 129 (L)  135 - 145 mmol/L Final   Potassium 10/10/2022 4.4  3.5 - 5.1 mmol/L Final   Chloride 10/10/2022 95 (L)  98 - 111 mmol/L Final   CO2 10/10/2022 24  22 - 32 mmol/L Final   Glucose, Bld 10/10/2022 683 (HH)  70 - 99 mg/dL  Final   Comment: CRITICAL RESULT CALLED TO, READ BACK BY AND VERIFIED WITH LAMB,S. RN AT 1219 10/10/22 MULLINS,T Glucose reference range applies only to samples taken after fasting for at least 8 hours.    BUN 10/10/2022 16  6 - 20 mg/dL Final   Creatinine, Ser 10/10/2022 0.93  0.61 - 1.24 mg/dL Final   Calcium 62/13/0865 8.4 (L)  8.9 - 10.3 mg/dL Final   Total Protein 78/46/9629 6.9  6.5 - 8.1 g/dL Final   Albumin 52/84/1324 3.6  3.5 - 5.0 g/dL Final   AST 40/06/2724 16  15 - 41 U/L Final   ALT 10/10/2022 11  0 - 44 U/L Final   Alkaline Phosphatase 10/10/2022 61  38 - 126 U/L Final   Total Bilirubin 10/10/2022 0.7  0.3 - 1.2 mg/dL Final   GFR, Estimated 10/10/2022 >60  >60 mL/min Final   Comment: (NOTE) Calculated using the CKD-EPI Creatinine Equation (2021)    Anion gap 10/10/2022 10  5 - 15 Final   Performed at [facility], [address], [place], [place] [address]   Color, Urine 10/10/2022 STRAW (A)  YELLOW Final   APPearance 10/10/2022 CLEAR  CLEAR Final   Specific Gravity, Urine 10/10/2022 1.031 (H)  1.005 - 1.030 Final   pH 10/10/2022 5.0  5.0 - 8.0 Final   Glucose, UA 10/10/2022 >=500 (A)  NEGATIVE mg/dL Final   Hgb urine dipstick 10/10/2022 NEGATIVE  NEGATIVE Final   Bilirubin Urine 10/10/2022 NEGATIVE  NEGATIVE Final   Ketones, ur 10/10/2022 NEGATIVE  NEGATIVE mg/dL Final   Protein, ur 03/47/4259 NEGATIVE  NEGATIVE mg/dL Final   Nitrite 56/38/7564 NEGATIVE  NEGATIVE Final   Leukocytes,Ua 10/10/2022 NEGATIVE  NEGATIVE Final   RBC / HPF 10/10/2022 0-5  0 - 5 RBC/hpf Final   WBC, UA 10/10/2022 0-5  0 - 5 WBC/hpf Final   Bacteria, UA 10/10/2022 NONE SEEN  NONE SEEN Final   Squamous Epithelial / HPF 10/10/2022 0-5  0 - 5 /HPF Final   Performed at [facility], [address] [place], [place] [address]   Beta-Hydroxybutyric Acid 10/10/2022 0.41 (H)  0.05 - 0.27 mmol/L Final   Performed at [facility], [address] [place], [place] [address]   pH, Ven 10/10/2022 7.39  7.25 - 7.43 Final   pCO2, Ven 10/10/2022 42 (L)  44 - 60 mmHg Final   pO2, Ven 10/10/2022 59 (H)  32 - 45 mmHg Final   Bicarbonate 10/10/2022 25.4  20.0 - 28.0 mmol/L Final   Acid-Base Excess 10/10/2022 0.3  0.0 - 2.0 mmol/L Final  O2 Saturation 10/10/2022 92.1  % Final   Patient temperature 10/10/2022 37.0   Final   Performed at [facility], [address] [place], [place] [address]   Osmolality 10/10/2022 315 (H)  275 - 295 mOsm/kg Final   Comment: REPEATED TO VERIFY Performed at [facility], [address] [place], [place] [address]    WBC 10/10/2022 5.3  4.0 - 10.5 K/uL Final   RBC 10/10/2022 4.89  4.22 - 5.81 MIL/uL Final   Hemoglobin 10/10/2022 14.7  13.0 - 17.0 g/dL Final   HCT 96/29/5284 44.6  39.0 - 52.0 % Final   MCV 10/10/2022 91.2  80.0 - 100.0 fL Final   MCH 10/10/2022 30.1  26.0 - 34.0 pg Final   MCHC 10/10/2022 33.0  30.0 - 36.0 g/dL Final   RDW 13/24/4010 13.1  11.5 - 15.5 % Final   Platelets 10/10/2022 235  150 - 400 K/uL Final   nRBC 10/10/2022 0.0  0.0 - 0.2 % Final   Performed at [facility], [address], [place], [place] [address]   Glucose-Capillary 10/10/2022 480 (H)  70 - 99 mg/dL Final   Glucose reference range applies only to samples taken after fasting for at least 8 hours.    Blood Alcohol level:  Lab Results  Component Value Date   ETH <10 03/02/2023   ETH <10 10/28/2022    Metabolic Disorder Labs: Lab Results  Component Value Date   HGBA1C 10.9 (H) 03/02/2023   MPG 266.13 03/02/2023   MPG 231.69 03/22/2021   No results found for: "PROLACTIN" Lab Results  Component Value Date   CHOL 112 11/30/2022   TRIG 82 11/30/2022   HDL 52 11/30/2022   CHOLHDL 2.2 11/30/2022   VLDL 21 04/10/2017   LDLCALC 44 11/30/2022   LDLCALC 99 03/24/2021    Therapeutic Lab Levels: No results found for: "LITHIUM" No results found for:  "VALPROATE" No results found for: "CBMZ"  Physical Findings   GAD-7    Flowsheet Row Office Visit from 11/30/2022 in [facility]  Total GAD-7 Score 12      PHQ2-9    Flowsheet Row ED from 03/03/2023 in [facility] Office Visit from 11/30/2022 in [facility] Office Visit from 03/24/2021 in [facility] Office Visit from 08/23/2018 in [facility] Office Visit from 05/08/2017 in [facility]  PHQ-2 Total Score 2 0 0 0 0  PHQ-9 Total Score 3 3 -- -- --      Flowsheet Row ED from 03/03/2023 in [facility] ED from 03/01/2023 in [facility] ED from 02/05/2023 in [facility] Emergency Department at [facility]  C-SSRS RISK CATEGORY No Risk High Risk No Risk        Musculoskeletal  Strength & Muscle Tone: within normal limits Gait & Station: normal Patient leans: N/A  Psychiatric Specialty Exam  Presentation  General Appearance:  Appropriate for Environment; Casual  Eye Contact: Good  Speech: Clear and Coherent; Normal Rate  Speech Volume: Normal  Handedness: Right   Mood and Affect  Mood: Happy  Affect: Full Range Euthymic  Thought Process  Thought Processes: Coherent; Linear  Descriptions of Associations:Intact  Orientation:Full (Time, Place and Person)  Thought Content:Logical; WDL  Diagnosis of Schizophrenia or Schizoaffective disorder in past: No    Hallucinations: None Ideas of Reference:None  Suicidal Thoughts: None Homicidal Thoughts: None  Sensorium  Memory: Immediate Good; Recent Good  Judgment: Good  Insight: Good   Executive Functions  Concentration: Good  Attention Span: Good  Recall: Good  Fund of Knowledge: Good  Language: Good  Psychomotor Activity  Psychomotor Activity:No data recorded  Assets   Assets: Communication Skills; Desire for Improvement; Social Support  Sleep  Sleep: Good  No data recorded  Physical Exam  Physical Exam Vitals reviewed.  Constitutional:      General: He is not in acute distress.    Appearance: He is not toxic-appearing.  HENT:     Head: Normocephalic and atraumatic.     Mouth/Throat:     Mouth: Mucous membranes are moist.     Pharynx: Oropharynx is clear.  Pulmonary:     Effort: Pulmonary effort is normal.  Skin:    General: Skin is warm and dry.  Neurological:     General: No focal deficit present.     Mental Status: He is alert and oriented to person, place, and time.     Motor: No weakness.     Gait: Gait normal.      Review of Systems  Respiratory:  Negative for shortness of breath.   Cardiovascular:  Negative for chest pain.  Gastrointestinal:  Negative for abdominal pain, diarrhea, nausea and vomiting.  Neurological:  Negative for dizziness, tremors, seizures and headaches.   Blood pressure (!) 129/91, pulse 80, temperature 97.9 F (36.6 C), temperature source Tympanic, resp. rate 16, SpO2 100 %. There is no height or weight on file to calculate BMI.  Treatment Plan Summary: Daily contact with patient to assess and evaluate symptoms and progress in treatment Dakota Kim is a 52 year old male with no formal psychiatric diagnoses prior to admission but who meets criteria for cocaine use disorder, nicotine use disorder, and prolonged grief who presented voluntarily to [facility] for detox and substance use treatment, for which he was admitted to [facility].   Patient remains motivated for residential substance use treatment if possible given elevated A1C. He has good insight into his triggers and is committed to maintaining sobriety. He declined psychotropics for depressive sxs 2/2 to prolonged grief and would instead be open to connecting with a therapist for close follow-up post discharge. LCSW assisting with dispo planning.  #Cocaine Use  Disorder #Substance-induced mood disorder #Prolonged Grief Disorder #Nicotine Use Disorder  Continue hydroxyzine 25 mg p.o. 3 times daily as needed Increase to trazodone 100 mg p.o. nightly as needed Continue gabapentin 300 mg p.o. 3 times daily Nicotine patch 14 mg daily PRN  #T2DM -Continue glipizide 5 mg p.o. daily -Continue insulin coverage Semglee 30 units; administration adjusted to PM dosing -Moderate SSI; will consider adjusting to resistant should CBGs remain elevated. 24 hour CBGs 188-270  #Hyperlipidemia Continue Lipitor 10 mg daily Continue to monitor BP for improvement   Patient encouraged to participate within the therapeutic milieu  Dispo: Pending  Elnora Morrison, Medical Student 03/08/2023 8:13 AM    Attestation for Student Documentation:  I personally was present and performed or re-performed the history, psychiatric specialty exam and medical decision-making activities of this service and have verified that the service and findings are accurately documented in the student's note.  Lamar Sprinkles, MD 03/08/2023, 2:46 PM

## 2023-03-08 NOTE — Group Note (Signed)
Group Topic: Relapse and Recovery  Group Date: 03/08/2023 Start Time: 0800 End Time: 0900 Facilitators: Emmit Pomfret D, NT  Department: [facility]  Number of Participants: 4  Group Focus: acceptance Treatment Modality:  Psychoeducation Interventions utilized were support Purpose: relapse prevention strategies  Name: Dakota Kim Date of Birth: 1971-03-28  MR: [id number]    Level of Participation: minimal Quality of Participation: active Interactions with others: gave feedback Mood/Affect: appropriate Triggers (if applicable): n/a Cognition: concrete Progress: Significant Response: n/a Plan: follow-up needed  Patients Problems:  Patient Active Problem List   Diagnosis Date Noted   Prolonged grief disorder 03/05/2023   Polysubstance abuse (HCC) 03/03/2023   Substance induced mood disorder (HCC) 03/03/2023   Foot infection 11/30/2022   Hyperlipidemia 02/26/2019   Class 2 severe obesity due to excess calories with serious comorbidity and body mass index (BMI) of 35.0 to 35.9 in adult [facility]) 02/26/2019   Type 2 diabetes mellitus without complication, with long-term current use of insulin (HCC) 05/13/2017   Hypokalemia 11/06/2012

## 2023-03-08 NOTE — ED Notes (Signed)
Patient observed/assessed at nursing station. Patient alert and oriented x 4. Affect is bright. Patient denies pain and anxiety. He denies A/V/H. He denies having any thoughts/plan of self harm and harm towards others. Fluid and snack offered. Patient states that appetite has been good throughout the day. Last BM was 03/08/23 without issue. Verbalizes no further complaints at this time. Will continue to monitor and support.

## 2023-03-08 NOTE — Group Note (Signed)
Group Topic: Healthy Self Image and Positive Change  Group Date: 03/08/2023 Start Time: 1000 End Time: 1045 Facilitators: Vonzell Schlatter B  Department: [facility]  Number of Participants: 6  Group Focus: daily focus and goals/reality orientation Treatment Modality:  Psychoeducation Interventions utilized were problem solving and support Purpose: regain self-worth and reinforce self-care  Name: Stephane Niemann Date of Birth: Nov 03, 1970  MR: [id number]    Level of Participation: active Quality of Participation: attentive and cooperative Interactions with others: gave feedback Mood/Affect: positive Triggers (if applicable): n/a Cognition: coherent/clear Progress: Moderate Response: n/a Plan: follow-up needed  Patients Problems:  Patient Active Problem List   Diagnosis Date Noted   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

## 2023-03-08 NOTE — Discharge Planning (Signed)
LCSW spoke with patient on this morning to provide an update. Patient aware that LCSW is awaiting update from [place] regarding admission. Patient expressed understanding about knowing that his A1C has been the biggest barrier. Patient aware that LCSW will provide a list of [facility] for his follow up as he will need a secondary plan if placement is not an option. Brief supportive counseling was provided to the patient and he was receptive to the feedback provided. No other needs were reported at this time.   LCSW will continue to follow and provide support to patient while on [facility] unit.   [name], LCSW Clinical Social Worker [place] [facility] Ph: [phone]

## 2023-03-08 NOTE — Group Note (Signed)
Group Topic: Balance in Life  Group Date: 03/08/2023 Start Time: 0315 End Time: 0430 Facilitators: Lenny Pastel  Department: [facility]  Number of Participants: 6  Group Focus: clarity of thought, communication, feeling awareness/expression, problem solving, and self-awareness Treatment Modality:  Cognitive Behavioral Therapy Interventions utilized were assignment, exploration, group exercise, story telling, and support Purpose: express feelings, improve communication skills, increase insight, regain self-worth, reinforce self-care, relapse prevention strategies, and trigger / craving management  Name: Dakota Kim Date of Birth: 1970-12-21  MR: [id number]    Level of Participation: active Quality of Participation: attentive, cooperative, engaged, initiates communication Interactions with others: gave feedback Mood/Affect: appropriate Triggers (if applicable): N/A Cognition: coherent/clear, goal directed, insightful, and logical Progress: Moderate Response: Patient actively participated in group on today. Group started off with introductions and group rules. Group members participated in a therapeutic activity that required active listening and communication skills. Group members were able to identify similarities and differences within the group. Patient interacted positively with staff and peers. Brief supportive counseling was provided to the patient regarding his goals to seek further treatment and he was receptive to the feedback provided.  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

## 2023-03-08 NOTE — ED Notes (Signed)
Patient attended group and actively participated.  He demonstrates good insight and appears motivated for treatment.

## 2023-03-09 DIAGNOSIS — R45851 Suicidal ideations: Secondary | ICD-10-CM | POA: Diagnosis not present

## 2023-03-09 DIAGNOSIS — Z5900 Homelessness unspecified: Secondary | ICD-10-CM | POA: Diagnosis not present

## 2023-03-09 DIAGNOSIS — Z560 Unemployment, unspecified: Secondary | ICD-10-CM | POA: Diagnosis not present

## 2023-03-09 DIAGNOSIS — E119 Type 2 diabetes mellitus without complications: Secondary | ICD-10-CM | POA: Diagnosis not present

## 2023-03-09 DIAGNOSIS — F1410 Cocaine abuse, uncomplicated: Secondary | ICD-10-CM | POA: Diagnosis not present

## 2023-03-09 DIAGNOSIS — Z794 Long term (current) use of insulin: Secondary | ICD-10-CM | POA: Diagnosis not present

## 2023-03-09 LAB — GLUCOSE, CAPILLARY
Glucose-Capillary: 316 mg/dL — ABNORMAL HIGH (ref 70–99)
Glucose-Capillary: 207 mg/dL — ABNORMAL HIGH (ref 70–99)
Glucose-Capillary: 155 mg/dL — ABNORMAL HIGH (ref 70–99)
Glucose-Capillary: 203 mg/dL — ABNORMAL HIGH (ref 70–99)
Glucose-Capillary: 270 mg/dL — ABNORMAL HIGH (ref 70–99)

## 2023-03-09 NOTE — Progress Notes (Signed)
Pt is awake, alert and oriented X4. Pt did not voice any complaints of pain or discomfort. No signs of acute distress noted. Administered scheduled meds with no issue. Pt denies current SI/HI/AVH, plan or intent. Staff will monitor for pt's safety. 

## 2023-03-09 NOTE — Progress Notes (Deleted)
Pt is awake, alert and oriented X4.  Pt complained of generalized body pain. No signs of acute distress noted. PRN Ibuprofen and scheduled meds administered with no issue. Pt was agitated this AM and seems upset about her discharge plan. Pt was observed in the dayroom interacting with peers using foul language. Pt was redirected and was receptive. Pt denies current SI/HI/AVH, plan or intent. Staff will monitor for pt's safety

## 2023-03-09 NOTE — ED Notes (Signed)
Pt was given dinner 

## 2023-03-09 NOTE — ED Notes (Addendum)
Pt is in the Dayroom watching TV. Respirations are even and unlabored. No acute distress noted. Will continue to monitor for safety.  

## 2023-03-09 NOTE — [facility] Group Notes (Signed)
SPIRITUALITY GROUP NOTE  Spirituality group facilitated by Chaplain Chabeli Barsamian, MDiv, BCC.  Group Description: Group focused on topic of hope. Patients participated in facilitated discussion around topic, connecting with one another around experiences and definitions for hope. Group members engaged with visual explorer photos, reflecting on what hope looks like for them today. Group engaged in discussion around how their definitions of hope are present today in hospital.  Modalities: Psycho-social ed, Adlerian, Narrative, MI  Patient Progress: Present throughout group.  Actively engaged in group discussion.  

## 2023-03-09 NOTE — Discharge Planning (Signed)
LCSW received update from [place] at [facility]. Per Enrique Sack, patient has been accepted and can admit into their program on March 13, 2023 by 10:00am. Facility aware that [facility] will work on getting supply of medications for patient and is aware that 14 days may be the most we can provide. Facility can provide needles for insulin as needed. Patient would just need the medication. No other needs were reported by the agency. Update was provided to the MD and team. LCSW will speak with patient after group.   [name], LCSW Clinical Social Worker [place] [facility] Ph: [phone]

## 2023-03-09 NOTE — Progress Notes (Signed)
Pt is in dayroom interacting with peer. No distress noted or concerns voiced. Staff will monitor for pt's safety. 

## 2023-03-09 NOTE — ED Notes (Signed)
Pt was given breakfast

## 2023-03-09 NOTE — ED Notes (Signed)
Pt is in the bed sleeping. Respirations are even and unlabored. No acute distress noted. Will continue to monitor for safety. 

## 2023-03-09 NOTE — ED Provider Notes (Signed)
Behavioral Health Progress Note  Date and Time: 03/09/2023 1:52 PM Name: Dakota Kim MRN:  [id number]  Subjective:  Dakota Kim is a 52 year old male with no formal psychiatric diagnoses prior to admission but who meets criteria for cocaine use disorder, nicotine use disorder, and prolonged grief who presented voluntarily to [facility] for detox and substance use treatment, for which he was admitted to [facility].  On assessment today, patient is doing well. He again slept well with increase in Trazodone. Patient denies vivid dreams last night. His appetite also remains intact.  He remains motivated for treatment at a residential facility and has been accepted to [facility] early next week, of which he is excited. Blood glucose continues to fluctuate, but Semglee will be administered nightly to mitigate AM hyperglycemia.  Patient's blood pressure has improved since yesterday to 122/70. He denies any somatic concerns and complaints. He denies SI, HI, AVH.   Diagnosis:  Final diagnoses:  Substance induced mood disorder (HCC)  Polysubstance abuse (HCC)  Prolonged grief disorder    Total Time spent with patient: 30 minutes  Past Psychiatric History: See HPI Past Medical History: See HPI Family History: See HPI Family Psychiatric  History: See HPI Social History: See HPI  Additional Social History:                         Sleep: Good  Appetite:  Good  Current Medications:  Current Facility-Administered Medications  Medication Dose Route Frequency Provider Last Rate Last Admin   acetaminophen (TYLENOL) tablet 650 mg  650 mg Oral Q6H PRN Rankin, Shuvon B, NP   650 mg at 03/06/23 2033   alum & mag hydroxide-simeth (MAALOX/MYLANTA) 200-200-20 MG/5ML suspension 30 mL  30 mL Oral Q4H PRN Rankin, Shuvon B, NP       atorvastatin (LIPITOR) tablet 10 mg  10 mg Oral Daily Rankin, Shuvon B, NP   10 mg at 03/09/23 0832   gabapentin (NEURONTIN) capsule 300 mg  300 mg Oral BID Rankin, Shuvon B, NP    300 mg at 03/09/23 0832   glipiZIDE (GLUCOTROL) tablet 5 mg  5 mg Oral QAC breakfast Rankin, Shuvon B, NP   5 mg at 03/09/23 0832   insulin aspart (novoLOG) injection 0-15 Units  0-15 Units Subcutaneous TID WC Rankin, Shuvon B, NP   3 Units at 03/09/23 1226   insulin aspart (novoLOG) injection 0-5 Units  0-5 Units Subcutaneous QHS Rankin, Shuvon B, NP   4 Units at 03/08/23 2109   insulin glargine-yfgn (SEMGLEE) injection 30 Units  30 Units Subcutaneous QHS Lamar Sprinkles, MD       nicotine (NICODERM CQ - dosed in mg/24 hours) patch 14 mg  14 mg Transdermal Daily PRN Lamar Sprinkles, MD       polyethylene glycol (MIRALAX / GLYCOLAX) packet 17 g  17 g Oral Daily PRN Ajibola, Ene A, NP   17 g at 03/03/23 2138   traZODone (DESYREL) tablet 100-150 mg  100-150 mg Oral QHS PRN Lamar Sprinkles, MD   100 mg at 03/08/23 2106   Current Outpatient Medications  Medication Sig Dispense Refill   atorvastatin (LIPITOR) 10 MG tablet TAKE 1 TABLET (10 MG TOTAL) BY MOUTH DAILY. (Patient not taking: Reported on 03/02/2023) 30 tablet 11   gabapentin (NEURONTIN) 300 MG capsule Take 1 capsule (300 mg total) by mouth 2 (two) times daily. (Patient not taking: Reported on 03/02/2023) 60 capsule 2   glipiZIDE (GLUCOTROL) 5 MG tablet Take 1 tablet (5  mg total) by mouth daily before breakfast. (Patient not taking: Reported on 03/02/2023) 60 tablet 0   hydrOXYzine (ATARAX) 25 MG tablet Take 1 tablet (25 mg total) by mouth 3 (three) times daily as needed for anxiety. 30 tablet 0   insulin aspart (NOVOLOG) 100 UNIT/ML injection Inject 10 Units into the skin 3 (three) times daily with meals. 10 mL 11   insulin aspart (NOVOLOG) 100 UNIT/ML injection Inject 0-15 Units into the skin 3 (three) times daily with meals. 10 mL 11   insulin aspart (NOVOLOG) 100 UNIT/ML injection Inject 0-5 Units into the skin at bedtime. 10 mL 11   Insulin Glargine (BASAGLAR KWIKPEN) 100 UNIT/ML Inject 15 Units into the skin at bedtime. (Patient taking  differently: Inject 20 Units into the skin at bedtime.) 15 mL 1   insulin glargine-yfgn (SEMGLEE) 100 UNIT/ML injection Inject 0.2 mLs (20 Units total) into the skin daily. 10 mL 11   traZODone (DESYREL) 50 MG tablet Take 1 tablet (50 mg total) by mouth at bedtime as needed for sleep.      Labs  Lab Results:  Admission on 03/03/2023  Component Date Value Ref Range Status   Glucose-Capillary 03/03/2023 287 (H)  70 - 99 mg/dL Final   Glucose reference range applies only to samples taken after fasting for at least 8 hours.   Glucose-Capillary 03/04/2023 252 (H)  70 - 99 mg/dL Final   Glucose reference range applies only to samples taken after fasting for at least 8 hours.   Glucose-Capillary 03/04/2023 341 (H)  70 - 99 mg/dL Final   Glucose reference range applies only to samples taken after fasting for at least 8 hours.   Glucose-Capillary 03/04/2023 286 (H)  70 - 99 mg/dL Final   Glucose reference range applies only to samples taken after fasting for at least 8 hours.   Glucose-Capillary 03/03/2023 175 (H)  70 - 99 mg/dL Final   Glucose reference range applies only to samples taken after fasting for at least 8 hours.   Glucose-Capillary 03/04/2023 182 (H)  70 - 99 mg/dL Final   Glucose reference range applies only to samples taken after fasting for at least 8 hours.   Glucose-Capillary 03/05/2023 254 (H)  70 - 99 mg/dL Final   Glucose reference range applies only to samples taken after fasting for at least 8 hours.   Glucose-Capillary 03/05/2023 163 (H)  70 - 99 mg/dL Final   Glucose reference range applies only to samples taken after fasting for at least 8 hours.   Glucose-Capillary 03/05/2023 242 (H)  70 - 99 mg/dL Final   Glucose reference range applies only to samples taken after fasting for at least 8 hours.   Glucose-Capillary 03/05/2023 387 (H)  70 - 99 mg/dL Final   Glucose reference range applies only to samples taken after fasting for at least 8 hours.   Glucose-Capillary  03/06/2023 232 (H)  70 - 99 mg/dL Final   Glucose reference range applies only to samples taken after fasting for at least 8 hours.   Glucose-Capillary 03/06/2023 231 (H)  70 - 99 mg/dL Final   Glucose reference range applies only to samples taken after fasting for at least 8 hours.   Glucose-Capillary 03/06/2023 296 (H)  70 - 99 mg/dL Final   Glucose reference range applies only to samples taken after fasting for at least 8 hours.   Glucose-Capillary 03/06/2023 257 (H)  70 - 99 mg/dL Final   Glucose reference range applies only to samples taken  after fasting for at least 8 hours.   Glucose-Capillary 03/07/2023 269 (H)  70 - 99 mg/dL Final   Glucose reference range applies only to samples taken after fasting for at least 8 hours.   Glucose-Capillary 03/06/2023 250 (H)  70 - 99 mg/dL Final   Glucose reference range applies only to samples taken after fasting for at least 8 hours.   Glucose-Capillary 03/07/2023 256 (H)  70 - 99 mg/dL Final   Glucose reference range applies only to samples taken after fasting for at least 8 hours.   Glucose-Capillary 03/07/2023 237 (H)  70 - 99 mg/dL Final   Glucose reference range applies only to samples taken after fasting for at least 8 hours.   Glucose-Capillary 03/07/2023 270 (H)  70 - 99 mg/dL Final   Glucose reference range applies only to samples taken after fasting for at least 8 hours.   Glucose-Capillary 03/08/2023 230 (H)  70 - 99 mg/dL Final   Glucose reference range applies only to samples taken after fasting for at least 8 hours.   Glucose-Capillary 03/08/2023 188 (H)  70 - 99 mg/dL Final   Glucose reference range applies only to samples taken after fasting for at least 8 hours.   Glucose-Capillary 03/08/2023 129 (H)  70 - 99 mg/dL Final   Glucose reference range applies only to samples taken after fasting for at least 8 hours.   Glucose-Capillary 03/08/2023 316 (H)  70 - 99 mg/dL Final   Glucose reference range applies only to samples taken  after fasting for at least 8 hours.   Glucose-Capillary 03/09/2023 207 (H)  70 - 99 mg/dL Final   Glucose reference range applies only to samples taken after fasting for at least 8 hours.   Glucose-Capillary 03/09/2023 155 (H)  70 - 99 mg/dL Final   Glucose reference range applies only to samples taken after fasting for at least 8 hours.  Admission on 03/01/2023, Discharged on 03/03/2023  Component Date Value Ref Range Status   WBC 03/02/2023 5.7  4.0 - 10.5 K/uL Final   RBC 03/02/2023 4.71  4.22 - 5.81 MIL/uL Final   Hemoglobin 03/02/2023 13.7  13.0 - 17.0 g/dL Final   HCT 16/06/9603 43.7  39.0 - 52.0 % Final   MCV 03/02/2023 92.8  80.0 - 100.0 fL Final   MCH 03/02/2023 29.1  26.0 - 34.0 pg Final   MCHC 03/02/2023 31.4  30.0 - 36.0 g/dL Final   RDW 54/05/8118 14.1  11.5 - 15.5 % Final   Platelets 03/02/2023 256  150 - 400 K/uL Final   nRBC 03/02/2023 0.0  0.0 - 0.2 % Final   Neutrophils Relative % 03/02/2023 53  % Final   Neutro Abs 03/02/2023 3.1  1.7 - 7.7 K/uL Final   Lymphocytes Relative 03/02/2023 32  % Final   Lymphs Abs 03/02/2023 1.8  0.7 - 4.0 K/uL Final   Monocytes Relative 03/02/2023 11  % Final   Monocytes Absolute 03/02/2023 0.6  0.1 - 1.0 K/uL Final   Eosinophils Relative 03/02/2023 2  % Final   Eosinophils Absolute 03/02/2023 0.1  0.0 - 0.5 K/uL Final   Basophils Relative 03/02/2023 1  % Final   Basophils Absolute 03/02/2023 0.0  0.0 - 0.1 K/uL Final   Immature Granulocytes 03/02/2023 1  % Final   Abs Immature Granulocytes 03/02/2023 0.04  0.00 - 0.07 K/uL Final   Performed at [facility], [address] [place], [place] [address]   Sodium 03/02/2023 137  135 - 145 mmol/L Final   Potassium 03/02/2023 5.0  3.5 - 5.1 mmol/L Final   Chloride 03/02/2023 100  98 - 111 mmol/L Final   CO2 03/02/2023 27  22 - 32 mmol/L Final   Glucose, Bld 03/02/2023 424 (H)  70 - 99 mg/dL Final   Glucose reference range applies only to samples taken after fasting for at least 8  hours.   BUN 03/02/2023 14  6 - 20 mg/dL Final   Creatinine, Ser 03/02/2023 0.90  0.61 - 1.24 mg/dL Final   Calcium 16/06/9603 9.3  8.9 - 10.3 mg/dL Final   Total Protein 54/05/8118 6.5  6.5 - 8.1 g/dL Final   Albumin 14/78/2956 3.6  3.5 - 5.0 g/dL Final   AST 21/30/8657 13 (L)  15 - 41 U/L Final   ALT 03/02/2023 16  0 - 44 U/L Final   Alkaline Phosphatase 03/02/2023 65  38 - 126 U/L Final   Total Bilirubin 03/02/2023 0.5  0.3 - 1.2 mg/dL Final   GFR, Estimated 03/02/2023 >60  >60 mL/min Final   Comment: (NOTE) Calculated using the CKD-EPI Creatinine Equation (2021)    Anion gap 03/02/2023 10  5 - 15 Final   Performed at [facility], [address], [place], [place] [address]   Hgb A1c MFr Bld 03/02/2023 10.9 (H)  4.8 - 5.6 % Final   Comment: (NOTE) Pre diabetes:          5.7%-6.4%  Diabetes:              >6.4%  Glycemic control for   <7.0% adults with diabetes    Mean Plasma Glucose 03/02/2023 266.13  mg/dL Final   Performed at [facility], [address], [place], [place] [address]   Alcohol, Ethyl (B) 03/02/2023 <10  <10 mg/dL Final   Comment: (NOTE) Lowest detectable limit for serum alcohol is 10 mg/dL.  For medical purposes only. Performed at [facility], [address] [place], [place] [address]    TSH 03/02/2023 1.178  0.350 - 4.500 uIU/mL Final   Comment: Performed by a 3rd Generation assay with a functional sensitivity of <=0.01 uIU/mL. Performed at [facility], [address], [place], [place] [address]    POC Amphetamine UR 03/02/2023 None Detected  NONE DETECTED (Cut Off Level 1000 ng/mL) Final   POC Secobarbital (BAR) 03/02/2023 None Detected  NONE DETECTED (Cut Off Level 300 ng/mL) Final   POC Buprenorphine (BUP) 03/02/2023 None Detected  NONE DETECTED (Cut Off Level 10 ng/mL) Final   POC Oxazepam (BZO) 03/02/2023 None Detected  NONE DETECTED (Cut Off Level 300 ng/mL) Final   POC Cocaine UR 03/02/2023 Positive (A)  NONE DETECTED  (Cut Off Level 300 ng/mL) Final   POC Methamphetamine UR 03/02/2023 None Detected  NONE DETECTED (Cut Off Level 1000 ng/mL) Final   POC Morphine 03/02/2023 None Detected  NONE DETECTED (Cut Off Level 300 ng/mL) Final   POC Methadone UR 03/02/2023 None Detected  NONE DETECTED (Cut Off Level 300 ng/mL) Final   POC Oxycodone UR 03/02/2023 None Detected  NONE DETECTED (Cut Off Level 100 ng/mL) Final   POC Marijuana UR 03/02/2023 Positive (A)  NONE DETECTED (Cut Off Level 50 ng/mL) Final   Glucose-Capillary 03/02/2023 499 (H)  70 - 99 mg/dL Final   Glucose reference range applies only to samples taken after fasting for at least 8 hours.   Glucose-Capillary 03/02/2023 404 (H)  70 - 99 mg/dL Final  Glucose reference range applies only to samples taken after fasting for at least 8 hours.   Glucose-Capillary 03/02/2023 214 (H)  70 - 99 mg/dL Final   Glucose reference range applies only to samples taken after fasting for at least 8 hours.   Glucose-Capillary 03/02/2023 265 (H)  70 - 99 mg/dL Final   Glucose reference range applies only to samples taken after fasting for at least 8 hours.   Glucose-Capillary 03/03/2023 263 (H)  70 - 99 mg/dL Final   Glucose reference range applies only to samples taken after fasting for at least 8 hours.   Glucose-Capillary 03/03/2023 258 (H)  70 - 99 mg/dL Final   Glucose reference range applies only to samples taken after fasting for at least 8 hours.  Admission on 02/05/2023, Discharged on 02/05/2023  Component Date Value Ref Range Status   Glucose-Capillary 02/05/2023 380 (H)  70 - 99 mg/dL Final   Glucose reference range applies only to samples taken after fasting for at least 8 hours.   Sodium 02/05/2023 138  135 - 145 mmol/L Final   Potassium 02/05/2023 4.0  3.5 - 5.1 mmol/L Final   Chloride 02/05/2023 103  98 - 111 mmol/L Final   CO2 02/05/2023 26  22 - 32 mmol/L Final   Glucose, Bld 02/05/2023 380 (H)  70 - 99 mg/dL Final   Glucose reference range applies  only to samples taken after fasting for at least 8 hours.   BUN 02/05/2023 11  6 - 20 mg/dL Final   Creatinine, Ser 02/05/2023 0.92  0.61 - 1.24 mg/dL Final   Calcium 16/06/9603 9.1  8.9 - 10.3 mg/dL Final   GFR, Estimated 02/05/2023 >60  >60 mL/min Final   Comment: (NOTE) Calculated using the CKD-EPI Creatinine Equation (2021)    Anion gap 02/05/2023 9  5 - 15 Final   Performed at [facility], [address], [place], [place] [address]   WBC 02/05/2023 7.2  4.0 - 10.5 K/uL Final   RBC 02/05/2023 5.40  4.22 - 5.81 MIL/uL Final   Hemoglobin 02/05/2023 16.0  13.0 - 17.0 g/dL Final   HCT 11/91/4782 49.7  39.0 - 52.0 % Final   MCV 02/05/2023 92.0  80.0 - 100.0 fL Final   MCH 02/05/2023 29.6  26.0 - 34.0 pg Final   MCHC 02/05/2023 32.2  30.0 - 36.0 g/dL Final   RDW 95/62/1308 13.9  11.5 - 15.5 % Final   Platelets 02/05/2023 266  150 - 400 K/uL Final   nRBC 02/05/2023 0.0  0.0 - 0.2 % Final   Performed at [facility], [address], [place], [place] [address]   Glucose-Capillary 02/05/2023 342 (H)  70 - 99 mg/dL Final   Glucose reference range applies only to samples taken after fasting for at least 8 hours.   Total Protein 02/05/2023 7.5  6.5 - 8.1 g/dL Final   Albumin 69/62/9528 4.1  3.5 - 5.0 g/dL Final   AST 41/32/4401 16  15 - 41 U/L Final   ALT 02/05/2023 12  0 - 44 U/L Final   Alkaline Phosphatase 02/05/2023 83  38 - 126 U/L Final   Total Bilirubin 02/05/2023 1.2  0.3 - 1.2 mg/dL Final   Bilirubin, Direct 02/05/2023 0.3 (H)  0.0 - 0.2 mg/dL Final   Indirect Bilirubin 02/05/2023 0.9  0.3 - 0.9 mg/dL Final   Performed at [facility], [address] [place], [place] [address]   Lipase 02/05/2023 36  11 - 51 U/L Final   Performed at [facility], [address], [place], [place] [address]   pH, Ven 02/05/2023 7.352  7.25 - 7.43 Final   pCO2, Ven 02/05/2023 48.6  44 - 60 mmHg Final   pO2, Ven 02/05/2023 28 (LL)  32 - 45 mmHg Final   Bicarbonate 02/05/2023  26.9  20.0 - 28.0 mmol/L Final   TCO2 02/05/2023 28  22 - 32 mmol/L Final   O2 Saturation 02/05/2023 49  % Final   Acid-Base Excess 02/05/2023 0.0  0.0 - 2.0 mmol/L Final   Sodium 02/05/2023 140  135 - 145 mmol/L Final   Potassium 02/05/2023 3.9  3.5 - 5.1 mmol/L Final   Calcium, Ion 02/05/2023 1.22  1.15 - 1.40 mmol/L Final   HCT 02/05/2023 50.0  39.0 - 52.0 % Final   Hemoglobin 02/05/2023 17.0  13.0 - 17.0 g/dL Final   Sample type 60/45/4098 VENOUS   Final   Comment 02/05/2023 NOTIFIED PHYSICIAN   Final   Beta-Hydroxybutyric Acid 02/05/2023 0.21  0.05 - 0.27 mmol/L Final   Performed at [facility], [address], [place], [place] [address]   Glucose-Capillary 02/05/2023 213 (H)  70 - 99 mg/dL Final   Glucose reference range applies only to samples taken after fasting for at least 8 hours.  Admission on 01/27/2023, Discharged on 01/27/2023  Component Date Value Ref Range Status   Glucose-Capillary 01/27/2023 454 (H)  70 - 99 mg/dL Final   Glucose reference range applies only to samples taken after fasting for at least 8 hours.   WBC 01/27/2023 5.3  4.0 - 10.5 K/uL Final   RBC 01/27/2023 4.77  4.22 - 5.81 MIL/uL Final   Hemoglobin 01/27/2023 14.2  13.0 - 17.0 g/dL Final   HCT 78/29/5621 43.5  39.0 - 52.0 % Final   MCV 01/27/2023 91.2  80.0 - 100.0 fL Final   MCH 01/27/2023 29.8  26.0 - 34.0 pg Final   MCHC 01/27/2023 32.6  30.0 - 36.0 g/dL Final   RDW 30/86/5784 13.9  11.5 - 15.5 % Final   Platelets 01/27/2023 238  150 - 400 K/uL Final   nRBC 01/27/2023 0.0  0.0 - 0.2 % Final   Neutrophils Relative % 01/27/2023 68  % Final   Neutro Abs 01/27/2023 3.5  1.7 - 7.7 K/uL Final   Lymphocytes Relative 01/27/2023 23  % Final   Lymphs Abs 01/27/2023 1.2  0.7 - 4.0 K/uL Final   Monocytes Relative 01/27/2023 9  % Final   Monocytes Absolute 01/27/2023 0.5  0.1 - 1.0 K/uL Final   Eosinophils Relative 01/27/2023 0  % Final   Eosinophils Absolute 01/27/2023 0.0  0.0 - 0.5 K/uL Final    Basophils Relative 01/27/2023 0  % Final   Basophils Absolute 01/27/2023 0.0  0.0 - 0.1 K/uL Final   Immature Granulocytes 01/27/2023 0  % Final   Abs Immature Granulocytes 01/27/2023 0.02  0.00 - 0.07 K/uL Final   Performed at [facility], [address], [place], [place] [address]   Sodium 01/27/2023 133 (L)  135 - 145 mmol/L Final   Potassium 01/27/2023 4.5  3.5 - 5.1 mmol/L Final   Chloride 01/27/2023 99  98 - 111 mmol/L Final   CO2 01/27/2023 25  22 - 32 mmol/L Final   Glucose, Bld 01/27/2023 410 (H)  70 - 99 mg/dL Final   Glucose reference range applies only to samples taken after fasting for at least  8 hours.   BUN 01/27/2023 17  6 - 20 mg/dL Final   Creatinine, Ser 01/27/2023 0.89  0.61 - 1.24 mg/dL Final   Calcium 69/62/9528 8.5 (L)  8.9 - 10.3 mg/dL Final   Total Protein 41/32/4401 6.8  6.5 - 8.1 g/dL Final   Albumin 02/72/5366 3.6  3.5 - 5.0 g/dL Final   AST 44/11/4740 19  15 - 41 U/L Final   ALT 01/27/2023 15  0 - 44 U/L Final   Alkaline Phosphatase 01/27/2023 60  38 - 126 U/L Final   Total Bilirubin 01/27/2023 0.7  0.3 - 1.2 mg/dL Final   GFR, Estimated 01/27/2023 >60  >60 mL/min Final   Comment: (NOTE) Calculated using the CKD-EPI Creatinine Equation (2021)    Anion gap 01/27/2023 9  5 - 15 Final   Performed at [facility], [address] [place], [place] [address]   Glucose-Capillary 01/27/2023 339 (H)  70 - 99 mg/dL Final   Glucose reference range applies only to samples taken after fasting for at least 8 hours.  Admission on 12/23/2022, Discharged on 12/23/2022  Component Date Value Ref Range Status   Glucose-Capillary 12/23/2022 339 (H)  70 - 99 mg/dL Final   Glucose reference range applies only to samples taken after fasting for at least 8 hours.   Comment 1 12/23/2022 Notify RN   Final   Sodium 12/23/2022 132 (L)  135 - 145 mmol/L Final   Potassium 12/23/2022 4.0  3.5 - 5.1 mmol/L Final   Chloride 12/23/2022 102  98 - 111  mmol/L Final   CO2 12/23/2022 26  22 - 32 mmol/L Final   Glucose, Bld 12/23/2022 351 (H)  70 - 99 mg/dL Final   Glucose reference range applies only to samples taken after fasting for at least 8 hours.   BUN 12/23/2022 13  6 - 20 mg/dL Final   Creatinine, Ser 12/23/2022 0.70  0.61 - 1.24 mg/dL Final   Calcium 87/56/4332 8.3 (L)  8.9 - 10.3 mg/dL Final   GFR, Estimated 12/23/2022 >60  >60 mL/min Final   Comment: (NOTE) Calculated using the CKD-EPI Creatinine Equation (2021)    Anion gap 12/23/2022 4 (L)  5 - 15 Final   Performed at [facility], [address] [place], [place] [address]   WBC 12/23/2022 6.9  4.0 - 10.5 K/uL Final   RBC 12/23/2022 4.60  4.22 - 5.81 MIL/uL Final   Hemoglobin 12/23/2022 13.6  13.0 - 17.0 g/dL Final   HCT 41/66/0630 42.7  39.0 - 52.0 % Final   MCV 12/23/2022 92.8  80.0 - 100.0 fL Final   MCH 12/23/2022 29.6  26.0 - 34.0 pg Final   MCHC 12/23/2022 31.9  30.0 - 36.0 g/dL Final   RDW 16/09/930 13.4  11.5 - 15.5 % Final   Platelets 12/23/2022 268  150 - 400 K/uL Final   nRBC 12/23/2022 0.0  0.0 - 0.2 % Final   Performed at [facility], [address], [place], [place] [address]   Color, Urine 12/23/2022 YELLOW  YELLOW Final   APPearance 12/23/2022 CLEAR  CLEAR Final   Specific Gravity, Urine 12/23/2022 1.026  1.005 - 1.030 Final   pH 12/23/2022 7.0  5.0 - 8.0 Final   Glucose, UA 12/23/2022 >=500 (A)  NEGATIVE mg/dL Final   Hgb urine dipstick 12/23/2022 NEGATIVE  NEGATIVE Final   Bilirubin Urine 12/23/2022 NEGATIVE  NEGATIVE Final   Ketones, ur 12/23/2022 NEGATIVE  NEGATIVE mg/dL Final  Protein, ur 12/23/2022 NEGATIVE  NEGATIVE mg/dL Final   Nitrite 78/29/5621 NEGATIVE  NEGATIVE Final   Leukocytes,Ua 12/23/2022 NEGATIVE  NEGATIVE Final   RBC / HPF 12/23/2022 0-5  0 - 5 RBC/hpf Final   WBC, UA 12/23/2022 0-5  0 - 5 WBC/hpf Final   Bacteria, UA 12/23/2022 NONE SEEN  NONE SEEN Final   Squamous Epithelial / HPF  12/23/2022 0-5  0 - 5 /HPF Final   Performed at [facility] [address], [place], [place] [address]   Glucose-Capillary 12/23/2022 295 (H)  70 - 99 mg/dL Final   Glucose reference range applies only to samples taken after fasting for at least 8 hours.   Glucose-Capillary 12/23/2022 231 (H)  70 - 99 mg/dL Final   Glucose reference range applies only to samples taken after fasting for at least 8 hours.  Office Visit on 11/30/2022  Component Date Value Ref Range Status   Creatinine, Urine 11/30/2022 108.2  Not Estab. mg/dL Final   Microalbumin, Urine 11/30/2022 12.1  Not Estab. ug/mL Final   Microalb/Creat Ratio 11/30/2022 11  0 - 29 mg/g creat Final   Comment:                        Normal:                0 -  29                        Moderately increased: 30 - 300                        Severely increased:       >300    Glucose 11/30/2022 49 (L)  70 - 99 mg/dL Final   BUN 78/46/9629 11  6 - 24 mg/dL Final   Creatinine, Ser 11/30/2022 0.77  0.76 - 1.27 mg/dL Final   eGFR 52/84/1324 108  >59 mL/min/1.73 Final   BUN/Creatinine Ratio 11/30/2022 14  9 - 20 Final   Sodium 11/30/2022 144  134 - 144 mmol/L Final   Potassium 11/30/2022 5.2  3.5 - 5.2 mmol/L Final   Chloride 11/30/2022 104  96 - 106 mmol/L Final   CO2 11/30/2022 26  20 - 29 mmol/L Final   Calcium 11/30/2022 9.3  8.7 - 10.2 mg/dL Final   Total Protein 40/06/2724 7.0  6.0 - 8.5 g/dL Final   Albumin 36/64/4034 4.2  3.8 - 4.9 g/dL Final   Globulin, Total 11/30/2022 2.8  1.5 - 4.5 g/dL Final   Albumin/Globulin Ratio 11/30/2022 1.5  1.2 - 2.2 Final   Bilirubin Total 11/30/2022 0.2  0.0 - 1.2 mg/dL Final   Alkaline Phosphatase 11/30/2022 66  44 - 121 IU/L Final   AST 11/30/2022 16  0 - 40 IU/L Final   ALT 11/30/2022 13  0 - 44 IU/L Final   Hemoglobin A1C 11/30/2022 9.0 (A)  4.0 - 5.6 % Final   POC Glucose 11/30/2022 60 (A)  70 - 99 mg/dl Final   WBC 74/25/9563 7.7  3.4 - 10.8 x10E3/uL Final   RBC 11/30/2022  4.94  4.14 - 5.80 x10E6/uL Final   Hemoglobin 11/30/2022 14.6  13.0 - 17.7 g/dL Final   Hematocrit 87/56/4332 45.2  37.5 - 51.0 % Final   MCV 11/30/2022 92  79 - 97 fL Final   MCH 11/30/2022 29.6  26.6 - 33.0 pg Final   MCHC  11/30/2022 32.3  31.5 - 35.7 g/dL Final   RDW 16/06/9603 12.7  11.6 - 15.4 % Final   Platelets 11/30/2022 269  150 - 450 x10E3/uL Final   Neutrophils 11/30/2022 61  Not Estab. % Final   Lymphs 11/30/2022 28  Not Estab. % Final   Monocytes 11/30/2022 10  Not Estab. % Final   Eos 11/30/2022 1  Not Estab. % Final   Basos 11/30/2022 0  Not Estab. % Final   Neutrophils Absolute 11/30/2022 4.6  1.4 - 7.0 x10E3/uL Final   Lymphocytes Absolute 11/30/2022 2.2  0.7 - 3.1 x10E3/uL Final   Monocytes Absolute 11/30/2022 0.8  0.1 - 0.9 x10E3/uL Final   EOS (ABSOLUTE) 11/30/2022 0.1  0.0 - 0.4 x10E3/uL Final   Basophils Absolute 11/30/2022 0.0  0.0 - 0.2 x10E3/uL Final   Immature Granulocytes 11/30/2022 0  Not Estab. % Final   Immature Grans (Abs) 11/30/2022 0.0  0.0 - 0.1 x10E3/uL Final   Cholesterol, Total 11/30/2022 112  100 - 199 mg/dL Final   Triglycerides 54/05/8118 82  0 - 149 mg/dL Final   HDL 14/78/2956 52  >39 mg/dL Final   VLDL Cholesterol Cal 11/30/2022 16  5 - 40 mg/dL Final   LDL Chol Calc (NIH) 11/30/2022 44  0 - 99 mg/dL Final   Chol/HDL Ratio 11/30/2022 2.2  0.0 - 5.0 ratio Final   Comment:                                   T. Chol/HDL Ratio                                             Men  Women                               1/2 Avg.Risk  3.4    3.3                                   Avg.Risk  5.0    4.4                                2X Avg.Risk  9.6    7.1                                3X Avg.Risk 23.4   11.0   Admission on 10/28/2022, Discharged on 10/28/2022  Component Date Value Ref Range Status   WBC 10/28/2022 5.9  4.0 - 10.5 K/uL Final   RBC 10/28/2022 4.74  4.22 - 5.81 MIL/uL Final   Hemoglobin 10/28/2022 14.2  13.0 - 17.0 g/dL Final   HCT  21/30/8657 44.3  39.0 - 52.0 % Final   MCV 10/28/2022 93.5  80.0 - 100.0 fL Final   MCH 10/28/2022 30.0  26.0 - 34.0 pg Final   MCHC 10/28/2022 32.1  30.0 - 36.0 g/dL Final   RDW 84/69/6295 13.9  11.5 - 15.5 % Final   Platelets 10/28/2022 244  150 - 400 K/uL Final   nRBC 10/28/2022 0.0  0.0 -  0.2 % Final   Neutrophils Relative % 10/28/2022 72  % Final   Neutro Abs 10/28/2022 4.3  1.7 - 7.7 K/uL Final   Lymphocytes Relative 10/28/2022 19  % Final   Lymphs Abs 10/28/2022 1.1  0.7 - 4.0 K/uL Final   Monocytes Relative 10/28/2022 8  % Final   Monocytes Absolute 10/28/2022 0.5  0.1 - 1.0 K/uL Final   Eosinophils Relative 10/28/2022 1  % Final   Eosinophils Absolute 10/28/2022 0.0  0.0 - 0.5 K/uL Final   Basophils Relative 10/28/2022 0  % Final   Basophils Absolute 10/28/2022 0.0  0.0 - 0.1 K/uL Final   Immature Granulocytes 10/28/2022 0  % Final   Abs Immature Granulocytes 10/28/2022 0.01  0.00 - 0.07 K/uL Final   Performed at [facility], [address], [place], [place] [address]   Lipase 10/28/2022 137 (H)  11 - 51 U/L Final   Performed at [facility], [address], [place], [place] [address]   Opiates 10/28/2022 NONE DETECTED  NONE DETECTED Final   Cocaine 10/28/2022 POSITIVE (A)  NONE DETECTED Final   Benzodiazepines 10/28/2022 NONE DETECTED  NONE DETECTED Final   Amphetamines 10/28/2022 NONE DETECTED  NONE DETECTED Final   Tetrahydrocannabinol 10/28/2022 NONE DETECTED  NONE DETECTED Final   Barbiturates 10/28/2022 NONE DETECTED  NONE DETECTED Final   Comment: (NOTE) DRUG SCREEN FOR MEDICAL PURPOSES ONLY.  IF CONFIRMATION IS NEEDED FOR ANY PURPOSE, NOTIFY LAB WITHIN 5 DAYS.  LOWEST DETECTABLE LIMITS FOR URINE DRUG SCREEN Drug Class                     Cutoff (ng/mL) Amphetamine and metabolites    1000 Barbiturate and metabolites    200 Benzodiazepine                 200 Opiates and metabolites        300 Cocaine and metabolites        300 THC                             50 Performed at [facility], [address] [place], [place] [address]    Alcohol, Ethyl (B) 10/28/2022 <10  <10 mg/dL Final   Comment: (NOTE) Lowest detectable limit for serum alcohol is 10 mg/dL.  For medical purposes only. Performed at [facility], [address], [place], [place] [address]    Sodium 10/28/2022 135  135 - 145 mmol/L Final   Potassium 10/28/2022 4.0  3.5 - 5.1 mmol/L Final   Chloride 10/28/2022 99  98 - 111 mmol/L Final   CO2 10/28/2022 23  22 - 32 mmol/L Final   Glucose, Bld 10/28/2022 273 (H)  70 - 99 mg/dL Final   Glucose reference range applies only to samples taken after fasting for at least 8 hours.   BUN 10/28/2022 7  6 - 20 mg/dL Final   Creatinine, Ser 10/28/2022 0.69  0.61 - 1.24 mg/dL Final   Calcium 29/56/2130 9.0  8.9 - 10.3 mg/dL Final   Total Protein 86/57/8469 7.2  6.5 - 8.1 g/dL Final   Albumin 62/95/2841 3.8  3.5 - 5.0 g/dL Final   AST 32/44/0102 22  15 - 41 U/L Final   ALT 10/28/2022 17  0 - 44 U/L Final   Alkaline Phosphatase 10/28/2022 65  38 - 126 U/L Final   Total Bilirubin 10/28/2022 0.3  0.3 - 1.2 mg/dL Final   GFR, Estimated 10/28/2022 >60  >60 mL/min Final   Comment: (NOTE) Calculated using the CKD-EPI Creatinine Equation (2021)    Anion gap 10/28/2022 13  5 - 15 Final   Performed at [facility], [address] [place], [place] [address]   Salicylate Lvl 10/28/2022 <7.0 (L)  7.0 - 30.0 mg/dL Final   Performed at [facility], [address] [place], [place] [address]   Acetaminophen (Tylenol), Serum 10/28/2022 <10 (L)  10 - 30 ug/mL Final   Comment: (NOTE) Therapeutic concentrations vary significantly. A range of 10-30 ug/mL  may be an effective concentration for many patients. However, some  are best treated at concentrations outside of this range. Acetaminophen concentrations >150 ug/mL at 4 hours after ingestion  and >50 ug/mL at 12 hours after ingestion are often associated with  toxic  reactions.  Performed at [facility], [address], [place], [place] [address]   Admission on 10/10/2022, Discharged on 10/10/2022  Component Date Value Ref Range Status   Glucose-Capillary 10/10/2022 >600 (HH)  70 - 99 mg/dL Final   Glucose reference range applies only to samples taken after fasting for at least 8 hours.   Sodium 10/10/2022 129 (L)  135 - 145 mmol/L Final   Potassium 10/10/2022 4.4  3.5 - 5.1 mmol/L Final   Chloride 10/10/2022 95 (L)  98 - 111 mmol/L Final   CO2 10/10/2022 24  22 - 32 mmol/L Final   Glucose, Bld 10/10/2022 683 (HH)  70 - 99 mg/dL Final   Comment: CRITICAL RESULT CALLED TO, READ BACK BY AND VERIFIED WITH LAMB,S. RN AT 1219 10/10/22 MULLINS,T Glucose reference range applies only to samples taken after fasting for at least 8 hours.    BUN 10/10/2022 16  6 - 20 mg/dL Final   Creatinine, Ser 10/10/2022 0.93  0.61 - 1.24 mg/dL Final   Calcium 62/13/0865 8.4 (L)  8.9 - 10.3 mg/dL Final   Total Protein 78/46/9629 6.9  6.5 - 8.1 g/dL Final   Albumin 52/84/1324 3.6  3.5 - 5.0 g/dL Final   AST 40/06/2724 16  15 - 41 U/L Final   ALT 10/10/2022 11  0 - 44 U/L Final   Alkaline Phosphatase 10/10/2022 61  38 - 126 U/L Final   Total Bilirubin 10/10/2022 0.7  0.3 - 1.2 mg/dL Final   GFR, Estimated 10/10/2022 >60  >60 mL/min Final   Comment: (NOTE) Calculated using the CKD-EPI Creatinine Equation (2021)    Anion gap 10/10/2022 10  5 - 15 Final   Performed at [facility], [address], [place], [place] [address]   Color, Urine 10/10/2022 STRAW (A)  YELLOW Final   APPearance 10/10/2022 CLEAR  CLEAR Final   Specific Gravity, Urine 10/10/2022 1.031 (H)  1.005 - 1.030 Final   pH 10/10/2022 5.0  5.0 - 8.0 Final   Glucose, UA 10/10/2022 >=500 (A)  NEGATIVE mg/dL Final   Hgb urine dipstick 10/10/2022 NEGATIVE  NEGATIVE Final   Bilirubin Urine 10/10/2022 NEGATIVE  NEGATIVE Final   Ketones, ur 10/10/2022 NEGATIVE  NEGATIVE mg/dL Final    Protein, ur 03/47/4259 NEGATIVE  NEGATIVE mg/dL Final   Nitrite 56/38/7564 NEGATIVE  NEGATIVE Final   Leukocytes,Ua 10/10/2022 NEGATIVE  NEGATIVE Final   RBC / HPF 10/10/2022 0-5  0 - 5 RBC/hpf Final   WBC, UA 10/10/2022 0-5  0 - 5 WBC/hpf Final   Bacteria, UA 10/10/2022 NONE SEEN  NONE SEEN Final   Squamous Epithelial / HPF 10/10/2022 0-5  0 - 5 /HPF Final   Performed at [facility], [address], [place], [place] [address]   Beta-Hydroxybutyric Acid 10/10/2022 0.41 (H)  0.05 - 0.27 mmol/L Final   Performed at [facility], [address] [place], [place] [address]   pH, Ven 10/10/2022 7.39  7.25 - 7.43 Final   pCO2, Ven 10/10/2022 42 (L)  44 - 60 mmHg Final   pO2, Ven 10/10/2022 59 (H)  32 - 45 mmHg Final   Bicarbonate 10/10/2022 25.4  20.0 - 28.0 mmol/L Final   Acid-Base Excess 10/10/2022 0.3  0.0 - 2.0 mmol/L Final   O2 Saturation 10/10/2022 92.1  % Final   Patient temperature 10/10/2022 37.0   Final   Performed at [facility], [address], [place], [place] [address]   Osmolality 10/10/2022 315 (H)  275 - 295 mOsm/kg Final   Comment: REPEATED TO VERIFY Performed at [facility], [address] [place], [place] [address]    WBC 10/10/2022 5.3  4.0 - 10.5 K/uL Final   RBC 10/10/2022 4.89  4.22 - 5.81 MIL/uL Final   Hemoglobin 10/10/2022 14.7  13.0 - 17.0 g/dL Final   HCT 65/78/4696 44.6  39.0 - 52.0 % Final   MCV 10/10/2022 91.2  80.0 - 100.0 fL Final   MCH 10/10/2022 30.1  26.0 - 34.0 pg Final   MCHC 10/10/2022 33.0  30.0 - 36.0 g/dL Final   RDW 29/52/8413 13.1  11.5 - 15.5 % Final   Platelets 10/10/2022 235  150 - 400 K/uL Final   nRBC 10/10/2022 0.0  0.0 - 0.2 % Final   Performed at [facility], [address] [place], [place] [address]   Glucose-Capillary 10/10/2022 480 (H)  70 - 99 mg/dL Final   Glucose reference range applies only to samples taken after fasting for at least 8  hours.    Blood Alcohol level:  Lab Results  Component Value Date   ETH <10 03/02/2023   ETH <10 10/28/2022    Metabolic Disorder Labs: Lab Results  Component Value Date   HGBA1C 10.9 (H) 03/02/2023   MPG 266.13 03/02/2023   MPG 231.69 03/22/2021   No results found for: "PROLACTIN" Lab Results  Component Value Date   CHOL 112 11/30/2022   TRIG 82 11/30/2022   HDL 52 11/30/2022   CHOLHDL 2.2 11/30/2022   VLDL 21 04/10/2017   LDLCALC 44 11/30/2022   LDLCALC 99 03/24/2021    Therapeutic Lab Levels: No results found for: "LITHIUM" No results found for: "VALPROATE" No results found for: "CBMZ"  Physical Findings   GAD-7    Flowsheet Row Office Visit from 11/30/2022 in [facility]  Total GAD-7 Score 12      PHQ2-9    Flowsheet Row ED from 03/03/2023 in [facility] Office Visit from 11[facility]30/2022 in [facility] Office Visit from 03/24/2021 in [facility] Office Visit from 08/23/2018 in [facility] Office Visit from 05/08/2017 in [facility]  PHQ-2 Total Score 2 0 0 0 0  PHQ-9 Total Score 3 3 -- -- --      Flowsheet Row ED from 03/03/2023 in [facility] ED from 03/01/2023 in [facility] ED from 02/05/2023 in [facility] Emergency Department  at [facility]  C-SSRS RISK CATEGORY No Risk High Risk No Risk        Musculoskeletal  Strength & Muscle Tone: within normal limits Gait & Station: normal Patient leans: N/A  Psychiatric Specialty Exam  Presentation  General Appearance:  Appropriate for Environment; Casual  Eye Contact: Good  Speech: Clear and Coherent; Normal Rate  Speech Volume: Normal  Handedness: Right   Mood and Affect  Mood: Happy  Affect: Full Range Euthymic  Thought Process  Thought Processes: Coherent;  Linear  Descriptions of Associations:Intact  Orientation:Full (Time, Place and Person)  Thought Content:Logical; WDL  Diagnosis of Schizophrenia or Schizoaffective disorder in past: No    Hallucinations: None Ideas of Reference:None  Suicidal Thoughts: None Homicidal Thoughts: None  Sensorium  Memory: Immediate Good; Recent Good  Judgment: Good  Insight: Good   Executive Functions  Concentration: Good  Attention Span: Good  Recall: Good  Fund of Knowledge: Good  Language: Good  Psychomotor Activity  Psychomotor Activity:Normal  Assets  Assets: Communication Skills; Desire for Improvement; Social Support  Sleep  Sleep: Good    Physical Exam  Physical Exam Vitals reviewed.  Constitutional:      General: He is not in acute distress.    Appearance: He is not toxic-appearing.  HENT:     Head: Normocephalic and atraumatic.     Mouth/Throat:     Mouth: Mucous membranes are moist.     Pharynx: Oropharynx is clear.  Pulmonary:     Effort: Pulmonary effort is normal.  Skin:    General: Skin is warm and dry.  Neurological:     General: No focal deficit present.     Mental Status: He is alert and oriented to person, place, and time.     Motor: No weakness.     Gait: Gait normal.      Review of Systems  Respiratory:  Negative for shortness of breath.   Cardiovascular:  Negative for chest pain.  Gastrointestinal:  Negative for abdominal pain, diarrhea, nausea and vomiting.  Neurological:  Negative for dizziness, tremors, seizures and headaches.   Blood pressure 122/70, pulse 60, temperature 98.3 F (36.8 C), temperature source Oral, resp. rate 18, SpO2 99 %. There is no height or weight on file to calculate BMI.  Treatment Plan Summary: Daily contact with patient to assess and evaluate symptoms and progress in treatment Dakota Kim is a 52 year old male with no formal psychiatric diagnoses prior to admission but who meets criteria for  cocaine use disorder, nicotine use disorder, and prolonged grief who presented voluntarily to [facility] for detox and substance use treatment, for which he was admitted to [facility].   Patient remains motivated for residential substance use treatment if possible given elevated A1C. He has good insight into his triggers and is committed to maintaining sobriety. He declined psychotropics for prolonged grief and would instead be open to connecting with a therapist for close follow-up post discharge. Patient accepted to [facility].  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 3 times daily as needed Continue trazodone 100 mg p.o. nightly as needed Continue gabapentin 300 mg p.o. 3 times daily Nicotine patch 14 mg daily PRN  #T2DM -Continue glipizide 5 mg p.o. daily -Continue insulin coverage Semglee 30 units q PM -Moderate SSI 24 hour CBGs 129-316  #Hyperlipidemia Continue Lipitor 10 mg daily Continue to monitor BP for improvement   Patient encouraged to participate within the therapeutic milieu  Dispo: Felecia Jan, MD 03/09/2023 1:52 PM

## 2023-03-09 NOTE — Group Note (Signed)
Group Topic: Wellness  Group Date: 03/09/2023 Start Time: 1030 End Time: 1130 Facilitators: Londell Moh, NT  Department: [facility]  Number of Participants: 2  Group Focus: activities of daily living skills Treatment Modality:  Psychoeducation Interventions utilized were group exercise and patient education Purpose: increase insight  Name: Dakota Kim Date of Birth: [birth year]  MR: [id number]    Level of Participation: active Quality of Participation: attentive Interactions with others: gave feedback Mood/Affect: appropriate and bright Triggers (if applicable): n/a Cognition: coherent/clear and goal directed Progress: Gaining insight Response:  Pt was very active during group, we talked about how exercising helps improve mental illness. We also spent time outside being active to show examples of what can be done outside of the facility.  Plan: patient will be encouraged to continue to attend future groups.  Patients Problems:  Patient Active Problem List   Diagnosis Date Noted   Prolonged grief disorder 03/05/2023   Polysubstance abuse (HCC) 03/03/2023   Substance induced mood disorder (HCC) 03/03/2023   Foot infection 11/30/2022   Hyperlipidemia 02/26/2019   Class 2 severe obesity due to excess calories with serious comorbidity and body mass index (BMI) of 35.0 to 35.9 in adult [facility]) 02/26/2019   Type 2 diabetes mellitus without complication, with long-term current use of insulin (HCC) 05/13/2017   Hypokalemia 11/06/2012

## 2023-03-09 NOTE — Group Note (Signed)
Group Topic: Spirituality in Recovery  Group Date: 03/09/2023 Start Time: 1230 End Time: 1330 Facilitators: Lenny Pastel  Department: [facility]  Number of Participants: 2  Group Focus: clarity of thought and self-awareness Treatment Modality:  Cognitive Behavioral Therapy Interventions utilized were story telling and support Purpose: increase insight, regain self-worth, reinforce self-care, and relapse prevention strategies  Name: Yohance Norlander Date of Birth: 09-Feb-1971  MR: [id number]    Level of Participation: active Quality of Participation: attentive, cooperative, initiates communication, motivated, offered feedback, and supportive Interactions with others: gave feedback Mood/Affect: appropriate and positive Triggers (if applicable): familiarity  Cognition: coherent/clear, goal directed, insightful, and logical Progress: Gaining insight Response: Patient actively participated in group on today. Patient was able to identify areas or times in his life where he felt like it was harder to focus on the good rather than the negative. Patient spoke a lot about his triggers and finding other ways to cope with his stressors when triggered. Patient reports he enjoys cooking and plans to explore more with that skill.  Patient reports feeling encouraged from watching the video and looks forward to further treatment. Patient was able to provide feedback to staff and peers.  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

## 2023-03-09 NOTE — ED Notes (Signed)
Patient observed/assessed in room in bed appearing in no immediate distress resting peacefully. Q15 minute checks continued by MHT and nursing staff. Will continue to monitor and support. 

## 2023-03-09 NOTE — Progress Notes (Signed)
Pt was visible in the milieu and had been appropriate on the unit. No distress noted or concerns voiced. Staff will monitor for pt's safety. 

## 2023-03-09 NOTE — ED Notes (Signed)
Pt was given lunch

## 2023-03-10 DIAGNOSIS — Z794 Long term (current) use of insulin: Secondary | ICD-10-CM | POA: Diagnosis not present

## 2023-03-10 DIAGNOSIS — E119 Type 2 diabetes mellitus without complications: Secondary | ICD-10-CM | POA: Diagnosis not present

## 2023-03-10 DIAGNOSIS — Z5900 Homelessness unspecified: Secondary | ICD-10-CM | POA: Diagnosis not present

## 2023-03-10 DIAGNOSIS — R45851 Suicidal ideations: Secondary | ICD-10-CM | POA: Diagnosis not present

## 2023-03-10 DIAGNOSIS — F1410 Cocaine abuse, uncomplicated: Secondary | ICD-10-CM | POA: Diagnosis not present

## 2023-03-10 DIAGNOSIS — Z560 Unemployment, unspecified: Secondary | ICD-10-CM | POA: Diagnosis not present

## 2023-03-10 LAB — GLUCOSE, CAPILLARY
Glucose-Capillary: 198 mg/dL — ABNORMAL HIGH (ref 70–99)
Glucose-Capillary: 200 mg/dL — ABNORMAL HIGH (ref 70–99)
Glucose-Capillary: 194 mg/dL — ABNORMAL HIGH (ref 70–99)
Glucose-Capillary: 205 mg/dL — ABNORMAL HIGH (ref 70–99)

## 2023-03-10 NOTE — Group Note (Signed)
Group Topic: Balance in Life  Group Date: 03/09/2023 Start Time: 0700 End Time: 0840 Facilitators: Rae Lips B  Department: [facility]  Number of Participants: 2  Group Focus: acceptance Treatment Modality:  Individual Therapy Interventions utilized were leisure development Purpose: express feelings and increase insight  Name: Darrick Salva Date of Birth: 1971/08/24  MR: [id number]    Level of Participation: active Quality of Participation: attentive Interactions with others: gave feedback Mood/Affect: appropriate Triggers (if applicable): NA Cognition: coherent/clear Progress: Gaining insight Response: NA Plan: patient will be encouraged to keep going to groups.   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

## 2023-03-10 NOTE — ED Notes (Signed)
Pt in dayroom watching TV with peer. A/O , pleasant. Denies SI/HI/AVH. Denies s/s of withdrawal. No noted distress. Will continue to monitor for safety

## 2023-03-10 NOTE — Group Note (Signed)
Group Topic: Healthy Self Image and Positive Change  Group Date: 03/10/2023 Start Time: 1430 End Time: 1500 Facilitators: Maeola Sarah  Department: [facility]  Number of Participants: 2  Group Focus: coping skills Treatment Modality:  Psychoeducation Interventions utilized were patient education Purpose: enhance coping skills, increase insight, and reinforce self-care  Name: Dakota Kim Date of Birth: [birth year]  MR: [id number]    Level of Participation: active Quality of Participation: attentive, cooperative, engaged, and offered feedback Interactions with others: gave feedback Mood/Affect: appropriate and positive Triggers (if applicable): N/A Cognition: coherent/clear Progress: Gaining insight Response: Patient shared that he was going to use the same strength to stay sober, that he used to get high. Patient also stated that he wasn't using the skills he had in the past but will utilize them from now on. 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

## 2023-03-10 NOTE — Group Note (Signed)
Group Topic: Balance in Life  Group Date: 03/10/2023 Start Time: 0800 End Time: 0900 Facilitators: Rae Lips B  Department: [facility]  Number of Participants: 2  Group Focus: acceptance Treatment Modality:  Exposure Therapy Interventions utilized were leisure development Purpose: express feelings  Name: Dakota Kim Date of Birth: 09-01-1971  MR: [id number]    Level of Participation: active Quality of Participation: good Interactions with others: gave feedback Mood/Affect: appropriate Triggers (if applicable): NA Cognition: coherent/clear Progress: Gaining insight Response: NA Plan: patient will be encouraged to keep going to groups.   Patients Problems:  Patient Active Problem List   Diagnosis Date Noted   Prolonged grief disorder 03/05/2023   Polysubstance abuse (HCC) 03/03/2023   Substance induced mood disorder (HCC) 03/03/2023   Foot infection 11/30/2022   Hyperlipidemia 02/26/2019   Class 2 severe obesity due to excess calories with serious comorbidity and body mass index (BMI) of 35.0 to 35.9 in adult [facility]) 02/26/2019   Type 2 diabetes mellitus without complication, with long-term current use of insulin (HCC) 05/13/2017   Hypokalemia 11/06/2012

## 2023-03-10 NOTE — ED Notes (Signed)
Patient was provided with dinner 

## 2023-03-10 NOTE — Group Note (Signed)
Group Topic: Social Support  Group Date: 03/10/2023 Start Time: 0915 End Time: 1000 Facilitators: Prentice Docker, RN  Department: [facility]  Number of Participants: 2  Group Focus: coping skills Treatment Modality:  Individual Therapy Interventions utilized were support Purpose: enhance coping skills  Name: Dakota Kim Date of Birth: September 19, 1970  MR: [id number]    Level of Participation: active Quality of Participation: attentive, cooperative, engaged, initiates communication, and motivated Interactions with others: gave feedback Mood/Affect: appropriate and positive Triggers (if applicable): n/a Cognition: goal directed and insightful Progress: Gaining insight Response: "I know I'm gonna stay clean because I want a better way of living than this. I'm doing this for no one else but me" Plan: patient will be encouraged to continue tx and stay positive one day at a time  Patients Problems:  Patient Active Problem List   Diagnosis Date Noted   Prolonged grief disorder 03/05/2023   Polysubstance abuse (HCC) 03/03/2023   Substance induced mood disorder (HCC) 03/03/2023   Foot infection 11/30/2022   Hyperlipidemia 02/26/2019   Class 2 severe obesity due to excess calories with serious comorbidity and body mass index (BMI) of 35.0 to 35.9 in adult [facility]) 02/26/2019   Type 2 diabetes mellitus without complication, with long-term current use of insulin (HCC) 05/13/2017   Hypokalemia 11/06/2012

## 2023-03-10 NOTE — ED Notes (Signed)
Pt sleeping in no acute distress. RR even and unlabored. Environment secured. Will continue to monitor for safety. 

## 2023-03-10 NOTE — ED Notes (Signed)
Patient was provided with lunch 

## 2023-03-10 NOTE — ED Notes (Signed)
Patient was provided with breakfast 

## 2023-03-10 NOTE — ED Notes (Signed)
Pt in dayroom for AA meeting 

## 2023-03-10 NOTE — ED Notes (Signed)
Pt sitting in dayroom interacting with another peer. No acute distress noted. No concerns voiced. Informed pt to notify staff with any needs or assistance. Pt verbalized understanding or agreement. Will continue to monitor for safety. 

## 2023-03-10 NOTE — ED Notes (Signed)
Pt is in the bed sleeping. Respirations are even and unlabored. No acute distress noted. Will continue to monitor for safety. 

## 2023-03-10 NOTE — ED Provider Notes (Signed)
Behavioral Health Progress Note  Date and Time: 03/10/2023 9:56 AM Name: Dakota Kim MRN:  [id number]  Subjective:  Dakota Kim is a 52 year old male with no formal psychiatric diagnoses prior to admission but who meets criteria for cocaine use disorder, nicotine use disorder, and prolonged grief who presented voluntarily to [facility] for detox and substance use treatment, for which he was admitted to [facility].   Patient had no overnight events. On assessment this AM, patient reports that he slept very well last night. Patient reports that his mood is good and stable. He reports that his appetite is back at baseline.Patient denies feeling irritable. He reports that he has a healthy anxiety about transitioning to his rehab program and he is looking forward to this step. Patient denies SI, HI, and AVH.   Diagnosis:  Final diagnoses:  Substance induced mood disorder (HCC)  Polysubstance abuse (HCC)  Prolonged grief disorder    Total Time spent with patient: 15 minutes  Past Psychiatric History: See HPI Past Medical History: See HPI Family History: See HPI Family Psychiatric  History: See HPI Social History: See HPI  Additional Social History:                         Sleep: Good  Appetite:  Good  Current Medications:  Current Facility-Administered Medications  Medication Dose Route Frequency Provider Last Rate Last Admin   acetaminophen (TYLENOL) tablet 650 mg  650 mg Oral Q6H PRN [place], Shuvon B, NP   650 mg at 03/06/23 2033   alum & mag hydroxide-simeth (MAALOX/MYLANTA) 200-200-20 MG/5ML suspension 30 mL  30 mL Oral Q4H PRN [place], Shuvon B, NP       atorvastatin (LIPITOR) tablet 10 mg  10 mg Oral Daily [place], Shuvon B, NP   10 mg at 03/10/23 0926   gabapentin (NEURONTIN) capsule 300 mg  300 mg Oral BID [place], Shuvon B, NP   300 mg at 03/10/23 0926   glipiZIDE (GLUCOTROL) tablet 5 mg  5 mg Oral QAC breakfast [place], Shuvon B, NP   5 mg at 03/10/23 0754   insulin aspart  (novoLOG) injection 0-15 Units  0-15 Units Subcutaneous TID WC [place], Shuvon B, NP   3 Units at 03/10/23 0752   insulin aspart (novoLOG) injection 0-5 Units  0-5 Units Subcutaneous QHS [place], Shuvon B, NP   3 Units at 03/09/23 2128   insulin glargine-yfgn (SEMGLEE) injection 30 Units  30 Units Subcutaneous QHS Lamar Sprinkles, MD   30 Units at 03/09/23 2126   nicotine (NICODERM CQ - dosed in mg/24 hours) patch 14 mg  14 mg Transdermal Daily PRN Lamar Sprinkles, MD       polyethylene glycol (MIRALAX / GLYCOLAX) packet 17 g  17 g Oral Daily PRN Ajibola, Ene A, NP   17 g at 03/03/23 2138   traZODone (DESYREL) tablet 100-150 mg  100-150 mg Oral QHS PRN Lamar Sprinkles, MD   100 mg at 03/09/23 2126   Current Outpatient Medications  Medication Sig Dispense Refill   atorvastatin (LIPITOR) 10 MG tablet TAKE 1 TABLET (10 MG TOTAL) BY MOUTH DAILY. (Patient not taking: Reported on 03/02/2023) 30 tablet 11   gabapentin (NEURONTIN) 300 MG capsule Take 1 capsule (300 mg total) by mouth 2 (two) times daily. (Patient not taking: Reported on 03/02/2023) 60 capsule 2   glipiZIDE (GLUCOTROL) 5 MG tablet Take 1 tablet (5 mg total) by mouth daily before breakfast. (Patient not taking: Reported on 03/02/2023) 60 tablet 0  hydrOXYzine (ATARAX) 25 MG tablet Take 1 tablet (25 mg total) by mouth 3 (three) times daily as needed for anxiety. 30 tablet 0   insulin aspart (NOVOLOG) 100 UNIT/ML injection Inject 10 Units into the skin 3 (three) times daily with meals. 10 mL 11   insulin aspart (NOVOLOG) 100 UNIT/ML injection Inject 0-15 Units into the skin 3 (three) times daily with meals. 10 mL 11   insulin aspart (NOVOLOG) 100 UNIT/ML injection Inject 0-5 Units into the skin at bedtime. 10 mL 11   Insulin Glargine (BASAGLAR KWIKPEN) 100 UNIT/ML Inject 15 Units into the skin at bedtime. (Patient taking differently: Inject 20 Units into the skin at bedtime.) 15 mL 1   insulin glargine-yfgn (SEMGLEE) 100 UNIT/ML injection Inject  0.2 mLs (20 Units total) into the skin daily. 10 mL 11   traZODone (DESYREL) 50 MG tablet Take 1 tablet (50 mg total) by mouth at bedtime as needed for sleep.      Labs  Lab Results:  Admission on 03/03/2023  Component Date Value Ref Range Status   Glucose-Capillary 03/03/2023 287 (H)  70 - 99 mg/dL Final   Glucose reference range applies only to samples taken after fasting for at least 8 hours.   Glucose-Capillary 03/04/2023 252 (H)  70 - 99 mg/dL Final   Glucose reference range applies only to samples taken after fasting for at least 8 hours.   Glucose-Capillary 03/04/2023 341 (H)  70 - 99 mg/dL Final   Glucose reference range applies only to samples taken after fasting for at least 8 hours.   Glucose-Capillary 03/04/2023 286 (H)  70 - 99 mg/dL Final   Glucose reference range applies only to samples taken after fasting for at least 8 hours.   Glucose-Capillary 03/03/2023 175 (H)  70 - 99 mg/dL Final   Glucose reference range applies only to samples taken after fasting for at least 8 hours.   Glucose-Capillary 03/04/2023 182 (H)  70 - 99 mg/dL Final   Glucose reference range applies only to samples taken after fasting for at least 8 hours.   Glucose-Capillary 03/05/2023 254 (H)  70 - 99 mg/dL Final   Glucose reference range applies only to samples taken after fasting for at least 8 hours.   Glucose-Capillary 03/05/2023 163 (H)  70 - 99 mg/dL Final   Glucose reference range applies only to samples taken after fasting for at least 8 hours.   Glucose-Capillary 03/05/2023 242 (H)  70 - 99 mg/dL Final   Glucose reference range applies only to samples taken after fasting for at least 8 hours.   Glucose-Capillary 03/05/2023 387 (H)  70 - 99 mg/dL Final   Glucose reference range applies only to samples taken after fasting for at least 8 hours.   Glucose-Capillary 03/06/2023 232 (H)  70 - 99 mg/dL Final   Glucose reference range applies only to samples taken after fasting for at least 8 hours.    Glucose-Capillary 03/06/2023 231 (H)  70 - 99 mg/dL Final   Glucose reference range applies only to samples taken after fasting for at least 8 hours.   Glucose-Capillary 03/06/2023 296 (H)  70 - 99 mg/dL Final   Glucose reference range applies only to samples taken after fasting for at least 8 hours.   Glucose-Capillary 03/06/2023 257 (H)  70 - 99 mg/dL Final   Glucose reference range applies only to samples taken after fasting for at least 8 hours.   Glucose-Capillary 03/07/2023 269 (H)  70 - 99 mg/dL  Final   Glucose reference range applies only to samples taken after fasting for at least 8 hours.   Glucose-Capillary 03/06/2023 250 (H)  70 - 99 mg/dL Final   Glucose reference range applies only to samples taken after fasting for at least 8 hours.   Glucose-Capillary 03/07/2023 256 (H)  70 - 99 mg/dL Final   Glucose reference range applies only to samples taken after fasting for at least 8 hours.   Glucose-Capillary 03/07/2023 237 (H)  70 - 99 mg/dL Final   Glucose reference range applies only to samples taken after fasting for at least 8 hours.   Glucose-Capillary 03/07/2023 270 (H)  70 - 99 mg/dL Final   Glucose reference range applies only to samples taken after fasting for at least 8 hours.   Glucose-Capillary 03/08/2023 230 (H)  70 - 99 mg/dL Final   Glucose reference range applies only to samples taken after fasting for at least 8 hours.   Glucose-Capillary 03/08/2023 188 (H)  70 - 99 mg/dL Final   Glucose reference range applies only to samples taken after fasting for at least 8 hours.   Glucose-Capillary 03/08/2023 129 (H)  70 - 99 mg/dL Final   Glucose reference range applies only to samples taken after fasting for at least 8 hours.   Glucose-Capillary 03/08/2023 316 (H)  70 - 99 mg/dL Final   Glucose reference range applies only to samples taken after fasting for at least 8 hours.   Glucose-Capillary 03/09/2023 207 (H)  70 - 99 mg/dL Final   Glucose reference range applies only  to samples taken after fasting for at least 8 hours.   Glucose-Capillary 03/09/2023 155 (H)  70 - 99 mg/dL Final   Glucose reference range applies only to samples taken after fasting for at least 8 hours.   Glucose-Capillary 03/09/2023 203 (H)  70 - 99 mg/dL Final   Glucose reference range applies only to samples taken after fasting for at least 8 hours.   Glucose-Capillary 03/09/2023 270 (H)  70 - 99 mg/dL Final   Glucose reference range applies only to samples taken after fasting for at least 8 hours.   Comment 1 03/09/2023 RN  NOTIEFIED   Final   Glucose-Capillary 03/10/2023 198 (H)  70 - 99 mg/dL Final   Glucose reference range applies only to samples taken after fasting for at least 8 hours.  Admission on 03/01/2023, Discharged on 03/03/2023  Component Date Value Ref Range Status   WBC 03/02/2023 5.7  4.0 - 10.5 K/uL Final   RBC 03/02/2023 4.71  4.22 - 5.81 MIL/uL Final   Hemoglobin 03/02/2023 13.7  13.0 - 17.0 g/dL Final   HCT 16/06/9603 43.7  39.0 - 52.0 % Final   MCV 03/02/2023 92.8  80.0 - 100.0 fL Final   MCH 03/02/2023 29.1  26.0 - 34.0 pg Final   MCHC 03/02/2023 31.4  30.0 - 36.0 g/dL Final   RDW 54/05/8118 14.1  11.5 - 15.5 % Final   Platelets 03/02/2023 256  150 - 400 K/uL Final   nRBC 03/02/2023 0.0  0.0 - 0.2 % Final   Neutrophils Relative % 03/02/2023 53  % Final   Neutro Abs 03/02/2023 3.1  1.7 - 7.7 K/uL Final   Lymphocytes Relative 03/02/2023 32  % Final   Lymphs Abs 03/02/2023 1.8  0.7 - 4.0 K/uL Final   Monocytes Relative 03/02/2023 11  % Final   Monocytes Absolute 03/02/2023 0.6  0.1 - 1.0 K/uL Final   Eosinophils Relative 03/02/2023  2  % Final   Eosinophils Absolute 03/02/2023 0.1  0.0 - 0.5 K/uL Final   Basophils Relative 03/02/2023 1  % Final   Basophils Absolute 03/02/2023 0.0  0.0 - 0.1 K/uL Final   Immature Granulocytes 03/02/2023 1  % Final   Abs Immature Granulocytes 03/02/2023 0.04  0.00 - 0.07 K/uL Final   Performed at [facility], [address], [place], [place] [address]   Sodium 03/02/2023 137  135 - 145 mmol/L Final   Potassium 03/02/2023 5.0  3.5 - 5.1 mmol/L Final   Chloride 03/02/2023 100  98 - 111 mmol/L Final   CO2 03/02/2023 27  22 - 32 mmol/L Final   Glucose, Bld 03/02/2023 424 (H)  70 - 99 mg/dL Final   Glucose reference range applies only to samples taken after fasting for at least 8 hours.   BUN 03/02/2023 14  6 - 20 mg/dL Final   Creatinine, Ser 03/02/2023 0.90  0.61 - 1.24 mg/dL Final   Calcium 81/19/1478 9.3  8.9 - 10.3 mg/dL Final   Total Protein 29/56/2130 6.5  6.5 - 8.1 g/dL Final   Albumin 86/57/8469 3.6  3.5 - 5.0 g/dL Final   AST 62/95/2841 13 (L)  15 - 41 U/L Final   ALT 03/02/2023 16  0 - 44 U/L Final   Alkaline Phosphatase 03/02/2023 65  38 - 126 U/L Final   Total Bilirubin 03/02/2023 0.5  0.3 - 1.2 mg/dL Final   GFR, Estimated 03/02/2023 >60  >60 mL/min Final   Comment: (NOTE) Calculated using the CKD-EPI Creatinine Equation (2021)    Anion gap 03/02/2023 10  5 - 15 Final   Performed at [facility], [address] [place], [place] [address]   Hgb A1c MFr Bld 03/02/2023 10.9 (H)  4.8 - 5.6 % Final   Comment: (NOTE) Pre diabetes:          5.7%-6.4%  Diabetes:              >6.4%  Glycemic control for   <7.0% adults with diabetes    Mean Plasma Glucose 03/02/2023 266.13  mg/dL Final   Performed at [facility], [address], [place], [place] [address]   Alcohol, Ethyl (B) 03/02/2023 <10  <10 mg/dL Final   Comment: (NOTE) Lowest detectable limit for serum alcohol is 10 mg/dL.  For medical purposes only. Performed at [facility], [address], [place], [place] [address]    TSH 03/02/2023 1.178  0.350 - 4.500 uIU/mL Final   Comment: Performed by a 3rd Generation assay with a functional sensitivity of <=0.01 uIU/mL. Performed at [facility], [address], [place], [place] [address]    POC Amphetamine UR 03/02/2023 None Detected  NONE DETECTED (Cut Off  Level 1000 ng/mL) Final   POC Secobarbital (BAR) 03/02/2023 None Detected  NONE DETECTED (Cut Off Level 300 ng/mL) Final   POC Buprenorphine (BUP) 03/02/2023 None Detected  NONE DETECTED (Cut Off Level 10 ng/mL) Final   POC Oxazepam (BZO) 03/02/2023 None Detected  NONE DETECTED (Cut Off Level 300 ng/mL) Final   POC Cocaine UR 03/02/2023 Positive (A)  NONE DETECTED (Cut Off Level 300 ng/mL) Final   POC Methamphetamine UR 03/02/2023 None Detected  NONE DETECTED (Cut Off Level 1000 ng/mL) Final   POC Morphine 03/02/2023 None Detected  NONE DETECTED (Cut Off Level 300 ng/mL) Final   POC Methadone UR 03/02/2023 None Detected  NONE DETECTED (Cut Off  Level 300 ng/mL) Final   POC Oxycodone UR 03/02/2023 None Detected  NONE DETECTED (Cut Off Level 100 ng/mL) Final   POC Marijuana UR 03/02/2023 Positive (A)  NONE DETECTED (Cut Off Level 50 ng/mL) Final   Glucose-Capillary 03/02/2023 499 (H)  70 - 99 mg/dL Final   Glucose reference range applies only to samples taken after fasting for at least 8 hours.   Glucose-Capillary 03/02/2023 404 (H)  70 - 99 mg/dL Final   Glucose reference range applies only to samples taken after fasting for at least 8 hours.   Glucose-Capillary 03/02/2023 214 (H)  70 - 99 mg/dL Final   Glucose reference range applies only to samples taken after fasting for at least 8 hours.   Glucose-Capillary 03/02/2023 265 (H)  70 - 99 mg/dL Final   Glucose reference range applies only to samples taken after fasting for at least 8 hours.   Glucose-Capillary 03/03/2023 263 (H)  70 - 99 mg/dL Final   Glucose reference range applies only to samples taken after fasting for at least 8 hours.   Glucose-Capillary 03/03/2023 258 (H)  70 - 99 mg/dL Final   Glucose reference range applies only to samples taken after fasting for at least 8 hours.  Admission on 02/05/2023, Discharged on 02/05/2023  Component Date Value Ref Range Status   Glucose-Capillary 02/05/2023 380 (H)  70 - 99 mg/dL Final    Glucose reference range applies only to samples taken after fasting for at least 8 hours.   Sodium 02/05/2023 138  135 - 145 mmol/L Final   Potassium 02/05/2023 4.0  3.5 - 5.1 mmol/L Final   Chloride 02/05/2023 103  98 - 111 mmol/L Final   CO2 02/05/2023 26  22 - 32 mmol/L Final   Glucose, Bld 02/05/2023 380 (H)  70 - 99 mg/dL Final   Glucose reference range applies only to samples taken after fasting for at least 8 hours.   BUN 02/05/2023 11  6 - 20 mg/dL Final   Creatinine, Ser 02/05/2023 0.92  0.61 - 1.24 mg/dL Final   Calcium 16/06/9603 9.1  8.9 - 10.3 mg/dL Final   GFR, Estimated 02/05/2023 >60  >60 mL/min Final   Comment: (NOTE) Calculated using the CKD-EPI Creatinine Equation (2021)    Anion gap 02/05/2023 9  5 - 15 Final   Performed at [facility], [address] [place], [place] [address]   WBC 02/05/2023 7.2  4.0 - 10.5 K/uL Final   RBC 02/05/2023 5.40  4.22 - 5.81 MIL/uL Final   Hemoglobin 02/05/2023 16.0  13.0 - 17.0 g/dL Final   HCT 11/91/4782 49.7  39.0 - 52.0 % Final   MCV 02/05/2023 92.0  80.0 - 100.0 fL Final   MCH 02/05/2023 29.6  26.0 - 34.0 pg Final   MCHC 02/05/2023 32.2  30.0 - 36.0 g/dL Final   RDW 95/62/1308 13.9  11.5 - 15.5 % Final   Platelets 02/05/2023 266  150 - 400 K/uL Final   nRBC 02/05/2023 0.0  0.0 - 0.2 % Final   Performed at [facility], [address], [place], [place] [address]   Glucose-Capillary 02/05/2023 342 (H)  70 - 99 mg/dL Final   Glucose reference range applies only to samples taken after fasting for at least 8 hours.   Total Protein 02/05/2023 7.5  6.5 - 8.1 g/dL Final   Albumin 69/62/9528 4.1  3.5 - 5.0 g/dL Final   AST 41/32/4401 16  15 - 41 U/L Final  ALT 02/05/2023 12  0 - 44 U/L Final   Alkaline Phosphatase 02/05/2023 83  38 - 126 U/L Final   Total Bilirubin 02/05/2023 1.2  0.3 - 1.2 mg/dL Final   Bilirubin, Direct 02/05/2023 0.3 (H)  0.0 - 0.2 mg/dL Final   Indirect Bilirubin 02/05/2023 0.9  0.3 - 0.9 mg/dL  Final   Performed at [facility], [address], [place], [place] [address]   Lipase 02/05/2023 36  11 - 51 U/L Final   Performed at [facility], [address] [place], [place] [address]   pH, Ven 02/05/2023 7.352  7.25 - 7.43 Final   pCO2, Ven 02/05/2023 48.6  44 - 60 mmHg Final   pO2, Ven 02/05/2023 28 (LL)  32 - 45 mmHg Final   Bicarbonate 02/05/2023 26.9  20.0 - 28.0 mmol/L Final   TCO2 02/05/2023 28  22 - 32 mmol/L Final   O2 Saturation 02/05/2023 49  % Final   Acid-Base Excess 02/05/2023 0.0  0.0 - 2.0 mmol/L Final   Sodium 02/05/2023 140  135 - 145 mmol/L Final   Potassium 02/05/2023 3.9  3.5 - 5.1 mmol/L Final   Calcium, Ion 02/05/2023 1.22  1.15 - 1.40 mmol/L Final   HCT 02/05/2023 50.0  39.0 - 52.0 % Final   Hemoglobin 02/05/2023 17.0  13.0 - 17.0 g/dL Final   Sample type 19/14/7829 VENOUS   Final   Comment 02/05/2023 NOTIFIED PHYSICIAN   Final   Beta-Hydroxybutyric Acid 02/05/2023 0.21  0.05 - 0.27 mmol/L Final   Performed at [facility], [address] [place], [place] [address]   Glucose-Capillary 02/05/2023 213 (H)  70 - 99 mg/dL Final   Glucose reference range applies only to samples taken after fasting for at least 8 hours.  Admission on 01/27/2023, Discharged on 01/27/2023  Component Date Value Ref Range Status   Glucose-Capillary 01/27/2023 454 (H)  70 - 99 mg/dL Final   Glucose reference range applies only to samples taken after fasting for at least 8 hours.   WBC 01/27/2023 5.3  4.0 - 10.5 K/uL Final   RBC 01/27/2023 4.77  4.22 - 5.81 MIL/uL Final   Hemoglobin 01/27/2023 14.2  13.0 - 17.0 g/dL Final   HCT 08/65/7846 43.5  39.0 - 52.0 % Final   MCV 01/27/2023 91.2  80.0 - 100.0 fL Final   MCH 01/27/2023 29.8  26.0 - 34.0 pg Final   MCHC 01/27/2023 32.6  30.0 - 36.0 g/dL Final   RDW 96/29/5284 13.9  11.5 - 15.5 % Final   Platelets 01/27/2023 238  150 - 400 K/uL Final   nRBC 01/27/2023 0.0  0.0 - 0.2 % Final   Neutrophils Relative %  01/27/2023 68  % Final   Neutro Abs 01/27/2023 3.5  1.7 - 7.7 K/uL Final   Lymphocytes Relative 01/27/2023 23  % Final   Lymphs Abs 01/27/2023 1.2  0.7 - 4.0 K/uL Final   Monocytes Relative 01/27/2023 9  % Final   Monocytes Absolute 01/27/2023 0.5  0.1 - 1.0 K/uL Final   Eosinophils Relative 01/27/2023 0  % Final   Eosinophils Absolute 01/27/2023 0.0  0.0 - 0.5 K/uL Final   Basophils Relative 01/27/2023 0  % Final   Basophils Absolute 01/27/2023 0.0  0.0 - 0.1 K/uL Final   Immature Granulocytes 01/27/2023 0  % Final   Abs Immature Granulocytes 01/27/2023 0.02  0.00 - 0.07 K/uL Final   Performed at [facility], [address] [place], [place] [address]   Sodium 01/27/2023 133 (L)  135 - 145 mmol/L Final   Potassium 01/27/2023 4.5  3.5 - 5.1 mmol/L Final   Chloride 01/27/2023 99  98 - 111 mmol/L Final   CO2 01/27/2023 25  22 - 32 mmol/L Final   Glucose, Bld 01/27/2023 410 (H)  70 - 99 mg/dL Final   Glucose reference range applies only to samples taken after fasting for at least 8 hours.   BUN 01/27/2023 17  6 - 20 mg/dL Final   Creatinine, Ser 01/27/2023 0.89  0.61 - 1.24 mg/dL Final   Calcium 86/57/8469 8.5 (L)  8.9 - 10.3 mg/dL Final   Total Protein 62/95/2841 6.8  6.5 - 8.1 g/dL Final   Albumin 32/44/0102 3.6  3.5 - 5.0 g/dL Final   AST 72/53/6644 19  15 - 41 U/L Final   ALT 01/27/2023 15  0 - 44 U/L Final   Alkaline Phosphatase 01/27/2023 60  38 - 126 U/L Final   Total Bilirubin 01/27/2023 0.7  0.3 - 1.2 mg/dL Final   GFR, Estimated 01/27/2023 >60  >60 mL/min Final   Comment: (NOTE) Calculated using the CKD-EPI Creatinine Equation (2021)    Anion gap 01/27/2023 9  5 - 15 Final   Performed at [facility], [address], [place], [place] [address]   Glucose-Capillary 01/27/2023 339 (H)  70 - 99 mg/dL Final   Glucose reference range applies only to samples taken after fasting for at least 8 hours.  Admission on 12/23/2022, Discharged on  12/23/2022  Component Date Value Ref Range Status   Glucose-Capillary 12/23/2022 339 (H)  70 - 99 mg/dL Final   Glucose reference range applies only to samples taken after fasting for at least 8 hours.   Comment 1 12/23/2022 Notify RN   Final   Sodium 12/23/2022 132 (L)  135 - 145 mmol/L Final   Potassium 12/23/2022 4.0  3.5 - 5.1 mmol/L Final   Chloride 12/23/2022 102  98 - 111 mmol/L Final   CO2 12/23/2022 26  22 - 32 mmol/L Final   Glucose, Bld 12/23/2022 351 (H)  70 - 99 mg/dL Final   Glucose reference range applies only to samples taken after fasting for at least 8 hours.   BUN 12/23/2022 13  6 - 20 mg/dL Final   Creatinine, Ser 12/23/2022 0.70  0.61 - 1.24 mg/dL Final   Calcium 25/95/6387 8.3 (L)  8.9 - 10.3 mg/dL Final   GFR, Estimated 12/23/2022 >60  >60 mL/min Final   Comment: (NOTE) Calculated using the CKD-EPI Creatinine Equation (2021)    Anion gap 12/23/2022 4 (L)  5 - 15 Final   Performed at [facility], [address], [place], [place] [address]   WBC 12/23/2022 6.9  4.0 - 10.5 K/uL Final   RBC 12/23/2022 4.60  4.22 - 5.81 MIL/uL Final   Hemoglobin 12/23/2022 13.6  13.0 - 17.0 g/dL Final   HCT 29/51/8841 42.7  39.0 - 52.0 % Final   MCV 12/23/2022 92.8  80.0 - 100.0 fL Final   MCH 12/23/2022 29.6  26.0 - 34.0 pg Final   MCHC 12/23/2022 31.9  30.0 - 36.0 g/dL Final   RDW 66/02/3015 13.4  11.5 - 15.5 % Final   Platelets 12/23/2022 268  150 - 400 K/uL Final   nRBC 12/23/2022 0.0  0.0 - 0.2 % Final   Performed at [facility], [address] [place], [place] [address]  Color, Urine 12/23/2022 YELLOW  YELLOW Final   APPearance 12/23/2022 CLEAR  CLEAR Final   Specific Gravity, Urine 12/23/2022 1.026  1.005 - 1.030 Final   pH 12/23/2022 7.0  5.0 - 8.0 Final   Glucose, UA 12/23/2022 >=500 (A)  NEGATIVE mg/dL Final   Hgb urine dipstick 12/23/2022 NEGATIVE  NEGATIVE Final   Bilirubin Urine 12/23/2022 NEGATIVE  NEGATIVE Final    Ketones, ur 12/23/2022 NEGATIVE  NEGATIVE mg/dL Final   Protein, ur 16/06/9603 NEGATIVE  NEGATIVE mg/dL Final   Nitrite 54/05/8118 NEGATIVE  NEGATIVE Final   Leukocytes,Ua 12/23/2022 NEGATIVE  NEGATIVE Final   RBC / HPF 12/23/2022 0-5  0 - 5 RBC/hpf Final   WBC, UA 12/23/2022 0-5  0 - 5 WBC/hpf Final   Bacteria, UA 12/23/2022 NONE SEEN  NONE SEEN Final   Squamous Epithelial / HPF 12/23/2022 0-5  0 - 5 /HPF Final   Performed at [facility], [address] [place], [place] [address]   Glucose-Capillary 12/23/2022 295 (H)  70 - 99 mg/dL Final   Glucose reference range applies only to samples taken after fasting for at least 8 hours.   Glucose-Capillary 12/23/2022 231 (H)  70 - 99 mg/dL Final   Glucose reference range applies only to samples taken after fasting for at least 8 hours.  Office Visit on 11/30/2022  Component Date Value Ref Range Status   Creatinine, Urine 11/30/2022 108.2  Not Estab. mg/dL Final   Microalbumin, Urine 11/30/2022 12.1  Not Estab. ug/mL Final   Microalb/Creat Ratio 11/30/2022 11  0 - 29 mg/g creat Final   Comment:                        Normal:                0 -  29                        Moderately increased: 30 - 300                        Severely increased:       >300    Glucose 11/30/2022 49 (L)  70 - 99 mg/dL Final   BUN 95/62/1308 11  6 - 24 mg/dL Final   Creatinine, Ser 11/30/2022 0.77  0.76 - 1.27 mg/dL Final   eGFR 65/78/4696 108  >59 mL/min/1.73 Final   BUN/Creatinine Ratio 11/30/2022 14  9 - 20 Final   Sodium 11/30/2022 144  134 - 144 mmol/L Final   Potassium 11/30/2022 5.2  3.5 - 5.2 mmol/L Final   Chloride 11/30/2022 104  96 - 106 mmol/L Final   CO2 11/30/2022 26  20 - 29 mmol/L Final   Calcium 11/30/2022 9.3  8.7 - 10.2 mg/dL Final   Total Protein 29/52/8413 7.0  6.0 - 8.5 g/dL Final   Albumin 24/40/1027 4.2  3.8 - 4.9 g/dL Final   Globulin, Total 11/30/2022 2.8  1.5 - 4.5 g/dL Final   Albumin/Globulin Ratio 11/30/2022 1.5   1.2 - 2.2 Final   Bilirubin Total 11/30/2022 0.2  0.0 - 1.2 mg/dL Final   Alkaline Phosphatase 11/30/2022 66  44 - 121 IU/L Final   AST 11/30/2022 16  0 - 40 IU/L Final   ALT 11/30/2022 13  0 - 44 IU/L Final   Hemoglobin A1C 11/30/2022 9.0 (A)  4.0 - 5.6 % Final   POC  Glucose 11/30/2022 60 (A)  70 - 99 mg/dl Final   WBC 16/06/9603 7.7  3.4 - 10.8 x10E3/uL Final   RBC 11/30/2022 4.94  4.14 - 5.80 x10E6/uL Final   Hemoglobin 11/30/2022 14.6  13.0 - 17.7 g/dL Final   Hematocrit 54/05/8118 45.2  37.5 - 51.0 % Final   MCV 11/30/2022 92  79 - 97 fL Final   MCH 11/30/2022 29.6  26.6 - 33.0 pg Final   MCHC 11/30/2022 32.3  31.5 - 35.7 g/dL Final   RDW 14/78/2956 12.7  11.6 - 15.4 % Final   Platelets 11/30/2022 269  150 - 450 x10E3/uL Final   Neutrophils 11/30/2022 61  Not Estab. % Final   Lymphs 11/30/2022 28  Not Estab. % Final   Monocytes 11/30/2022 10  Not Estab. % Final   Eos 11/30/2022 1  Not Estab. % Final   Basos 11/30/2022 0  Not Estab. % Final   Neutrophils Absolute 11/30/2022 4.6  1.4 - 7.0 x10E3/uL Final   Lymphocytes Absolute 11/30/2022 2.2  0.7 - 3.1 x10E3/uL Final   Monocytes Absolute 11/30/2022 0.8  0.1 - 0.9 x10E3/uL Final   EOS (ABSOLUTE) 11/30/2022 0.1  0.0 - 0.4 x10E3/uL Final   Basophils Absolute 11/30/2022 0.0  0.0 - 0.2 x10E3/uL Final   Immature Granulocytes 11/30/2022 0  Not Estab. % Final   Immature Grans (Abs) 11/30/2022 0.0  0.0 - 0.1 x10E3/uL Final   Cholesterol, Total 11/30/2022 112  100 - 199 mg/dL Final   Triglycerides 21/30/8657 82  0 - 149 mg/dL Final   HDL 84/69/6295 52  >39 mg/dL Final   VLDL Cholesterol Cal 11/30/2022 16  5 - 40 mg/dL Final   LDL Chol Calc (NIH) 11/30/2022 44  0 - 99 mg/dL Final   Chol/HDL Ratio 11/30/2022 2.2  0.0 - 5.0 ratio Final   Comment:                                   T. Chol/HDL Ratio                                             Men  Women                               1/2 Avg.Risk  3.4    3.3                                    Avg.Risk  5.0    4.4                                2X Avg.Risk  9.6    7.1                                3X Avg.Risk 23.4   11.0   Admission on 10/28/2022, Discharged on 10/28/2022  Component Date Value Ref Range Status   WBC 10/28/2022 5.9  4.0 - 10.5 K/uL Final   RBC 10/28/2022 4.74  4.22 - 5.81 MIL/uL Final   Hemoglobin 10/28/2022 14.2  13.0 - 17.0 g/dL Final   HCT 13/04/6577 44.3  39.0 - 52.0 % Final   MCV 10/28/2022 93.5  80.0 - 100.0 fL Final   MCH 10/28/2022 30.0  26.0 - 34.0 pg Final   MCHC 10/28/2022 32.1  30.0 - 36.0 g/dL Final   RDW 46/96/2952 13.9  11.5 - 15.5 % Final   Platelets 10/28/2022 244  150 - 400 K/uL Final   nRBC 10/28/2022 0.0  0.0 - 0.2 % Final   Neutrophils Relative % 10/28/2022 72  % Final   Neutro Abs 10/28/2022 4.3  1.7 - 7.7 K/uL Final   Lymphocytes Relative 10/28/2022 19  % Final   Lymphs Abs 10/28/2022 1.1  0.7 - 4.0 K/uL Final   Monocytes Relative 10/28/2022 8  % Final   Monocytes Absolute 10/28/2022 0.5  0.1 - 1.0 K/uL Final   Eosinophils Relative 10/28/2022 1  % Final   Eosinophils Absolute 10/28/2022 0.0  0.0 - 0.5 K/uL Final   Basophils Relative 10/28/2022 0  % Final   Basophils Absolute 10/28/2022 0.0  0.0 - 0.1 K/uL Final   Immature Granulocytes 10/28/2022 0  % Final   Abs Immature Granulocytes 10/28/2022 0.01  0.00 - 0.07 K/uL Final   Performed at [facility], [address], [place], [place] [address]   Lipase 10/28/2022 137 (H)  11 - 51 U/L Final   Performed at [facility], [address], [place], [place] [address]   Opiates 10/28/2022 NONE DETECTED  NONE DETECTED Final   Cocaine 10/28/2022 POSITIVE (A)  NONE DETECTED Final   Benzodiazepines 10/28/2022 NONE DETECTED  NONE DETECTED Final   Amphetamines 10/28/2022 NONE DETECTED  NONE DETECTED Final   Tetrahydrocannabinol 10/28/2022 NONE DETECTED  NONE DETECTED Final   Barbiturates 10/28/2022 NONE DETECTED  NONE DETECTED Final   Comment: (NOTE) DRUG SCREEN FOR MEDICAL  PURPOSES ONLY.  IF CONFIRMATION IS NEEDED FOR ANY PURPOSE, NOTIFY LAB WITHIN 5 DAYS.  LOWEST DETECTABLE LIMITS FOR URINE DRUG SCREEN Drug Class                     Cutoff (ng/mL) Amphetamine and metabolites    1000 Barbiturate and metabolites    200 Benzodiazepine                 200 Opiates and metabolites        300 Cocaine and metabolites        300 THC                            50 Performed at [facility], [address], [place], [place] [address]    Alcohol, Ethyl (B) 10/28/2022 <10  <10 mg/dL Final   Comment: (NOTE) Lowest detectable limit for serum alcohol is 10 mg/dL.  For medical purposes only. Performed at [facility], [address] [place], [place] [address]    Sodium 10/28/2022 135  135 - 145 mmol/L Final   Potassium 10/28/2022 4.0  3.5 - 5.1 mmol/L Final   Chloride 10/28/2022 99  98 - 111 mmol/L Final   CO2 10/28/2022 23  22 - 32 mmol/L Final   Glucose, Bld 10/28/2022 273 (H)  70 - 99 mg/dL Final   Glucose reference range applies only to samples taken after fasting for at least 8 hours.   BUN 10/28/2022 7  6 - 20 mg/dL Final   Creatinine, Ser 10/28/2022 0.69  0.61 - 1.24 mg/dL Final   Calcium 16/06/9603 9.0  8.9 - 10.3 mg/dL Final   Total Protein 54/05/8118 7.2  6.5 - 8.1 g/dL Final   Albumin 14/78/2956 3.8  3.5 - 5.0 g/dL Final   AST 21/30/8657 22  15 - 41 U/L Final   ALT 10/28/2022 17  0 - 44 U/L Final   Alkaline Phosphatase 10/28/2022 65  38 - 126 U/L Final   Total Bilirubin 10/28/2022 0.3  0.3 - 1.2 mg/dL Final   GFR, Estimated 10/28/2022 >60  >60 mL/min Final   Comment: (NOTE) Calculated using the CKD-EPI Creatinine Equation (2021)    Anion gap 10/28/2022 13  5 - 15 Final   Performed at [facility], [address], [place], [place] [address]   Salicylate Lvl 10/28/2022 <7.0 (L)  7.0 - 30.0 mg/dL Final   Performed at [facility], [address], [place], [place] [address]   Acetaminophen (Tylenol), Serum 10/28/2022 <10  (L)  10 - 30 ug/mL Final   Comment: (NOTE) Therapeutic concentrations vary significantly. A range of 10-30 ug/mL  may be an effective concentration for many patients. However, some  are best treated at concentrations outside of this range. Acetaminophen concentrations >150 ug/mL at 4 hours after ingestion  and >50 ug/mL at 12 hours after ingestion are often associated with  toxic reactions.  Performed at [facility], [address], [place], [place] [address]   Admission on 10/10/2022, Discharged on 10/10/2022  Component Date Value Ref Range Status   Glucose-Capillary 10/10/2022 >600 (HH)  70 - 99 mg/dL Final   Glucose reference range applies only to samples taken after fasting for at least 8 hours.   Sodium 10/10/2022 129 (L)  135 - 145 mmol/L Final   Potassium 10/10/2022 4.4  3.5 - 5.1 mmol/L Final   Chloride 10/10/2022 95 (L)  98 - 111 mmol/L Final   CO2 10/10/2022 24  22 - 32 mmol/L Final   Glucose, Bld 10/10/2022 683 (HH)  70 - 99 mg/dL Final   Comment: CRITICAL RESULT CALLED TO, READ BACK BY AND VERIFIED WITH LAMB,S. RN AT 1219 10/10/22 MULLINS,T Glucose reference range applies only to samples taken after fasting for at least 8 hours.    BUN 10/10/2022 16  6 - 20 mg/dL Final   Creatinine, Ser 10/10/2022 0.93  0.61 - 1.24 mg/dL Final   Calcium 40/06/2724 8.4 (L)  8.9 - 10.3 mg/dL Final   Total Protein 36/64/4034 6.9  6.5 - 8.1 g/dL Final   Albumin 74/25/9563 3.6  3.5 - 5.0 g/dL Final   AST 87/56/4332 16  15 - 41 U/L Final   ALT 10/10/2022 11  0 - 44 U/L Final   Alkaline Phosphatase 10/10/2022 61  38 - 126 U/L Final   Total Bilirubin 10/10/2022 0.7  0.3 - 1.2 mg/dL Final   GFR, Estimated 10/10/2022 >60  >60 mL/min Final   Comment: (NOTE) Calculated using the CKD-EPI Creatinine Equation (2021)    Anion gap 10/10/2022 10  5 - 15 Final   Performed at [facility], [address], [place], [place] [address]   Color, Urine 10/10/2022 STRAW (A)  YELLOW  Final   APPearance 10/10/2022 CLEAR  CLEAR Final   Specific Gravity, Urine 10/10/2022 1.031 (H)  1.005 - 1.030 Final   pH 10/10/2022 5.0  5.0 - 8.0 Final   Glucose, UA 10/10/2022 >=500 (A)  NEGATIVE mg/dL Final   Hgb urine dipstick 10/10/2022 NEGATIVE  NEGATIVE Final   Bilirubin Urine 10/10/2022 NEGATIVE  NEGATIVE Final   Ketones, ur 10/10/2022 NEGATIVE  NEGATIVE mg/dL Final   Protein, ur 16/06/9603 NEGATIVE  NEGATIVE mg/dL Final   Nitrite 54/05/8118 NEGATIVE  NEGATIVE Final   Leukocytes,Ua 10/10/2022 NEGATIVE  NEGATIVE Final   RBC / HPF 10/10/2022 0-5  0 - 5 RBC/hpf Final   WBC, UA 10/10/2022 0-5  0 - 5 WBC/hpf Final   Bacteria, UA 10/10/2022 NONE SEEN  NONE SEEN Final   Squamous Epithelial / HPF 10/10/2022 0-5  0 - 5 /HPF Final   Performed at [facility], [address] [place], [place] [address]   Beta-Hydroxybutyric Acid 10/10/2022 0.41 (H)  0.05 - 0.27 mmol/L Final   Performed at [facility], [address] [place], [place] [address]   pH, Ven 10/10/2022 7.39  7.25 - 7.43 Final   pCO2, Ven 10/10/2022 42 (L)  44 - 60 mmHg Final   pO2, Ven 10/10/2022 59 (H)  32 - 45 mmHg Final   Bicarbonate 10/10/2022 25.4  20.0 - 28.0 mmol/L Final   Acid-Base Excess 10/10/2022 0.3  0.0 - 2.0 mmol/L Final   O2 Saturation 10/10/2022 92.1  % Final   Patient temperature 10/10/2022 37.0   Final   Performed at [facility], [address], [place], [place] [address]   Osmolality 10/10/2022 315 (H)  275 - 295 mOsm/kg Final   Comment: REPEATED TO VERIFY Performed at [facility], [address] [place], [place] [address]    WBC 10/10/2022 5.3  4.0 - 10.5 K/uL Final   RBC 10/10/2022 4.89  4.22 - 5.81 MIL/uL Final   Hemoglobin 10/10/2022 14.7  13.0 - 17.0 g/dL Final   HCT 62/95/2841 44.6  39.0 - 52.0 % Final   MCV 10/10/2022 91.2  80.0 - 100.0 fL Final   MCH 10/10/2022 30.1  26.0 - 34.0 pg Final   MCHC 10/10/2022 33.0  30.0 - 36.0  g/dL Final   RDW 32/44/0102 13.1  11.5 - 15.5 % Final   Platelets 10/10/2022 235  150 - 400 K/uL Final   nRBC 10/10/2022 0.0  0.0 - 0.2 % Final   Performed at [facility], [address] [place], [place] [address]   Glucose-Capillary 10/10/2022 480 (H)  70 - 99 mg/dL Final   Glucose reference range applies only to samples taken after fasting for at least 8 hours.    Blood Alcohol level:  Lab Results  Component Value Date   ETH <10 03/02/2023   ETH <10 10/28/2022    Metabolic Disorder Labs: Lab Results  Component Value Date   HGBA1C 10.9 (H) 03/02/2023   MPG 266.13 03/02/2023   MPG 231.69 03/22/2021   No results found for: "PROLACTIN" Lab Results  Component Value Date   CHOL 112 11/30/2022   TRIG 82 11/30/2022   HDL 52 11/30/2022   CHOLHDL 2.2 11/30/2022   VLDL 21 04/10/2017   LDLCALC 44 11/30/2022   LDLCALC 99 03/24/2021    Therapeutic Lab Levels: No results found for: "LITHIUM" No results found for: "VALPROATE" No results found for: "CBMZ"  Physical Findings   GAD-7    Flowsheet Row Office Visit from 11/30/2022 in [facility]  Total GAD-7 Score 12      PHQ2-9    Flowsheet Row ED from 03/03/2023 in [facility] Office Visit from 11/30/2022 in [facility] Office  Visit from 03/24/2021 in [facility] Office Visit from 08/23/2018 in [facility] Office Visit from 05/08/2017 in [facility]  PHQ-2 Total Score 2 0 0 0 0  PHQ-9 Total Score 3 3 -- -- --      Flowsheet Row ED from 03/03/2023 in [facility] ED from 03/01/2023 in [facility] ED from 02/05/2023 in [facility] at [facility]  C-SSRS RISK CATEGORY No Risk High Risk No Risk        Musculoskeletal  Strength & Muscle Tone: within normal limits Gait &  Station: normal Patient leans: N/A  Psychiatric Specialty Exam  Presentation  General Appearance:  Appropriate for Environment  Eye Contact: Good  Speech: Clear and Coherent  Speech Volume: Normal  Handedness: Right   Mood and Affect  Mood: Euthymic  Affect: Appropriate   Thought Process  Thought Processes: Coherent  Descriptions of Associations:Intact  Orientation:Full (Time, Place and Person)  Thought Content:Logical  Diagnosis of Schizophrenia or Schizoaffective disorder in past: No    Hallucinations:Hallucinations: None  Ideas of Reference:None  Suicidal Thoughts:Suicidal Thoughts: No  Homicidal Thoughts:Homicidal Thoughts: No   Sensorium  Memory: Immediate Good; Recent Good  Judgment: Good  Insight: Good   Executive Functions  Concentration: Good  Attention Span: Good  Recall: Good  Fund of Knowledge: Good  Language: Good   Psychomotor Activity  Psychomotor Activity: Psychomotor Activity: Normal   Assets  Assets: Communication Skills; Desire for Improvement   Sleep  Sleep: Sleep: Good   No data recorded  Physical Exam  Physical Exam Constitutional:      Appearance: Normal appearance.  HENT:     Head: Normocephalic and atraumatic.  Pulmonary:     Effort: Pulmonary effort is normal.  Neurological:     Mental Status: He is alert and oriented to person, place, and time.    ROS Blood pressure (!) 114/90, pulse 78, temperature 98.2 F (36.8 C), temperature source Oral, resp. rate 20, SpO2 99 %. There is no height or weight on file to calculate BMI.  Treatment Plan Summary: Daily contact with patient to assess and evaluate symptoms and progress in treatment  Patient doing well. No medication adjustments needed at this time.   #Cocaine Use Disorder #Substance-induced mood disorder #Prolonged Grief Disorder #Nicotine Use Disorder   Continue hydroxyzine 25 mg p.o. 3 times daily as needed Continue  trazodone 100 mg p.o. nightly as needed Continue gabapentin 300 mg p.o. 3 times daily Nicotine patch 14 mg daily PRN  #T2DM -Continue glipizide 5 mg p.o. daily -Continue insulin coverage Semglee 30 units q PM -Moderate SSI Last CBG: 198  #Hyperlipidemia Continue Lipitor 10 mg daily Continue to monitor BP for improvement   Patient encouraged to participate within the therapeutic milieu   Dispo: [place]uvia 7/2  PGY-4 Bobbye Morton, MD 03/10/2023 9:56 AM

## 2023-03-10 NOTE — ED Notes (Signed)
Patient A&Ox4. Denies intent to harm self/others when asked. Denies A/VH. Patient denies any physical complaints when asked. No acute distress noted. Pt pleasant and cooperativeSupport and encouragement provided. Routine safety checks conducted according to facility protocol. Encouraged patient to notify staff if thoughts of harm toward self or others arise. Patient verbalize understanding and agreement. Will continue to monitor for safety.

## 2023-03-11 DIAGNOSIS — F1410 Cocaine abuse, uncomplicated: Secondary | ICD-10-CM | POA: Diagnosis not present

## 2023-03-11 DIAGNOSIS — R45851 Suicidal ideations: Secondary | ICD-10-CM | POA: Diagnosis not present

## 2023-03-11 DIAGNOSIS — Z794 Long term (current) use of insulin: Secondary | ICD-10-CM | POA: Diagnosis not present

## 2023-03-11 DIAGNOSIS — Z5900 Homelessness unspecified: Secondary | ICD-10-CM | POA: Diagnosis not present

## 2023-03-11 DIAGNOSIS — Z560 Unemployment, unspecified: Secondary | ICD-10-CM | POA: Diagnosis not present

## 2023-03-11 DIAGNOSIS — E119 Type 2 diabetes mellitus without complications: Secondary | ICD-10-CM | POA: Diagnosis not present

## 2023-03-11 LAB — GLUCOSE, CAPILLARY
Glucose-Capillary: 153 mg/dL — ABNORMAL HIGH (ref 70–99)
Glucose-Capillary: 188 mg/dL — ABNORMAL HIGH (ref 70–99)
Glucose-Capillary: 171 mg/dL — ABNORMAL HIGH (ref 70–99)
Glucose-Capillary: 208 mg/dL — ABNORMAL HIGH (ref 70–99)

## 2023-03-11 NOTE — ED Notes (Signed)
Patient has been awake and alert all day.  He is calm, pleasant, organized and motivated for treatment.  He has been more aware of his diet since learning about diabetic dietary regimen and his finger sticks have been slowly coming down.  He is upbeat and positive about the life changes he wants to make.  Will monitor and provide safe environment.

## 2023-03-11 NOTE — ED Notes (Signed)
Patient is awake and alert on unit.  Presently talking on the phone.  No complaints or distress.  Will monitor.

## 2023-03-11 NOTE — ED Notes (Signed)
Pt observed/assessed in room sleeping. RR even and unlabored, appearing in no noted distress. Environmental check complete, will continue to monitor for safety 

## 2023-03-11 NOTE — Group Note (Signed)
Group Topic: Healthy Self Image and Positive Change  Group Date: 03/11/2023 Start Time: 1410 End Time: 1430 Facilitators: Jenean Lindau, RN  Department: [facility]  Number of Participants: 2  Group Focus: coping skills Treatment Modality:  Psychoeducation Interventions utilized were clarification and patient education Purpose: enhance coping skills, explore maladaptive thinking, express feelings, and express irrational fears  Name: Dakota Kim Date of Birth: 1970-12-02  MR: [id number]    Level of Participation: active Quality of Participation: attentive and cooperative Interactions with others: gave feedback Mood/Affect: appropriate Triggers (if applicable):   Cognition: coherent/clear and goal directed Progress: Gaining insight Response:   Plan: follow-up needed  Patients Problems:  Patient Active Problem List   Diagnosis Date Noted   Prolonged grief disorder 03/05/2023   Polysubstance abuse (HCC) 03/03/2023   Substance induced mood disorder (HCC) 03/03/2023   Foot infection 11/30/2022   Hyperlipidemia 02/26/2019   Class 2 severe obesity due to excess calories with serious comorbidity and body mass index (BMI) of 35.0 to 35.9 in adult [facility]) 02/26/2019   Type 2 diabetes mellitus without complication, with long-term current use of insulin (HCC) 05/13/2017   Hypokalemia 11/06/2012

## 2023-03-11 NOTE — Group Note (Signed)
Group Topic: Relaxation  Group Date: 03/11/2023 Start Time: 1000 End Time: 1045 Facilitators: Vonzell Schlatter B  Department: [facility]  Number of Participants: 3  Group Focus: relaxation Treatment Modality:  Psychoeducation Interventions utilized were group exercise Purpose: express feelings  Name: Dakota Kim Date of Birth: June 15, 1971  MR: [id number]    Level of Participation: active Quality of Participation: attentive and cooperative Interactions with others: gave feedback Mood/Affect: positive Triggers (if applicable): n/a Cognition: coherent/clear Progress: Moderate Response: n/a Plan: follow-up needed  Patients Problems:  Patient Active Problem List   Diagnosis Date Noted   Prolonged grief disorder 03/05/2023   Polysubstance abuse (HCC) 03/03/2023   Substance induced mood disorder (HCC) 03/03/2023   Foot infection 11/30/2022   Hyperlipidemia 02/26/2019   Class 2 severe obesity due to excess calories with serious comorbidity and body mass index (BMI) of 35.0 to 35.9 in adult [facility]) 02/26/2019   Type 2 diabetes mellitus without complication, with long-term current use of insulin (HCC) 05/13/2017   Hypokalemia 11/06/2012

## 2023-03-11 NOTE — ED Notes (Signed)
Patient continues to rest with no sxs of distress noted - will continue to monitor for safety 

## 2023-03-11 NOTE — ED Provider Notes (Signed)
Behavioral Health Progress Note  Date and Time: 03/11/2023 10:48 AM Name: Jessica Ostrand MRN:  [id number]  Subjective:  Jonn Milia is a 52 year old male with no formal psychiatric diagnoses prior to admission but who meets criteria for cocaine use disorder, nicotine use disorder, and prolonged grief who presented voluntarily to [facility] for detox and substance use treatment, for which he was admitted to [facility].   Patient doing well this AM. Patient reports that he is in a good mood, slept well, and has a good appetite. Patient denies SI, HI, and AVH. Patient continues to deny feelings of worthlessness, irritability, or overly anxious. Patient discusses today that he is ready to go to [place] and he has been creating a plan for once he arrives and thinking about how to approach his transition once he completes the program. Patient endorses a lot of time to reflect reporting that he has really enjoyed the [employer] Patient reports that he has really thought about how his grief and anger at his father "leaving me" when he died, directly led to him making the choice to relapse and making the choice to ignore advice from family. Patient reports that he does not intend to return to [facility],  due to ease in access, but he may return once to say thank you for help. He would like to start anew in [place] and has reframed his his thoughts to be more positive, but not delusional in the work that lies ahead for return to a sober life. Patient endorses speaking with family last night and feeling very supported.   Diagnosis:  Final diagnoses:  Substance induced mood disorder (HCC)  Polysubstance abuse (HCC)  Prolonged grief disorder    Total Time spent with patient: 20 minutes  Past Psychiatric History: See HPI Past Medical History: See HPI Family History: See HPI Family Psychiatric  History: See HPI Social History: See HPI  Additional Social History:                         Sleep:  Good  Appetite:  Good  Current Medications:  Current Facility-Administered Medications  Medication Dose Route Frequency Provider Last Rate Last Admin   acetaminophen (TYLENOL) tablet 650 mg  650 mg Oral Q6H PRN Rankin, Shuvon B, NP   650 mg at 03/06/23 2033   alum & mag hydroxide-simeth (MAALOX/MYLANTA) 200-200-20 MG/5ML suspension 30 mL  30 mL Oral Q4H PRN Rankin, Shuvon B, NP       atorvastatin (LIPITOR) tablet 10 mg  10 mg Oral Daily Rankin, Shuvon B, NP   10 mg at 03/11/23 1005   gabapentin (NEURONTIN) capsule 300 mg  300 mg Oral BID Rankin, Shuvon B, NP   300 mg at 03/11/23 1005   glipiZIDE (GLUCOTROL) tablet 5 mg  5 mg Oral QAC breakfast Rankin, Shuvon B, NP   5 mg at 03/11/23 0746   insulin aspart (novoLOG) injection 0-15 Units  0-15 Units Subcutaneous TID WC Rankin, Shuvon B, NP   3 Units at 03/11/23 0747   insulin aspart (novoLOG) injection 0-5 Units  0-5 Units Subcutaneous QHS Rankin, Shuvon B, NP   2 Units at 03/10/23 2159   insulin glargine-yfgn (SEMGLEE) injection 30 Units  30 Units Subcutaneous QHS Lamar Sprinkles, MD   30 Units at 03/10/23 2158   nicotine (NICODERM CQ - dosed in mg/24 hours) patch 14 mg  14 mg Transdermal Daily PRN Lamar Sprinkles, MD       polyethylene glycol (MIRALAX /  GLYCOLAX) packet 17 g  17 g Oral Daily PRN Ajibola, Ene A, NP   17 g at 03/10/23 2154   traZODone (DESYREL) tablet 100-150 mg  100-150 mg Oral QHS PRN Lamar Sprinkles, MD   100 mg at 03/10/23 2154   Current Outpatient Medications  Medication Sig Dispense Refill   atorvastatin (LIPITOR) 10 MG tablet TAKE 1 TABLET (10 MG TOTAL) BY MOUTH DAILY. (Patient not taking: Reported on 03/02/2023) 30 tablet 11   gabapentin (NEURONTIN) 300 MG capsule Take 1 capsule (300 mg total) by mouth 2 (two) times daily. (Patient not taking: Reported on 03/02/2023) 60 capsule 2   glipiZIDE (GLUCOTROL) 5 MG tablet Take 1 tablet (5 mg total) by mouth daily before breakfast. (Patient not taking: Reported on 03/02/2023) 60  tablet 0   hydrOXYzine (ATARAX) 25 MG tablet Take 1 tablet (25 mg total) by mouth 3 (three) times daily as needed for anxiety. 30 tablet 0   insulin aspart (NOVOLOG) 100 UNIT/ML injection Inject 10 Units into the skin 3 (three) times daily with meals. 10 mL 11   insulin aspart (NOVOLOG) 100 UNIT/ML injection Inject 0-15 Units into the skin 3 (three) times daily with meals. 10 mL 11   insulin aspart (NOVOLOG) 100 UNIT/ML injection Inject 0-5 Units into the skin at bedtime. 10 mL 11   Insulin Glargine (BASAGLAR KWIKPEN) 100 UNIT/ML Inject 15 Units into the skin at bedtime. (Patient taking differently: Inject 20 Units into the skin at bedtime.) 15 mL 1   insulin glargine-yfgn (SEMGLEE) 100 UNIT/ML injection Inject 0.2 mLs (20 Units total) into the skin daily. 10 mL 11   traZODone (DESYREL) 50 MG tablet Take 1 tablet (50 mg total) by mouth at bedtime as needed for sleep.      Labs  Lab Results:  Admission on 03/03/2023  Component Date Value Ref Range Status   Glucose-Capillary 03/03/2023 287 (H)  70 - 99 mg/dL Final   Glucose reference range applies only to samples taken after fasting for at least 8 hours.   Glucose-Capillary 03/04/2023 252 (H)  70 - 99 mg/dL Final   Glucose reference range applies only to samples taken after fasting for at least 8 hours.   Glucose-Capillary 03/04/2023 341 (H)  70 - 99 mg/dL Final   Glucose reference range applies only to samples taken after fasting for at least 8 hours.   Glucose-Capillary 03/04/2023 286 (H)  70 - 99 mg/dL Final   Glucose reference range applies only to samples taken after fasting for at least 8 hours.   Glucose-Capillary 03/03/2023 175 (H)  70 - 99 mg/dL Final   Glucose reference range applies only to samples taken after fasting for at least 8 hours.   Glucose-Capillary 03/04/2023 182 (H)  70 - 99 mg/dL Final   Glucose reference range applies only to samples taken after fasting for at least 8 hours.   Glucose-Capillary 03/05/2023 254 (H)  70  - 99 mg/dL Final   Glucose reference range applies only to samples taken after fasting for at least 8 hours.   Glucose-Capillary 03/05/2023 163 (H)  70 - 99 mg/dL Final   Glucose reference range applies only to samples taken after fasting for at least 8 hours.   Glucose-Capillary 03/05/2023 242 (H)  70 - 99 mg/dL Final   Glucose reference range applies only to samples taken after fasting for at least 8 hours.   Glucose-Capillary 03/05/2023 387 (H)  70 - 99 mg/dL Final   Glucose reference range applies only to  samples taken after fasting for at least 8 hours.   Glucose-Capillary 03/06/2023 232 (H)  70 - 99 mg/dL Final   Glucose reference range applies only to samples taken after fasting for at least 8 hours.   Glucose-Capillary 03/06/2023 231 (H)  70 - 99 mg/dL Final   Glucose reference range applies only to samples taken after fasting for at least 8 hours.   Glucose-Capillary 03/06/2023 296 (H)  70 - 99 mg/dL Final   Glucose reference range applies only to samples taken after fasting for at least 8 hours.   Glucose-Capillary 03/06/2023 257 (H)  70 - 99 mg/dL Final   Glucose reference range applies only to samples taken after fasting for at least 8 hours.   Glucose-Capillary 03/07/2023 269 (H)  70 - 99 mg/dL Final   Glucose reference range applies only to samples taken after fasting for at least 8 hours.   Glucose-Capillary 03/06/2023 250 (H)  70 - 99 mg/dL Final   Glucose reference range applies only to samples taken after fasting for at least 8 hours.   Glucose-Capillary 03/07/2023 256 (H)  70 - 99 mg/dL Final   Glucose reference range applies only to samples taken after fasting for at least 8 hours.   Glucose-Capillary 03/07/2023 237 (H)  70 - 99 mg/dL Final   Glucose reference range applies only to samples taken after fasting for at least 8 hours.   Glucose-Capillary 03/07/2023 270 (H)  70 - 99 mg/dL Final   Glucose reference range applies only to samples taken after fasting for at least  8 hours.   Glucose-Capillary 03/08/2023 230 (H)  70 - 99 mg/dL Final   Glucose reference range applies only to samples taken after fasting for at least 8 hours.   Glucose-Capillary 03/08/2023 188 (H)  70 - 99 mg/dL Final   Glucose reference range applies only to samples taken after fasting for at least 8 hours.   Glucose-Capillary 03/08/2023 129 (H)  70 - 99 mg/dL Final   Glucose reference range applies only to samples taken after fasting for at least 8 hours.   Glucose-Capillary 03/08/2023 316 (H)  70 - 99 mg/dL Final   Glucose reference range applies only to samples taken after fasting for at least 8 hours.   Glucose-Capillary 03/09/2023 207 (H)  70 - 99 mg/dL Final   Glucose reference range applies only to samples taken after fasting for at least 8 hours.   Glucose-Capillary 03/09/2023 155 (H)  70 - 99 mg/dL Final   Glucose reference range applies only to samples taken after fasting for at least 8 hours.   Glucose-Capillary 03/09/2023 203 (H)  70 - 99 mg/dL Final   Glucose reference range applies only to samples taken after fasting for at least 8 hours.   Glucose-Capillary 03/09/2023 270 (H)  70 - 99 mg/dL Final   Glucose reference range applies only to samples taken after fasting for at least 8 hours.   Comment 1 03/09/2023 RN  NOTIEFIED   Final   Glucose-Capillary 03/10/2023 198 (H)  70 - 99 mg/dL Final   Glucose reference range applies only to samples taken after fasting for at least 8 hours.   Glucose-Capillary 03/10/2023 200 (H)  70 - 99 mg/dL Final   Glucose reference range applies only to samples taken after fasting for at least 8 hours.   Glucose-Capillary 03/10/2023 194 (H)  70 - 99 mg/dL Final   Glucose reference range applies only to samples taken after fasting for at least 8  hours.   Glucose-Capillary 03/10/2023 205 (H)  70 - 99 mg/dL Final   Glucose reference range applies only to samples taken after fasting for at least 8 hours.   Comment 1 03/10/2023 RN AWARE   Final    Glucose-Capillary 03/11/2023 153 (H)  70 - 99 mg/dL Final   Glucose reference range applies only to samples taken after fasting for at least 8 hours.  Admission on 03/01/2023, Discharged on 03/03/2023  Component Date Value Ref Range Status   WBC 03/02/2023 5.7  4.0 - 10.5 K/uL Final   RBC 03/02/2023 4.71  4.22 - 5.81 MIL/uL Final   Hemoglobin 03/02/2023 13.7  13.0 - 17.0 g/dL Final   HCT 16/06/9603 43.7  39.0 - 52.0 % Final   MCV 03/02/2023 92.8  80.0 - 100.0 fL Final   MCH 03/02/2023 29.1  26.0 - 34.0 pg Final   MCHC 03/02/2023 31.4  30.0 - 36.0 g/dL Final   RDW 54/05/8118 14.1  11.5 - 15.5 % Final   Platelets 03/02/2023 256  150 - 400 K/uL Final   nRBC 03/02/2023 0.0  0.0 - 0.2 % Final   Neutrophils Relative % 03/02/2023 53  % Final   Neutro Abs 03/02/2023 3.1  1.7 - 7.7 K/uL Final   Lymphocytes Relative 03/02/2023 32  % Final   Lymphs Abs 03/02/2023 1.8  0.7 - 4.0 K/uL Final   Monocytes Relative 03/02/2023 11  % Final   Monocytes Absolute 03/02/2023 0.6  0.1 - 1.0 K/uL Final   Eosinophils Relative 03/02/2023 2  % Final   Eosinophils Absolute 03/02/2023 0.1  0.0 - 0.5 K/uL Final   Basophils Relative 03/02/2023 1  % Final   Basophils Absolute 03/02/2023 0.0  0.0 - 0.1 K/uL Final   Immature Granulocytes 03/02/2023 1  % Final   Abs Immature Granulocytes 03/02/2023 0.04  0.00 - 0.07 K/uL Final   Performed at [facility], [address] [place], [place] [address]   Sodium 03/02/2023 137  135 - 145 mmol/L Final   Potassium 03/02/2023 5.0  3.5 - 5.1 mmol/L Final   Chloride 03/02/2023 100  98 - 111 mmol/L Final   CO2 03/02/2023 27  22 - 32 mmol/L Final   Glucose, Bld 03/02/2023 424 (H)  70 - 99 mg/dL Final   Glucose reference range applies only to samples taken after fasting for at least 8 hours.   BUN 03/02/2023 14  6 - 20 mg/dL Final   Creatinine, Ser 03/02/2023 0.90  0.61 - 1.24 mg/dL Final   Calcium 95/62/1308 9.3  8.9 - 10.3 mg/dL Final   Total Protein 65/78/4696 6.5  6.5 -  8.1 g/dL Final   Albumin 29/52/8413 3.6  3.5 - 5.0 g/dL Final   AST 24/40/1027 13 (L)  15 - 41 U/L Final   ALT 03/02/2023 16  0 - 44 U/L Final   Alkaline Phosphatase 03/02/2023 65  38 - 126 U/L Final   Total Bilirubin 03/02/2023 0.5  0.3 - 1.2 mg/dL Final   GFR, Estimated 03/02/2023 >60  >60 mL/min Final   Comment: (NOTE) Calculated using the CKD-EPI Creatinine Equation (2021)    Anion gap 03/02/2023 10  5 - 15 Final   Performed at [facility], [address], [place], [place] [address]   Hgb A1c MFr Bld 03/02/2023 10.9 (H)  4.8 - 5.6 % Final   Comment: (NOTE) Pre diabetes:          5.7%-6.4%  Diabetes:              >  6.4%  Glycemic control for   <7.0% adults with diabetes    Mean Plasma Glucose 03/02/2023 266.13  mg/dL Final   Performed at [facility], [address], [place], [place] [address]   Alcohol, Ethyl (B) 03/02/2023 <10  <10 mg/dL Final   Comment: (NOTE) Lowest detectable limit for serum alcohol is 10 mg/dL.  For medical purposes only. Performed at [facility], [address], [place], [place] [address]    TSH 03/02/2023 1.178  0.350 - 4.500 uIU/mL Final   Comment: Performed by a 3rd Generation assay with a functional sensitivity of <=0.01 uIU/mL. Performed at [facility], [address] [place], [place] [address]    POC Amphetamine UR 03/02/2023 None Detected  NONE DETECTED (Cut Off Level 1000 ng/mL) Final   POC Secobarbital (BAR) 03/02/2023 None Detected  NONE DETECTED (Cut Off Level 300 ng/mL) Final   POC Buprenorphine (BUP) 03/02/2023 None Detected  NONE DETECTED (Cut Off Level 10 ng/mL) Final   POC Oxazepam (BZO) 03/02/2023 None Detected  NONE DETECTED (Cut Off Level 300 ng/mL) Final   POC Cocaine UR 03/02/2023 Positive (A)  NONE DETECTED (Cut Off Level 300 ng/mL) Final   POC Methamphetamine UR 03/02/2023 None Detected  NONE DETECTED (Cut Off Level 1000 ng/mL) Final   POC Morphine 03/02/2023 None Detected  NONE DETECTED (Cut Off  Level 300 ng/mL) Final   POC Methadone UR 03/02/2023 None Detected  NONE DETECTED (Cut Off Level 300 ng/mL) Final   POC Oxycodone UR 03/02/2023 None Detected  NONE DETECTED (Cut Off Level 100 ng/mL) Final   POC Marijuana UR 03/02/2023 Positive (A)  NONE DETECTED (Cut Off Level 50 ng/mL) Final   Glucose-Capillary 03/02/2023 499 (H)  70 - 99 mg/dL Final   Glucose reference range applies only to samples taken after fasting for at least 8 hours.   Glucose-Capillary 03/02/2023 404 (H)  70 - 99 mg/dL Final   Glucose reference range applies only to samples taken after fasting for at least 8 hours.   Glucose-Capillary 03/02/2023 214 (H)  70 - 99 mg/dL Final   Glucose reference range applies only to samples taken after fasting for at least 8 hours.   Glucose-Capillary 03/02/2023 265 (H)  70 - 99 mg/dL Final   Glucose reference range applies only to samples taken after fasting for at least 8 hours.   Glucose-Capillary 03/03/2023 263 (H)  70 - 99 mg/dL Final   Glucose reference range applies only to samples taken after fasting for at least 8 hours.   Glucose-Capillary 03/03/2023 258 (H)  70 - 99 mg/dL Final   Glucose reference range applies only to samples taken after fasting for at least 8 hours.  Admission on 02/05/2023, Discharged on 02/05/2023  Component Date Value Ref Range Status   Glucose-Capillary 02/05/2023 380 (H)  70 - 99 mg/dL Final   Glucose reference range applies only to samples taken after fasting for at least 8 hours.   Sodium 02/05/2023 138  135 - 145 mmol/L Final   Potassium 02/05/2023 4.0  3.5 - 5.1 mmol/L Final   Chloride 02/05/2023 103  98 - 111 mmol/L Final   CO2 02/05/2023 26  22 - 32 mmol/L Final   Glucose, Bld 02/05/2023 380 (H)  70 - 99 mg/dL Final   Glucose reference range applies only to samples taken after fasting for at least 8 hours.   BUN 02/05/2023 11  6 - 20 mg/dL Final   Creatinine, Ser 02/05/2023 0.92  0.61 - 1.24 mg/dL Final   Calcium 16/06/9603 9.1  8.9 - 10.3  mg/dL Final   GFR, Estimated 02/05/2023 >60  >60 mL/min Final   Comment: (NOTE) Calculated using the CKD-EPI Creatinine Equation (2021)    Anion gap 02/05/2023 9  5 - 15 Final   Performed at [facility], [address], [place], [place] [address]   WBC 02/05/2023 7.2  4.0 - 10.5 K/uL Final   RBC 02/05/2023 5.40  4.22 - 5.81 MIL/uL Final   Hemoglobin 02/05/2023 16.0  13.0 - 17.0 g/dL Final   HCT 11/91/4782 49.7  39.0 - 52.0 % Final   MCV 02/05/2023 92.0  80.0 - 100.0 fL Final   MCH 02/05/2023 29.6  26.0 - 34.0 pg Final   MCHC 02/05/2023 32.2  30.0 - 36.0 g/dL Final   RDW 95/62/1308 13.9  11.5 - 15.5 % Final   Platelets 02/05/2023 266  150 - 400 K/uL Final   nRBC 02/05/2023 0.0  0.0 - 0.2 % Final   Performed at [facility], [address] [place], [place] [address]   Glucose-Capillary 02/05/2023 342 (H)  70 - 99 mg/dL Final   Glucose reference range applies only to samples taken after fasting for at least 8 hours.   Total Protein 02/05/2023 7.5  6.5 - 8.1 g/dL Final   Albumin 69/62/9528 4.1  3.5 - 5.0 g/dL Final   AST 41/32/4401 16  15 - 41 U/L Final   ALT 02/05/2023 12  0 - 44 U/L Final   Alkaline Phosphatase 02/05/2023 83  38 - 126 U/L Final   Total Bilirubin 02/05/2023 1.2  0.3 - 1.2 mg/dL Final   Bilirubin, Direct 02/05/2023 0.3 (H)  0.0 - 0.2 mg/dL Final   Indirect Bilirubin 02/05/2023 0.9  0.3 - 0.9 mg/dL Final   Performed at [facility], [address] [place], [place] [address]   Lipase 02/05/2023 36  11 - 51 U/L Final   Performed at [facility], [address], [place], [place] [address]   pH, Ven 02/05/2023 7.352  7.25 - 7.43 Final   pCO2, Ven 02/05/2023 48.6  44 - 60 mmHg Final   pO2, Ven 02/05/2023 28 (LL)  32 - 45 mmHg Final   Bicarbonate 02/05/2023 26.9  20.0 - 28.0 mmol/L Final   TCO2 02/05/2023 28  22 - 32 mmol/L Final   O2 Saturation 02/05/2023 49  % Final   Acid-Base Excess 02/05/2023 0.0  0.0 - 2.0 mmol/L Final   Sodium 02/05/2023  140  135 - 145 mmol/L Final   Potassium 02/05/2023 3.9  3.5 - 5.1 mmol/L Final   Calcium, Ion 02/05/2023 1.22  1.15 - 1.40 mmol/L Final   HCT 02/05/2023 50.0  39.0 - 52.0 % Final   Hemoglobin 02/05/2023 17.0  13.0 - 17.0 g/dL Final   Sample type 03/47/4259 VENOUS   Final   Comment 02/05/2023 NOTIFIED PHYSICIAN   Final   Beta-Hydroxybutyric Acid 02/05/2023 0.21  0.05 - 0.27 mmol/L Final   Performed at [facility], [address], [place], [place] [address]   Glucose-Capillary 02/05/2023 213 (H)  70 - 99 mg/dL Final   Glucose reference range applies only to samples taken after fasting for at least 8 hours.  Admission on 01/27/2023, Discharged on 01/27/2023  Component Date Value Ref Range Status   Glucose-Capillary 01/27/2023 454 (H)  70 - 99 mg/dL Final   Glucose reference range applies only to samples taken  after fasting for at least 8 hours.   WBC 01/27/2023 5.3  4.0 - 10.5 K/uL Final   RBC 01/27/2023 4.77  4.22 - 5.81 MIL/uL Final   Hemoglobin 01/27/2023 14.2  13.0 - 17.0 g/dL Final   HCT 16/06/9603 43.5  39.0 - 52.0 % Final   MCV 01/27/2023 91.2  80.0 - 100.0 fL Final   MCH 01/27/2023 29.8  26.0 - 34.0 pg Final   MCHC 01/27/2023 32.6  30.0 - 36.0 g/dL Final   RDW 54/05/8118 13.9  11.5 - 15.5 % Final   Platelets 01/27/2023 238  150 - 400 K/uL Final   nRBC 01/27/2023 0.0  0.0 - 0.2 % Final   Neutrophils Relative % 01/27/2023 68  % Final   Neutro Abs 01/27/2023 3.5  1.7 - 7.7 K/uL Final   Lymphocytes Relative 01/27/2023 23  % Final   Lymphs Abs 01/27/2023 1.2  0.7 - 4.0 K/uL Final   Monocytes Relative 01/27/2023 9  % Final   Monocytes Absolute 01/27/2023 0.5  0.1 - 1.0 K/uL Final   Eosinophils Relative 01/27/2023 0  % Final   Eosinophils Absolute 01/27/2023 0.0  0.0 - 0.5 K/uL Final   Basophils Relative 01/27/2023 0  % Final   Basophils Absolute 01/27/2023 0.0  0.0 - 0.1 K/uL Final   Immature Granulocytes 01/27/2023 0  % Final   Abs Immature Granulocytes 01/27/2023 0.02   0.00 - 0.07 K/uL Final   Performed at [facility], [address] [place], [place] [address]   Sodium 01/27/2023 133 (L)  135 - 145 mmol/L Final   Potassium 01/27/2023 4.5  3.5 - 5.1 mmol/L Final   Chloride 01/27/2023 99  98 - 111 mmol/L Final   CO2 01/27/2023 25  22 - 32 mmol/L Final   Glucose, Bld 01/27/2023 410 (H)  70 - 99 mg/dL Final   Glucose reference range applies only to samples taken after fasting for at least 8 hours.   BUN 01/27/2023 17  6 - 20 mg/dL Final   Creatinine, Ser 01/27/2023 0.89  0.61 - 1.24 mg/dL Final   Calcium 95/62/1308 8.5 (L)  8.9 - 10.3 mg/dL Final   Total Protein 65/78/4696 6.8  6.5 - 8.1 g/dL Final   Albumin 29/52/8413 3.6  3.5 - 5.0 g/dL Final   AST 24/40/1027 19  15 - 41 U/L Final   ALT 01/27/2023 15  0 - 44 U/L Final   Alkaline Phosphatase 01/27/2023 60  38 - 126 U/L Final   Total Bilirubin 01/27/2023 0.7  0.3 - 1.2 mg/dL Final   GFR, Estimated 01/27/2023 >60  >60 mL/min Final   Comment: (NOTE) Calculated using the CKD-EPI Creatinine Equation (2021)    Anion gap 01/27/2023 9  5 - 15 Final   Performed at [facility], [address] [place], [place] [address]   Glucose-Capillary 01/27/2023 339 (H)  70 - 99 mg/dL Final   Glucose reference range applies only to samples taken after fasting for at least 8 hours.  Admission on 12/23/2022, Discharged on 12/23/2022  Component Date Value Ref Range Status   Glucose-Capillary 12/23/2022 339 (H)  70 - 99 mg/dL Final   Glucose reference range applies only to samples taken after fasting for at least 8 hours.   Comment 1 12/23/2022 Notify RN   Final   Sodium 12/23/2022 132 (L)  135 - 145 mmol/L Final   Potassium 12/23/2022 4.0  3.5 - 5.1 mmol/L Final   Chloride 12/23/2022 102  98 -  111 mmol/L Final   CO2 12/23/2022 26  22 - 32 mmol/L Final   Glucose, Bld 12/23/2022 351 (H)  70 - 99 mg/dL Final   Glucose reference range applies only to samples taken after fasting for at  least 8 hours.   BUN 12/23/2022 13  6 - 20 mg/dL Final   Creatinine, Ser 12/23/2022 0.70  0.61 - 1.24 mg/dL Final   Calcium 56/21/3086 8.3 (L)  8.9 - 10.3 mg/dL Final   GFR, Estimated 12/23/2022 >60  >60 mL/min Final   Comment: (NOTE) Calculated using the CKD-EPI Creatinine Equation (2021)    Anion gap 12/23/2022 4 (L)  5 - 15 Final   Performed at [facility], [address] [place], [place] [address]   WBC 12/23/2022 6.9  4.0 - 10.5 K/uL Final   RBC 12/23/2022 4.60  4.22 - 5.81 MIL/uL Final   Hemoglobin 12/23/2022 13.6  13.0 - 17.0 g/dL Final   HCT 96/29/5284 42.7  39.0 - 52.0 % Final   MCV 12/23/2022 92.8  80.0 - 100.0 fL Final   MCH 12/23/2022 29.6  26.0 - 34.0 pg Final   MCHC 12/23/2022 31.9  30.0 - 36.0 g/dL Final   RDW 13/24/4010 13.4  11.5 - 15.5 % Final   Platelets 12/23/2022 268  150 - 400 K/uL Final   nRBC 12/23/2022 0.0  0.0 - 0.2 % Final   Performed at [facility], [address] [place], [place] [address]   Color, Urine 12/23/2022 YELLOW  YELLOW Final   APPearance 12/23/2022 CLEAR  CLEAR Final   Specific Gravity, Urine 12/23/2022 1.026  1.005 - 1.030 Final   pH 12/23/2022 7.0  5.0 - 8.0 Final   Glucose, UA 12/23/2022 >=500 (A)  NEGATIVE mg/dL Final   Hgb urine dipstick 12/23/2022 NEGATIVE  NEGATIVE Final   Bilirubin Urine 12/23/2022 NEGATIVE  NEGATIVE Final   Ketones, ur 12/23/2022 NEGATIVE  NEGATIVE mg/dL Final   Protein, ur 66/44/0347 NEGATIVE  NEGATIVE mg/dL Final   Nitrite 42/59/5638 NEGATIVE  NEGATIVE Final   Leukocytes,Ua 12/23/2022 NEGATIVE  NEGATIVE Final   RBC / HPF 12/23/2022 0-5  0 - 5 RBC/hpf Final   WBC, UA 12/23/2022 0-5  0 - 5 WBC/hpf Final   Bacteria, UA 12/23/2022 NONE SEEN  NONE SEEN Final   Squamous Epithelial / HPF 12/23/2022 0-5  0 - 5 /HPF Final   Performed at [facility], [address] [place], [place] [address]   Glucose-Capillary 12/23/2022 295 (H)  70 - 99 mg/dL Final   Glucose  reference range applies only to samples taken after fasting for at least 8 hours.   Glucose-Capillary 12/23/2022 231 (H)  70 - 99 mg/dL Final   Glucose reference range applies only to samples taken after fasting for at least 8 hours.  Office Visit on 11/30/2022  Component Date Value Ref Range Status   Creatinine, Urine 11/30/2022 108.2  Not Estab. mg/dL Final   Microalbumin, Urine 11/30/2022 12.1  Not Estab. ug/mL Final   Microalb/Creat Ratio 11/30/2022 11  0 - 29 mg/g creat Final   Comment:                        Normal:                0 -  29                        Moderately  increased: 30 - 300                        Severely increased:       >300    Glucose 11/30/2022 49 (L)  70 - 99 mg/dL Final   BUN 65/78/4696 11  6 - 24 mg/dL Final   Creatinine, Ser 11/30/2022 0.77  0.76 - 1.27 mg/dL Final   eGFR 29/52/8413 108  >59 mL/min/1.73 Final   BUN/Creatinine Ratio 11/30/2022 14  9 - 20 Final   Sodium 11/30/2022 144  134 - 144 mmol/L Final   Potassium 11/30/2022 5.2  3.5 - 5.2 mmol/L Final   Chloride 11/30/2022 104  96 - 106 mmol/L Final   CO2 11/30/2022 26  20 - 29 mmol/L Final   Calcium 11/30/2022 9.3  8.7 - 10.2 mg/dL Final   Total Protein 24/40/1027 7.0  6.0 - 8.5 g/dL Final   Albumin 25/36/6440 4.2  3.8 - 4.9 g/dL Final   Globulin, Total 11/30/2022 2.8  1.5 - 4.5 g/dL Final   Albumin/Globulin Ratio 11/30/2022 1.5  1.2 - 2.2 Final   Bilirubin Total 11/30/2022 0.2  0.0 - 1.2 mg/dL Final   Alkaline Phosphatase 11/30/2022 66  44 - 121 IU/L Final   AST 11/30/2022 16  0 - 40 IU/L Final   ALT 11/30/2022 13  0 - 44 IU/L Final   Hemoglobin A1C 11/30/2022 9.0 (A)  4.0 - 5.6 % Final   POC Glucose 11/30/2022 60 (A)  70 - 99 mg/dl Final   WBC 34/74/2595 7.7  3.4 - 10.8 x10E3/uL Final   RBC 11/30/2022 4.94  4.14 - 5.80 x10E6/uL Final   Hemoglobin 11/30/2022 14.6  13.0 - 17.7 g/dL Final   Hematocrit 63/87/5643 45.2  37.5 - 51.0 % Final   MCV 11/30/2022 92  79 - 97 fL Final   MCH 11/30/2022  29.6  26.6 - 33.0 pg Final   MCHC 11/30/2022 32.3  31.5 - 35.7 g/dL Final   RDW 32/95/1884 12.7  11.6 - 15.4 % Final   Platelets 11/30/2022 269  150 - 450 x10E3/uL Final   Neutrophils 11/30/2022 61  Not Estab. % Final   Lymphs 11/30/2022 28  Not Estab. % Final   Monocytes 11/30/2022 10  Not Estab. % Final   Eos 11/30/2022 1  Not Estab. % Final   Basos 11/30/2022 0  Not Estab. % Final   Neutrophils Absolute 11/30/2022 4.6  1.4 - 7.0 x10E3/uL Final   Lymphocytes Absolute 11/30/2022 2.2  0.7 - 3.1 x10E3/uL Final   Monocytes Absolute 11/30/2022 0.8  0.1 - 0.9 x10E3/uL Final   EOS (ABSOLUTE) 11/30/2022 0.1  0.0 - 0.4 x10E3/uL Final   Basophils Absolute 11/30/2022 0.0  0.0 - 0.2 x10E3/uL Final   Immature Granulocytes 11/30/2022 0  Not Estab. % Final   Immature Grans (Abs) 11/30/2022 0.0  0.0 - 0.1 x10E3/uL Final   Cholesterol, Total 11/30/2022 112  100 - 199 mg/dL Final   Triglycerides 16/60/6301 82  0 - 149 mg/dL Final   HDL 60/06/9322 52  >39 mg/dL Final   VLDL Cholesterol Cal 11/30/2022 16  5 - 40 mg/dL Final   LDL Chol Calc (NIH) 11/30/2022 44  0 - 99 mg/dL Final   Chol/HDL Ratio 11/30/2022 2.2  0.0 - 5.0 ratio Final   Comment:  T. Chol/HDL Ratio                                             Men  Women                               1/2 Avg.Risk  3.4    3.3                                   Avg.Risk  5.0    4.4                                2X Avg.Risk  9.6    7.1                                3X Avg.Risk 23.4   11.0   Admission on 10/28/2022, Discharged on 10/28/2022  Component Date Value Ref Range Status   WBC 10/28/2022 5.9  4.0 - 10.5 K/uL Final   RBC 10/28/2022 4.74  4.22 - 5.81 MIL/uL Final   Hemoglobin 10/28/2022 14.2  13.0 - 17.0 g/dL Final   HCT 16/06/9603 44.3  39.0 - 52.0 % Final   MCV 10/28/2022 93.5  80.0 - 100.0 fL Final   MCH 10/28/2022 30.0  26.0 - 34.0 pg Final   MCHC 10/28/2022 32.1  30.0 - 36.0 g/dL Final   RDW 54/05/8118 13.9   11.5 - 15.5 % Final   Platelets 10/28/2022 244  150 - 400 K/uL Final   nRBC 10/28/2022 0.0  0.0 - 0.2 % Final   Neutrophils Relative % 10/28/2022 72  % Final   Neutro Abs 10/28/2022 4.3  1.7 - 7.7 K/uL Final   Lymphocytes Relative 10/28/2022 19  % Final   Lymphs Abs 10/28/2022 1.1  0.7 - 4.0 K/uL Final   Monocytes Relative 10/28/2022 8  % Final   Monocytes Absolute 10/28/2022 0.5  0.1 - 1.0 K/uL Final   Eosinophils Relative 10/28/2022 1  % Final   Eosinophils Absolute 10/28/2022 0.0  0.0 - 0.5 K/uL Final   Basophils Relative 10/28/2022 0  % Final   Basophils Absolute 10/28/2022 0.0  0.0 - 0.1 K/uL Final   Immature Granulocytes 10/28/2022 0  % Final   Abs Immature Granulocytes 10/28/2022 0.01  0.00 - 0.07 K/uL Final   Performed at [facility], [address], [place], [place] [address]   Lipase 10/28/2022 137 (H)  11 - 51 U/L Final   Performed at [facility], [address] [place], [place] [address]   Opiates 10/28/2022 NONE DETECTED  NONE DETECTED Final   Cocaine 10/28/2022 POSITIVE (A)  NONE DETECTED Final   Benzodiazepines 10/28/2022 NONE DETECTED  NONE DETECTED Final   Amphetamines 10/28/2022 NONE DETECTED  NONE DETECTED Final   Tetrahydrocannabinol 10/28/2022 NONE DETECTED  NONE DETECTED Final   Barbiturates 10/28/2022 NONE DETECTED  NONE DETECTED Final   Comment: (NOTE) DRUG SCREEN FOR MEDICAL PURPOSES ONLY.  IF CONFIRMATION IS NEEDED FOR ANY PURPOSE, NOTIFY LAB WITHIN 5 DAYS.  LOWEST DETECTABLE LIMITS FOR URINE DRUG SCREEN Drug Class  Cutoff (ng/mL) Amphetamine and metabolites    1000 Barbiturate and metabolites    200 Benzodiazepine                 200 Opiates and metabolites        300 Cocaine and metabolites        300 THC                            50 Performed at [facility], [address], [place], [place] [address]    Alcohol, Ethyl (B) 10/28/2022 <10  <10 mg/dL Final   Comment: (NOTE) Lowest detectable limit for  serum alcohol is 10 mg/dL.  For medical purposes only. Performed at [facility], [address], [place], [place] [address]    Sodium 10/28/2022 135  135 - 145 mmol/L Final   Potassium 10/28/2022 4.0  3.5 - 5.1 mmol/L Final   Chloride 10/28/2022 99  98 - 111 mmol/L Final   CO2 10/28/2022 23  22 - 32 mmol/L Final   Glucose, Bld 10/28/2022 273 (H)  70 - 99 mg/dL Final   Glucose reference range applies only to samples taken after fasting for at least 8 hours.   BUN 10/28/2022 7  6 - 20 mg/dL Final   Creatinine, Ser 10/28/2022 0.69  0.61 - 1.24 mg/dL Final   Calcium 09/81/1914 9.0  8.9 - 10.3 mg/dL Final   Total Protein 78/29/5621 7.2  6.5 - 8.1 g/dL Final   Albumin 30/86/5784 3.8  3.5 - 5.0 g/dL Final   AST 69/62/9528 22  15 - 41 U/L Final   ALT 10/28/2022 17  0 - 44 U/L Final   Alkaline Phosphatase 10/28/2022 65  38 - 126 U/L Final   Total Bilirubin 10/28/2022 0.3  0.3 - 1.2 mg/dL Final   GFR, Estimated 10/28/2022 >60  >60 mL/min Final   Comment: (NOTE) Calculated using the CKD-EPI Creatinine Equation (2021)    Anion gap 10/28/2022 13  5 - 15 Final   Performed at [facility], [address] [place], [place] [address]   Salicylate Lvl 10/28/2022 <7.0 (L)  7.0 - 30.0 mg/dL Final   Performed at [facility], [address], [place], [place] [address]   Acetaminophen (Tylenol), Serum 10/28/2022 <10 (L)  10 - 30 ug/mL Final   Comment: (NOTE) Therapeutic concentrations vary significantly. A range of 10-30 ug/mL  may be an effective concentration for many patients. However, some  are best treated at concentrations outside of this range. Acetaminophen concentrations >150 ug/mL at 4 hours after ingestion  and >50 ug/mL at 12 hours after ingestion are often associated with  toxic reactions.  Performed at [facility], [address], [place], [place] [address]   Admission on 10/10/2022, Discharged on 10/10/2022  Component Date Value Ref Range Status    Glucose-Capillary 10/10/2022 >600 (HH)  70 - 99 mg/dL Final   Glucose reference range applies only to samples taken after fasting for at least 8 hours.   Sodium 10/10/2022 129 (L)  135 - 145 mmol/L Final   Potassium 10/10/2022 4.4  3.5 - 5.1 mmol/L Final   Chloride 10/10/2022 95 (L)  98 - 111 mmol/L Final   CO2 10/10/2022 24  22 - 32 mmol/L Final   Glucose, Bld 10/10/2022 683 (HH)  70 - 99 mg/dL Final   Comment: CRITICAL RESULT CALLED TO, READ BACK BY AND VERIFIED WITH  LAMB,S. RN AT 1219 10/10/22 MULLINS,T Glucose reference range applies only to samples taken after fasting for at least 8 hours.    BUN 10/10/2022 16  6 - 20 mg/dL Final   Creatinine, Ser 10/10/2022 0.93  0.61 - 1.24 mg/dL Final   Calcium 16/06/9603 8.4 (L)  8.9 - 10.3 mg/dL Final   Total Protein 54/05/8118 6.9  6.5 - 8.1 g/dL Final   Albumin 14/78/2956 3.6  3.5 - 5.0 g/dL Final   AST 21/30/8657 16  15 - 41 U/L Final   ALT 10/10/2022 11  0 - 44 U/L Final   Alkaline Phosphatase 10/10/2022 61  38 - 126 U/L Final   Total Bilirubin 10/10/2022 0.7  0.3 - 1.2 mg/dL Final   GFR, Estimated 10/10/2022 >60  >60 mL/min Final   Comment: (NOTE) Calculated using the CKD-EPI Creatinine Equation (2021)    Anion gap 10/10/2022 10  5 - 15 Final   Performed at [facility], [address] [place], [place] [address]   Color, Urine 10/10/2022 STRAW (A)  YELLOW Final   APPearance 10/10/2022 CLEAR  CLEAR Final   Specific Gravity, Urine 10/10/2022 1.031 (H)  1.005 - 1.030 Final   pH 10/10/2022 5.0  5.0 - 8.0 Final   Glucose, UA 10/10/2022 >=500 (A)  NEGATIVE mg/dL Final   Hgb urine dipstick 10/10/2022 NEGATIVE  NEGATIVE Final   Bilirubin Urine 10/10/2022 NEGATIVE  NEGATIVE Final   Ketones, ur 10/10/2022 NEGATIVE  NEGATIVE mg/dL Final   Protein, ur 29/52/8413 NEGATIVE  NEGATIVE mg/dL Final   Nitrite 24/40/1027 NEGATIVE  NEGATIVE Final   Leukocytes,Ua 10/10/2022 NEGATIVE  NEGATIVE Final   RBC / HPF 10/10/2022 0-5  0 - 5  RBC/hpf Final   WBC, UA 10/10/2022 0-5  0 - 5 WBC/hpf Final   Bacteria, UA 10/10/2022 NONE SEEN  NONE SEEN Final   Squamous Epithelial / HPF 10/10/2022 0-5  0 - 5 /HPF Final   Performed at [facility], [address] [place], [place] [address]   Beta-Hydroxybutyric Acid 10/10/2022 0.41 (H)  0.05 - 0.27 mmol/L Final   Performed at [facility], [address] [place], [place] [address]   pH, Ven 10/10/2022 7.39  7.25 - 7.43 Final   pCO2, Ven 10/10/2022 42 (L)  44 - 60 mmHg Final   pO2, Ven 10/10/2022 59 (H)  32 - 45 mmHg Final   Bicarbonate 10/10/2022 25.4  20.0 - 28.0 mmol/L Final   Acid-Base Excess 10/10/2022 0.3  0.0 - 2.0 mmol/L Final   O2 Saturation 10/10/2022 92.1  % Final   Patient temperature 10/10/2022 37.0   Final   Performed at [facility], [address], [place], [place] [address]   Osmolality 10/10/2022 315 (H)  275 - 295 mOsm/kg Final   Comment: REPEATED TO VERIFY Performed at [facility], [address] [place], [place] [address]    WBC 10/10/2022 5.3  4.0 - 10.5 K/uL Final   RBC 10/10/2022 4.89  4.22 - 5.81 MIL/uL Final   Hemoglobin 10/10/2022 14.7  13.0 - 17.0 g/dL Final   HCT 56/43/3295 44.6  39.0 - 52.0 % Final   MCV 10/10/2022 91.2  80.0 - 100.0 fL Final   MCH 10/10/2022 30.1  26.0 - 34.0 pg Final   MCHC 10/10/2022 33.0  30.0 - 36.0 g/dL Final   RDW 18/84/1660 13.1  11.5 - 15.5 % Final   Platelets 10/10/2022 235  150 - 400 K/uL Final  nRBC 10/10/2022 0.0  0.0 - 0.2 % Final   Performed at [facility], [address], [place], [place] [address]   Glucose-Capillary 10/10/2022 480 (H)  70 - 99 mg/dL Final   Glucose reference range applies only to samples taken after fasting for at least 8 hours.    Blood Alcohol level:  Lab Results  Component Value Date   ETH <10 03/02/2023   ETH <10 10/28/2022    Metabolic Disorder Labs: Lab Results  Component Value Date   HGBA1C  10.9 (H) 03/02/2023   MPG 266.13 03/02/2023   MPG 231.69 03/22/2021   No results found for: "PROLACTIN" Lab Results  Component Value Date   CHOL 112 11/30/2022   TRIG 82 11/30/2022   HDL 52 11/30/2022   CHOLHDL 2.2 11/30/2022   VLDL 21 04/10/2017   LDLCALC 44 11/30/2022   LDLCALC 99 03/24/2021    Therapeutic Lab Levels: No results found for: "LITHIUM" No results found for: "VALPROATE" No results found for: "CBMZ"  Physical Findings   GAD-7    Flowsheet Row Office Visit from 11/30/2022 in [facility]  Total GAD-7 Score 12      PHQ2-9    Flowsheet Row ED from 03/03/2023 in [facility] Office Visit from 11/30/2022 in [facility] Office Visit from 03/24/2021 in [facility] Office Visit from 08/23/2018 in [facility] Office Visit from 05/08/2017 in [facility]  PHQ-2 Total Score 2 0 0 0 0  PHQ-9 Total Score 3 3 -- -- --      Flowsheet Row ED from 03/03/2023 in [facility] ED from 03/01/2023 in [facility] ED from 02/05/2023 in [facility]  C-SSRS RISK CATEGORY No Risk High Risk No Risk        Musculoskeletal  Strength & Muscle Tone: within normal limits Gait & Station: normal Patient leans: N/A  Psychiatric Specialty Exam  Presentation  General Appearance:  Appropriate for Environment  Eye Contact: Good  Speech: Clear and Coherent  Speech Volume: Normal  Handedness: Right   Mood and Affect  Mood: Euthymic  Affect: Appropriate   Thought Process  Thought Processes: Coherent  Descriptions of Associations:Intact  Orientation:Full (Time, Place and Person)  Thought Content:Logical  Diagnosis of Schizophrenia or Schizoaffective disorder in past: No    Hallucinations:Hallucinations: None  Ideas  of Reference:None  Suicidal Thoughts:Suicidal Thoughts: No  Homicidal Thoughts:Homicidal Thoughts: No   Sensorium  Memory: Immediate Good; Recent Good; Remote Good  Judgment: Good  Insight: Good   Executive Functions  Concentration: Good  Attention Span: Good  Recall: Good  Fund of Knowledge: Good  Language: Good   Psychomotor Activity  Psychomotor Activity: Psychomotor Activity: Normal   Assets  Assets: Communication Skills; Desire for Improvement; Housing; Resilience   Sleep  Sleep: Sleep: Good   No data recorded  Physical Exam  Physical Exam Constitutional:      Appearance: Normal appearance.  HENT:     Head: Normocephalic and atraumatic.  Pulmonary:     Effort: Pulmonary effort is normal.  Neurological:     Mental Status: He is alert and oriented to person, place, and time.    Review of Systems  Psychiatric/Behavioral:  Negative for depression, hallucinations, memory loss and suicidal ideas. The patient does not have insomnia.    Blood pressure 121/86,  pulse 76, temperature 97.8 F (36.6 C), resp. rate 14, SpO2 100 %. There is no height or weight on file to calculate BMI.  Treatment Plan Summary: Daily contact with patient to assess and evaluate symptoms and progress in treatment and Medication management Patient doing well. His insight is very good as is his judgement. No medication adjustments appear to be needed.    #Cocaine Use Disorder #Substance-induced mood disorder #Prolonged Grief Disorder #Nicotine Use Disorder   Continue hydroxyzine 25 mg p.o. 3 times daily as needed Continue trazodone 100 mg p.o. nightly as needed Continue gabapentin 300 mg p.o. 3 times daily Nicotine patch 14 mg daily PRN  #T2DM -Continue glipizide 5 mg p.o. daily -Continue insulin coverage Semglee 30 units q PM -Moderate SSI Last CBG: 198  #Hyperlipidemia Continue Lipitor 10 mg daily Continue to monitor BP for improvement   Patient  encouraged to participate within the therapeutic milieu   Dispo: [place] 7/2   PGY-4 Bobbye Morton, MD 03/11/2023 10:48 AM

## 2023-03-11 NOTE — Group Note (Signed)
Group Topic: Communication  Group Date: 03/11/2023 Start Time: 0830 End Time: 0900 Facilitators: Rae Lips B  Department: [facility]  Number of Participants: 1  Group Focus: activities of daily living skills Treatment Modality:  Leisure Development Interventions utilized were leisure development Purpose: express feelings  Name: Lorrie Vance Date of Birth: [birth year]  MR: [id number]    Level of Participation: Did not attend groups.  Quality of Participation: NA Interactions with others: NA Mood/Affect: NA Triggers (if applicable): NA Cognition: NA Progress: Other Response: NA Plan: patient will be encouraged to to go to groups.   Patients Problems:  Patient Active Problem List   Diagnosis Date Noted   Prolonged grief disorder 03/05/2023   Polysubstance abuse (HCC) 03/03/2023   Substance induced mood disorder (HCC) 03/03/2023   Foot infection 11/30/2022   Hyperlipidemia 02/26/2019   Class 2 severe obesity due to excess calories with serious comorbidity and body mass index (BMI) of 35.0 to 35.9 in adult [facility]) 02/26/2019   Type 2 diabetes mellitus without complication, with long-term current use of insulin (HCC) 05/13/2017   Hypokalemia 11/06/2012

## 2023-03-11 NOTE — ED Notes (Signed)
Pt observed/assessed in room resting. RR even and unlabored, appearing in no noted distress. Environmental check complete, will continue to monitor for safety 

## 2023-03-11 NOTE — ED Notes (Signed)
Received patient this PM. Patient in day room watching television with peer. Patient respirations are even and unlabored. Will continue to monitor for safety.

## 2023-03-12 ENCOUNTER — Other Ambulatory Visit: Payer: Self-pay

## 2023-03-12 DIAGNOSIS — Z560 Unemployment, unspecified: Secondary | ICD-10-CM | POA: Diagnosis not present

## 2023-03-12 DIAGNOSIS — E119 Type 2 diabetes mellitus without complications: Secondary | ICD-10-CM | POA: Diagnosis not present

## 2023-03-12 DIAGNOSIS — Z5900 Homelessness unspecified: Secondary | ICD-10-CM | POA: Diagnosis not present

## 2023-03-12 DIAGNOSIS — F1410 Cocaine abuse, uncomplicated: Secondary | ICD-10-CM | POA: Diagnosis not present

## 2023-03-12 DIAGNOSIS — R45851 Suicidal ideations: Secondary | ICD-10-CM | POA: Diagnosis not present

## 2023-03-12 LAB — GLUCOSE, CAPILLARY
Glucose-Capillary: 83 mg/dL (ref 70–99)
Glucose-Capillary: 144 mg/dL — ABNORMAL HIGH (ref 70–99)
Glucose-Capillary: 172 mg/dL — ABNORMAL HIGH (ref 70–99)
Glucose-Capillary: 109 mg/dL — ABNORMAL HIGH (ref 70–99)
Glucose-Capillary: 211 mg/dL — ABNORMAL HIGH (ref 70–99)
Glucose-Capillary: 226 mg/dL — ABNORMAL HIGH (ref 70–99)

## 2023-03-12 MED ORDER — NICOTINE 14 MG/24HR TD PT24: 14.0000 mg | MEDICATED_PATCH | Freq: Every day | TRANSDERMAL | 0 refills | Status: AC | PRN

## 2023-03-12 MED ORDER — GLIPIZIDE 5 MG PO TABS: 5.0000 mg | ORAL_TABLET | Freq: Every day | ORAL | 0 refills | Status: AC

## 2023-03-12 MED ORDER — INSULIN ASPART 100 UNIT/ML IJ SOLN: 0.0000 [IU] | Freq: Three times a day (TID) | INTRAMUSCULAR | 0 refills | Status: DC

## 2023-03-12 MED ORDER — INSULIN ASPART 100 UNIT/ML IJ SOLN: 0.0000 [IU] | Freq: Every day | INTRAMUSCULAR | 0 refills | Status: DC

## 2023-03-12 MED ORDER — ATORVASTATIN CALCIUM 10 MG PO TABS: 10.0000 mg | ORAL_TABLET | Freq: Every day | ORAL | 0 refills | Status: AC

## 2023-03-12 MED ORDER — GABAPENTIN 300 MG PO CAPS: 300.0000 mg | ORAL_CAPSULE | Freq: Two times a day (BID) | ORAL | 0 refills | Status: AC

## 2023-03-12 MED ORDER — BASAGLAR KWIKPEN 100 UNIT/ML ~~LOC~~ SOPN: 30.0000 [IU] | PEN_INJECTOR | Freq: Every day | SUBCUTANEOUS | 0 refills | Status: DC

## 2023-03-12 MED ORDER — TRAZODONE HCL 100 MG PO TABS: 100.0000 mg | ORAL_TABLET | Freq: Every evening | ORAL | 0 refills | Status: AC | PRN

## 2023-03-12 NOTE — ED Notes (Signed)
Patient lying quietly in bed, no distress noted, will continue to monitor patient for safety. 

## 2023-03-12 NOTE — Group Note (Signed)
Group Topic: Overcoming Obstacles  Group Date: 03/12/2023 Start Time: 1615 End Time: 1700 Facilitators: Vonzell Schlatter B  Department: [facility]  Number of Participants: 3 Group Focus: daily focus and feeling awareness/expression Treatment Modality:  Psychoeducation Interventions utilized were problem solving Purpose: express feelings and improve communication skills  Name: Dakota Kim Date of Birth: December 04, 1970  MR: [id number]    Level of Participation: active Quality of Participation: attentive and cooperative Interactions with others: gave feedback Mood/Affect: positive Triggers (if applicable): n/a Cognition: coherent/clear Progress: Moderate Response: n/a Plan: follow-up needed  Patients Problems:  Patient Active Problem List   Diagnosis Date Noted   Prolonged grief disorder 03/05/2023   Polysubstance abuse (HCC) 03/03/2023   Substance induced mood disorder (HCC) 03/03/2023   Foot infection 11/30/2022   Hyperlipidemia 02/26/2019   Class 2 severe obesity due to excess calories with serious comorbidity and body mass index (BMI) of 35.0 to 35.9 in adult [facility]) 02/26/2019   Type 2 diabetes mellitus without complication, with long-term current use of insulin (HCC) 05/13/2017   Hypokalemia 11/06/2012

## 2023-03-12 NOTE — Group Note (Signed)
Group Topic: Change and Accountability  Group Date: 03/12/2023 Start Time: 0800 End Time: 0830 Facilitators: Rae Lips B  Department: [facility]  Number of Participants: None  Group Focus: acceptance Treatment Modality:  Leisure Development Interventions utilized were leisure development Purpose: express feelings  Name: Dakota Kim Date of Birth: 27-Oct-1970  MR: [id number]    Level of Participation: Pt did not attend groups.  Quality of Participation: Na Interactions with others: NA Mood/Affect: NA Triggers (if applicable): NA Cognition: NA Progress: Other Response: NA Plan: patient will be encouraged to attend groups.  Patients Problems:  Patient Active Problem List   Diagnosis Date Noted   Prolonged grief disorder 03/05/2023   Polysubstance abuse (HCC) 03/03/2023   Substance induced mood disorder (HCC) 03/03/2023   Foot infection 11/30/2022   Hyperlipidemia 02/26/2019   Class 2 severe obesity due to excess calories with serious comorbidity and body mass index (BMI) of 35.0 to 35.9 in adult [facility]) 02/26/2019   Type 2 diabetes mellitus without complication, with long-term current use of insulin (HCC) 05/13/2017   Hypokalemia 11/06/2012

## 2023-03-12 NOTE — ED Notes (Signed)
Patient denies SI,HI,AVH. Night medication administered without difficulty to patient. Patient is cooperative and interacts well with staff. Patient shows no signs of opioid withdrawal.  Respiratory is even and unlabored. No distress noted. Patient is awake and interacting with staff at nursing station at present. Patient stated no complaints at present. will continue to monitor for safety.

## 2023-03-12 NOTE — ED Notes (Signed)
Pt is currently sleeping, no distress noted, environmental check complete, will continue to monitor patient for safety.  

## 2023-03-12 NOTE — ED Provider Notes (Signed)
FBC/OBS ASAP Discharge Summary  Date and Time: 03/13/2023 7:15 AM  Name: Dakota Kim  MRN:  [id number]   Discharge Diagnoses:  Final diagnoses:  Substance induced mood disorder (HCC)  Polysubstance abuse (HCC)  Prolonged grief disorder   Subjective: Dakota Kim is a 52 year old male with no formal psychiatric diagnoses prior to admission but who meets criteria for cocaine use disorder, nicotine use disorder, and prolonged grief who presented voluntarily to [facility] for detox and substance use treatment, for which he was admitted to [facility].   Stay Summary: Patient initially presented to the [facility] with suicidal ideation, daily crack cocaine use, and chronic substance use with relapse 3 years ago in the context of his father's death. He is homeless and resides in his car. His labs on admission were notable for A1c 10.9 and UDS+cocaine, THC. He was motivated for substance use rehab and accepted to [facility] for ongoing management. At the [facility], he was started on his diabetes medications and gabapentin. He was motivated for residential substance use treatment. He reported depressive symptoms secondary to prolonged grief from his dad's death but declined initiating psychotropic medications and reported desire to connect with a therapist with close follow-up post discharge. He denied SI/HI/AVH during his stay. His CBGs improved with SSI with meals, at bedtime, and increasing his basal insulin at nighttime. He was continued on lipitor for his HLD. He was advised to follow-up with a PCP for ongoing diabetes management.   Total Time spent with patient: 30 minutes  Past Psychiatric History: per patient report: suicide attempt  Past Medical History: T2DM, HLD  Family History: none reported  Family Psychiatric History: none reported  Social History: homeless, chronic cocaine use, not linked with outpatient psychiatry  Tobacco Cessation:  A prescription for an FDA-approved tobacco cessation medication provided at  discharge  Current Medications:  Current Facility-Administered Medications  Medication Dose Route Frequency Provider Last Rate Last Admin   acetaminophen (TYLENOL) tablet 650 mg  650 mg Oral Q6H PRN Rankin, Shuvon B, NP   650 mg at 03/06/23 2033   alum & mag hydroxide-simeth (MAALOX/MYLANTA) 200-200-20 MG/5ML suspension 30 mL  30 mL Oral Q4H PRN Rankin, Shuvon B, NP       atorvastatin (LIPITOR) tablet 10 mg  10 mg Oral Daily Rankin, Shuvon B, NP   10 mg at 03/12/23 0904   gabapentin (NEURONTIN) capsule 300 mg  300 mg Oral BID Rankin, Shuvon B, NP   300 mg at 03/12/23 2153   glipiZIDE (GLUCOTROL) tablet 5 mg  5 mg Oral QAC breakfast Rankin, Shuvon B, NP   5 mg at 03/12/23 0904   insulin aspart (novoLOG) injection 0-15 Units  0-15 Units Subcutaneous TID WC Rankin, Shuvon B, NP   3 Units at 03/12/23 1215   insulin aspart (novoLOG) injection 0-5 Units  0-5 Units Subcutaneous QHS Rankin, Shuvon B, NP   2 Units at 03/12/23 2153   insulin glargine-yfgn (SEMGLEE) injection 30 Units  30 Units Subcutaneous QHS Lamar Sprinkles, MD   30 Units at 03/12/23 2030   nicotine (NICODERM CQ - dosed in mg/24 hours) patch 14 mg  14 mg Transdermal Daily PRN Lamar Sprinkles, MD       polyethylene glycol (MIRALAX / GLYCOLAX) packet 17 g  17 g Oral Daily PRN Ajibola, Ene A, NP   17 g at 03/10/23 2154   traZODone (DESYREL) tablet 100-150 mg  100-150 mg Oral QHS PRN Lamar Sprinkles, MD   150 mg at 03/12/23 2153   Current Outpatient  Medications  Medication Sig Dispense Refill   atorvastatin (LIPITOR) 10 MG tablet Take 1 tablet (10 mg total) by mouth daily. 30 tablet 0   gabapentin (NEURONTIN) 300 MG capsule Take 1 capsule (300 mg total) by mouth 2 (two) times daily. 60 capsule 0   glipiZIDE (GLUCOTROL) 5 MG tablet Take 1 tablet (5 mg total) by mouth daily before breakfast. 30 tablet 0   insulin aspart (NOVOLOG) 100 UNIT/ML injection Inject 0-15 Units into the skin 3 (three) times daily with meals. 10 mL 0   insulin aspart  (NOVOLOG) 100 UNIT/ML injection Inject 0-5 Units into the skin at bedtime. 1.5 mL 0   Insulin Glargine (BASAGLAR KWIKPEN) 100 UNIT/ML Inject 30 Units into the skin at bedtime. 9 mL 0   nicotine (NICODERM CQ - DOSED IN MG/24 HOURS) 14 mg/24hr patch Place 1 patch (14 mg total) onto the skin daily as needed (NRT). 28 patch 0   traZODone (DESYREL) 100 MG tablet Take 1 tablet (100 mg total) by mouth at bedtime as needed for sleep. 30 tablet 0    PTA Medications:  Facility Ordered Medications  Medication   acetaminophen (TYLENOL) tablet 650 mg   alum & mag hydroxide-simeth (MAALOX/MYLANTA) 200-200-20 MG/5ML suspension 30 mL   atorvastatin (LIPITOR) tablet 10 mg   gabapentin (NEURONTIN) capsule 300 mg   glipiZIDE (GLUCOTROL) tablet 5 mg   insulin aspart (novoLOG) injection 0-15 Units   insulin aspart (novoLOG) injection 0-5 Units   polyethylene glycol (MIRALAX / GLYCOLAX) packet 17 g   [COMPLETED] melatonin tablet 3 mg   nicotine (NICODERM CQ - dosed in mg/24 hours) patch 14 mg   [COMPLETED] traZODone (DESYREL) tablet 50 mg   [COMPLETED] insulin glargine-yfgn (SEMGLEE) injection 10 Units   traZODone (DESYREL) tablet 100-150 mg   insulin glargine-yfgn (SEMGLEE) injection 30 Units   PTA Medications  Medication Sig   nicotine (NICODERM CQ - DOSED IN MG/24 HOURS) 14 mg/24hr patch Place 1 patch (14 mg total) onto the skin daily as needed (NRT).   glipiZIDE (GLUCOTROL) 5 MG tablet Take 1 tablet (5 mg total) by mouth daily before breakfast.   Insulin Glargine (BASAGLAR KWIKPEN) 100 UNIT/ML Inject 30 Units into the skin at bedtime.   traZODone (DESYREL) 100 MG tablet Take 1 tablet (100 mg total) by mouth at bedtime as needed for sleep.   insulin aspart (NOVOLOG) 100 UNIT/ML injection Inject 0-15 Units into the skin 3 (three) times daily with meals.   insulin aspart (NOVOLOG) 100 UNIT/ML injection Inject 0-5 Units into the skin at bedtime.   gabapentin (NEURONTIN) 300 MG capsule Take 1 capsule (300  mg total) by mouth 2 (two) times daily.   atorvastatin (LIPITOR) 10 MG tablet Take 1 tablet (10 mg total) by mouth daily.       03/12/2023    4:30 PM 03/06/2023   11:49 AM 03/03/2023    2:34 PM  Depression screen PHQ 2/9  Decreased Interest 0 1 1  Down, Depressed, Hopeless 0 1 2  PHQ - 2 Score 0 2 3  Altered sleeping 0 0 1  Tired, decreased energy 0 0 1  Change in appetite 0 0 0  Feeling bad or failure about yourself  0 1 1  Trouble concentrating 0 0 0  Moving slowly or fidgety/restless 0 0 1  Suicidal thoughts 0 0 0  PHQ-9 Score 0 3 7  Difficult doing work/chores   Somewhat difficult    Flowsheet Row ED from 03/03/2023 in [facility]  ED from 03/01/2023 in [facility] from 02/05/2023 in [facility] Department [facility]  C-SSRS RISK CATEGORY No Risk High Risk No Risk       Musculoskeletal  Strength & Muscle Tone: within normal limits Gait & Station: normal Patient leans: N/A  Psychiatric Specialty Exam  Presentation  General Appearance:  Appropriate for Environment  Eye Contact: Good  Speech: Clear and Coherent  Speech Volume: Normal  Handedness: Right   Mood and Affect  Mood: Euthymic  Affect: Appropriate   Thought Process  Thought Processes: Coherent  Descriptions of Associations:Intact  Orientation:Full (Time, Place and Person)  Thought Content:Logical  Diagnosis of Schizophrenia or Schizoaffective disorder in past: No    Hallucinations:Hallucinations: None  Ideas of Reference:None  Suicidal Thoughts:Suicidal Thoughts: No  Homicidal Thoughts:Homicidal Thoughts: No   Sensorium  Memory: Recent Good; Immediate Good  Judgment: Good  Insight: Good   Executive Functions  Concentration: Good  Attention Span: Good  Recall: Good  Fund of Knowledge: Good  Language: Good   Psychomotor Activity  Psychomotor Activity: Psychomotor Activity:  Normal   Assets  Assets: Communication Skills; Desire for Improvement; Resilience   Sleep  Sleep: Sleep: Good  Physical Exam  Constitutional:      Appearance: the patient is not toxic-appearing.  Pulmonary:     Effort: Pulmonary effort is normal.  Neurological:     General: No focal deficit present.     Mental Status: the patient is alert and oriented to person, place, and time.   Review of Systems  Respiratory:  Negative for shortness of breath.   Cardiovascular:  Negative for chest pain.  Gastrointestinal:  Negative for abdominal pain, constipation, diarrhea, nausea and vomiting.  Neurological:  Negative for headaches.   Blood pressure 113/75, pulse 74, temperature 97.9 F (36.6 C), temperature source Tympanic, resp. rate 20, SpO2 100 %. There is no height or weight on file to calculate BMI.  Demographic Factors:  Male, Low socioeconomic status, and Unemployed  Loss Factors: Loss of significant relationship  Historical Factors: Impulsivity  Risk Reduction Factors:   Positive social support  Continued Clinical Symptoms:  Alcohol/Substance Abuse/Dependencies  Cognitive Features That Contribute To Risk:  Thought constriction (tunnel vision)    Suicide Risk:  Mild: There are no identifiable plans, no associated intent, mild dysphoria and related symptoms, good self-control (both objective and subjective assessment), few other risk factors, and identifiable protective factors, including available and accessible social support.  Plan Of Care/Follow-up recommendations:  Follow-up recommendations:  Activity:  Normal, as tolerated Diet:  Per PCP recommendation  Patient is instructed prior to discharge to: Take all medications as prescribed by her mental healthcare provider. Report any adverse effects and/or reactions from the medicines to her outpatient provider promptly. Patient has been instructed & cautioned: To not engage in alcohol and or illegal drug use while  on prescription medicines.  In the event of worsening symptoms, patient is instructed to call the crisis hotline at 988, 911 and or go to the nearest ED for appropriate evaluation and treatment of symptoms. To follow-up with her primary care provider for your other medical issues, concerns and or health care needs.   Disposition: Barbarann Ehlers, MD, PGY-2 03/13/2023, 7:15 AM

## 2023-03-12 NOTE — ED Notes (Signed)
Patients CBG is 226

## 2023-03-12 NOTE — ED Notes (Signed)
Patients blood sugar is 211

## 2023-03-12 NOTE — ED Provider Notes (Signed)
Behavioral Health Progress Note  Date and Time: 03/12/2023 12:27 PM Name: Dakota Kim MRN:  [id number]  Subjective:  Dakota Kim is a 52 year old male with no formal psychiatric diagnoses prior to admission but who meets criteria for cocaine use disorder, nicotine use disorder, and prolonged grief who presented voluntarily to [facility] for detox and substance use treatment, for which he was admitted to [facility].   Patient reports that he slept well, he denies issues with appetite. No changes in his mood. No thoughts of using cocaine. He is ready to go to 30 day rehab in [place]. He reports he plans to stay up in [place] because if he comes back he is more likely to use again. He reports he will ask the social worker in [place] what resources are available in that area including [employer]. He reports that he wants his own place and he is wanting to "stay on my P's and Q's." He reports insight to his use in response to close family passing away. He reports desire to stay focused and get tools in his toolbox to use. He denies SI/HI/AVH.   Diagnosis:  Final diagnoses:  Substance induced mood disorder (HCC)  Polysubstance abuse (HCC)  Prolonged grief disorder   Total Time spent with patient: 30 minutes  Past Psychiatric History: suicide attempt per patient report Past Medical History: T2DM, HLD Family History: none reported  Family Psychiatric  History: none reported  Social History:  homeless, chronic cocaine use, not linked with outpatient psychiatry   Additional Social History:                         Sleep: Good  Appetite:  Good  Current Medications:  Current Facility-Administered Medications  Medication Dose Route Frequency Provider Last Rate Last Admin   acetaminophen (TYLENOL) tablet 650 mg  650 mg Oral Q6H PRN Rankin, Shuvon B, NP   650 mg at 03/06/23 2033   alum & mag hydroxide-simeth (MAALOX/MYLANTA) 200-200-20 MG/5ML suspension 30 mL  30 mL Oral Q4H PRN Rankin, Shuvon B, NP        atorvastatin (LIPITOR) tablet 10 mg  10 mg Oral Daily Rankin, Shuvon B, NP   10 mg at 03/12/23 0904   gabapentin (NEURONTIN) capsule 300 mg  300 mg Oral BID Rankin, Shuvon B, NP   300 mg at 03/12/23 0904   glipiZIDE (GLUCOTROL) tablet 5 mg  5 mg Oral QAC breakfast Rankin, Shuvon B, NP   5 mg at 03/12/23 0904   insulin aspart (novoLOG) injection 0-15 Units  0-15 Units Subcutaneous TID WC Rankin, Shuvon B, NP   3 Units at 03/12/23 1215   insulin aspart (novoLOG) injection 0-5 Units  0-5 Units Subcutaneous QHS Rankin, Shuvon B, NP   2 Units at 03/11/23 2114   insulin glargine-yfgn (SEMGLEE) injection 30 Units  30 Units Subcutaneous QHS Lamar Sprinkles, MD   30 Units at 03/11/23 2103   nicotine (NICODERM CQ - dosed in mg/24 hours) patch 14 mg  14 mg Transdermal Daily PRN Lamar Sprinkles, MD       polyethylene glycol (MIRALAX / GLYCOLAX) packet 17 g  17 g Oral Daily PRN Ajibola, Ene A, NP   17 g at 03/10/23 2154   traZODone (DESYREL) tablet 100-150 mg  100-150 mg Oral QHS PRN Lamar Sprinkles, MD   100 mg at 03/11/23 2111   Current Outpatient Medications  Medication Sig Dispense Refill   atorvastatin (LIPITOR) 10 MG tablet TAKE 1 TABLET (10 MG  TOTAL) BY MOUTH DAILY. (Patient not taking: Reported on 03/02/2023) 30 tablet 11   gabapentin (NEURONTIN) 300 MG capsule Take 1 capsule (300 mg total) by mouth 2 (two) times daily. (Patient not taking: Reported on 03/02/2023) 60 capsule 2   glipiZIDE (GLUCOTROL) 5 MG tablet Take 1 tablet (5 mg total) by mouth daily before breakfast. (Patient not taking: Reported on 03/02/2023) 60 tablet 0   hydrOXYzine (ATARAX) 25 MG tablet Take 1 tablet (25 mg total) by mouth 3 (three) times daily as needed for anxiety. 30 tablet 0   insulin aspart (NOVOLOG) 100 UNIT/ML injection Inject 10 Units into the skin 3 (three) times daily with meals. 10 mL 11   insulin aspart (NOVOLOG) 100 UNIT/ML injection Inject 0-15 Units into the skin 3 (three) times daily with meals. 10 mL 11    insulin aspart (NOVOLOG) 100 UNIT/ML injection Inject 0-5 Units into the skin at bedtime. 10 mL 11   Insulin Glargine (BASAGLAR KWIKPEN) 100 UNIT/ML Inject 15 Units into the skin at bedtime. (Patient taking differently: Inject 20 Units into the skin at bedtime.) 15 mL 1   insulin glargine-yfgn (SEMGLEE) 100 UNIT/ML injection Inject 0.2 mLs (20 Units total) into the skin daily. 10 mL 11   traZODone (DESYREL) 50 MG tablet Take 1 tablet (50 mg total) by mouth at bedtime as needed for sleep.      Labs  Lab Results:  Admission on 03/03/2023  Component Date Value Ref Range Status   Glucose-Capillary 03/03/2023 287 (H)  70 - 99 mg/dL Final   Glucose reference range applies only to samples taken after fasting for at least 8 hours.   Glucose-Capillary 03/04/2023 252 (H)  70 - 99 mg/dL Final   Glucose reference range applies only to samples taken after fasting for at least 8 hours.   Glucose-Capillary 03/04/2023 341 (H)  70 - 99 mg/dL Final   Glucose reference range applies only to samples taken after fasting for at least 8 hours.   Glucose-Capillary 03/04/2023 286 (H)  70 - 99 mg/dL Final   Glucose reference range applies only to samples taken after fasting for at least 8 hours.   Glucose-Capillary 03/03/2023 175 (H)  70 - 99 mg/dL Final   Glucose reference range applies only to samples taken after fasting for at least 8 hours.   Glucose-Capillary 03/04/2023 182 (H)  70 - 99 mg/dL Final   Glucose reference range applies only to samples taken after fasting for at least 8 hours.   Glucose-Capillary 03/05/2023 254 (H)  70 - 99 mg/dL Final   Glucose reference range applies only to samples taken after fasting for at least 8 hours.   Glucose-Capillary 03/05/2023 163 (H)  70 - 99 mg/dL Final   Glucose reference range applies only to samples taken after fasting for at least 8 hours.   Glucose-Capillary 03/05/2023 242 (H)  70 - 99 mg/dL Final   Glucose reference range applies only to samples taken after  fasting for at least 8 hours.   Glucose-Capillary 03/05/2023 387 (H)  70 - 99 mg/dL Final   Glucose reference range applies only to samples taken after fasting for at least 8 hours.   Glucose-Capillary 03/06/2023 232 (H)  70 - 99 mg/dL Final   Glucose reference range applies only to samples taken after fasting for at least 8 hours.   Glucose-Capillary 03/06/2023 231 (H)  70 - 99 mg/dL Final   Glucose reference range applies only to samples taken after fasting for at least 8  hours.   Glucose-Capillary 03/06/2023 296 (H)  70 - 99 mg/dL Final   Glucose reference range applies only to samples taken after fasting for at least 8 hours.   Glucose-Capillary 03/06/2023 257 (H)  70 - 99 mg/dL Final   Glucose reference range applies only to samples taken after fasting for at least 8 hours.   Glucose-Capillary 03/07/2023 269 (H)  70 - 99 mg/dL Final   Glucose reference range applies only to samples taken after fasting for at least 8 hours.   Glucose-Capillary 03/06/2023 250 (H)  70 - 99 mg/dL Final   Glucose reference range applies only to samples taken after fasting for at least 8 hours.   Glucose-Capillary 03/07/2023 256 (H)  70 - 99 mg/dL Final   Glucose reference range applies only to samples taken after fasting for at least 8 hours.   Glucose-Capillary 03/07/2023 237 (H)  70 - 99 mg/dL Final   Glucose reference range applies only to samples taken after fasting for at least 8 hours.   Glucose-Capillary 03/07/2023 270 (H)  70 - 99 mg/dL Final   Glucose reference range applies only to samples taken after fasting for at least 8 hours.   Glucose-Capillary 03/08/2023 230 (H)  70 - 99 mg/dL Final   Glucose reference range applies only to samples taken after fasting for at least 8 hours.   Glucose-Capillary 03/08/2023 188 (H)  70 - 99 mg/dL Final   Glucose reference range applies only to samples taken after fasting for at least 8 hours.   Glucose-Capillary 03/08/2023 129 (H)  70 - 99 mg/dL Final   Glucose  reference range applies only to samples taken after fasting for at least 8 hours.   Glucose-Capillary 03/08/2023 316 (H)  70 - 99 mg/dL Final   Glucose reference range applies only to samples taken after fasting for at least 8 hours.   Glucose-Capillary 03/09/2023 207 (H)  70 - 99 mg/dL Final   Glucose reference range applies only to samples taken after fasting for at least 8 hours.   Glucose-Capillary 03/09/2023 155 (H)  70 - 99 mg/dL Final   Glucose reference range applies only to samples taken after fasting for at least 8 hours.   Glucose-Capillary 03/09/2023 203 (H)  70 - 99 mg/dL Final   Glucose reference range applies only to samples taken after fasting for at least 8 hours.   Glucose-Capillary 03/09/2023 270 (H)  70 - 99 mg/dL Final   Glucose reference range applies only to samples taken after fasting for at least 8 hours.   Comment 1 03/09/2023 RN  NOTIEFIED   Final   Glucose-Capillary 03/10/2023 198 (H)  70 - 99 mg/dL Final   Glucose reference range applies only to samples taken after fasting for at least 8 hours.   Glucose-Capillary 03/10/2023 200 (H)  70 - 99 mg/dL Final   Glucose reference range applies only to samples taken after fasting for at least 8 hours.   Glucose-Capillary 03/10/2023 194 (H)  70 - 99 mg/dL Final   Glucose reference range applies only to samples taken after fasting for at least 8 hours.   Glucose-Capillary 03/10/2023 205 (H)  70 - 99 mg/dL Final   Glucose reference range applies only to samples taken after fasting for at least 8 hours.   Comment 1 03/10/2023 RN AWARE   Final   Glucose-Capillary 03/11/2023 153 (H)  70 - 99 mg/dL Final   Glucose reference range applies only to samples taken after fasting for at  least 8 hours.   Glucose-Capillary 03/11/2023 188 (H)  70 - 99 mg/dL Final   Glucose reference range applies only to samples taken after fasting for at least 8 hours.   Glucose-Capillary 03/11/2023 171 (H)  70 - 99 mg/dL Final   Glucose reference  range applies only to samples taken after fasting for at least 8 hours.   Glucose-Capillary 03/11/2023 208 (H)  70 - 99 mg/dL Final   Glucose reference range applies only to samples taken after fasting for at least 8 hours.   Glucose-Capillary 03/12/2023 83  70 - 99 mg/dL Final   Glucose reference range applies only to samples taken after fasting for at least 8 hours.   Glucose-Capillary 03/12/2023 144 (H)  70 - 99 mg/dL Final   Glucose reference range applies only to samples taken after fasting for at least 8 hours.   Glucose-Capillary 03/12/2023 172 (H)  70 - 99 mg/dL Final   Glucose reference range applies only to samples taken after fasting for at least 8 hours.  Admission on 03/01/2023, Discharged on 03/03/2023  Component Date Value Ref Range Status   WBC 03/02/2023 5.7  4.0 - 10.5 K/uL Final   RBC 03/02/2023 4.71  4.22 - 5.81 MIL/uL Final   Hemoglobin 03/02/2023 13.7  13.0 - 17.0 g/dL Final   HCT 16/06/9603 43.7  39.0 - 52.0 % Final   MCV 03/02/2023 92.8  80.0 - 100.0 fL Final   MCH 03/02/2023 29.1  26.0 - 34.0 pg Final   MCHC 03/02/2023 31.4  30.0 - 36.0 g/dL Final   RDW 54/05/8118 14.1  11.5 - 15.5 % Final   Platelets 03/02/2023 256  150 - 400 K/uL Final   nRBC 03/02/2023 0.0  0.0 - 0.2 % Final   Neutrophils Relative % 03/02/2023 53  % Final   Neutro Abs 03/02/2023 3.1  1.7 - 7.7 K/uL Final   Lymphocytes Relative 03/02/2023 32  % Final   Lymphs Abs 03/02/2023 1.8  0.7 - 4.0 K/uL Final   Monocytes Relative 03/02/2023 11  % Final   Monocytes Absolute 03/02/2023 0.6  0.1 - 1.0 K/uL Final   Eosinophils Relative 03/02/2023 2  % Final   Eosinophils Absolute 03/02/2023 0.1  0.0 - 0.5 K/uL Final   Basophils Relative 03/02/2023 1  % Final   Basophils Absolute 03/02/2023 0.0  0.0 - 0.1 K/uL Final   Immature Granulocytes 03/02/2023 1  % Final   Abs Immature Granulocytes 03/02/2023 0.04  0.00 - 0.07 K/uL Final   Performed at [facility], [address], [place], [place] [address]    Sodium 03/02/2023 137  135 - 145 mmol/L Final   Potassium 03/02/2023 5.0  3.5 - 5.1 mmol/L Final   Chloride 03/02/2023 100  98 - 111 mmol/L Final   CO2 03/02/2023 27  22 - 32 mmol/L Final   Glucose, Bld 03/02/2023 424 (H)  70 - 99 mg/dL Final   Glucose reference range applies only to samples taken after fasting for at least 8 hours.   BUN 03/02/2023 14  6 - 20 mg/dL Final   Creatinine, Ser 03/02/2023 0.90  0.61 - 1.24 mg/dL Final   Calcium 95/62/1308 9.3  8.9 - 10.3 mg/dL Final   Total Protein 65/78/4696 6.5  6.5 - 8.1 g/dL Final   Albumin 29/52/8413 3.6  3.5 - 5.0 g/dL Final   AST 24/40/1027 13 (L)  15 - 41 U/L Final   ALT 03/02/2023 16  0 - 44 U/L Final  Alkaline Phosphatase 03/02/2023 65  38 - 126 U/L Final   Total Bilirubin 03/02/2023 0.5  0.3 - 1.2 mg/dL Final   GFR, Estimated 03/02/2023 >60  >60 mL/min Final   Comment: (NOTE) Calculated using the CKD-EPI Creatinine Equation (2021)    Anion gap 03/02/2023 10  5 - 15 Final   Performed at [facility], [address] [place], [place] [address]   Hgb A1c MFr Bld 03/02/2023 10.9 (H)  4.8 - 5.6 % Final   Comment: (NOTE) Pre diabetes:          5.7%-6.4%  Diabetes:              >6.4%  Glycemic control for   <7.0% adults with diabetes    Mean Plasma Glucose 03/02/2023 266.13  mg/dL Final   Performed at [facility], [address], [place], [place] [address]   Alcohol, Ethyl (B) 03/02/2023 <10  <10 mg/dL Final   Comment: (NOTE) Lowest detectable limit for serum alcohol is 10 mg/dL.  For medical purposes only. Performed at [facility], [address], [place], [place] [address]    TSH 03/02/2023 1.178  0.350 - 4.500 uIU/mL Final   Comment: Performed by a 3rd Generation assay with a functional sensitivity of <=0.01 uIU/mL. Performed at [facility], [address], [place], [place] [address]    POC Amphetamine UR 03/02/2023 None Detected  NONE DETECTED (Cut Off Level 1000 ng/mL) Final   POC  Secobarbital (BAR) 03/02/2023 None Detected  NONE DETECTED (Cut Off Level 300 ng/mL) Final   POC Buprenorphine (BUP) 03/02/2023 None Detected  NONE DETECTED (Cut Off Level 10 ng/mL) Final   POC Oxazepam (BZO) 03/02/2023 None Detected  NONE DETECTED (Cut Off Level 300 ng/mL) Final   POC Cocaine UR 03/02/2023 Positive (A)  NONE DETECTED (Cut Off Level 300 ng/mL) Final   POC Methamphetamine UR 03/02/2023 None Detected  NONE DETECTED (Cut Off Level 1000 ng/mL) Final   POC Morphine 03/02/2023 None Detected  NONE DETECTED (Cut Off Level 300 ng/mL) Final   POC Methadone UR 03/02/2023 None Detected  NONE DETECTED (Cut Off Level 300 ng/mL) Final   POC Oxycodone UR 03/02/2023 None Detected  NONE DETECTED (Cut Off Level 100 ng/mL) Final   POC Marijuana UR 03/02/2023 Positive (A)  NONE DETECTED (Cut Off Level 50 ng/mL) Final   Glucose-Capillary 03/02/2023 499 (H)  70 - 99 mg/dL Final   Glucose reference range applies only to samples taken after fasting for at least 8 hours.   Glucose-Capillary 03/02/2023 404 (H)  70 - 99 mg/dL Final   Glucose reference range applies only to samples taken after fasting for at least 8 hours.   Glucose-Capillary 03/02/2023 214 (H)  70 - 99 mg/dL Final   Glucose reference range applies only to samples taken after fasting for at least 8 hours.   Glucose-Capillary 03/02/2023 265 (H)  70 - 99 mg/dL Final   Glucose reference range applies only to samples taken after fasting for at least 8 hours.   Glucose-Capillary 03/03/2023 263 (H)  70 - 99 mg/dL Final   Glucose reference range applies only to samples taken after fasting for at least 8 hours.   Glucose-Capillary 03/03/2023 258 (H)  70 - 99 mg/dL Final   Glucose reference range applies only to samples taken after fasting for at least 8 hours.  Admission on 02/05/2023, Discharged on 02/05/2023  Component Date Value Ref Range Status   Glucose-Capillary 02/05/2023 380 (  H)  70 - 99 mg/dL Final   Glucose reference range applies only  to samples taken after fasting for at least 8 hours.   Sodium 02/05/2023 138  135 - 145 mmol/L Final   Potassium 02/05/2023 4.0  3.5 - 5.1 mmol/L Final   Chloride 02/05/2023 103  98 - 111 mmol/L Final   CO2 02/05/2023 26  22 - 32 mmol/L Final   Glucose, Bld 02/05/2023 380 (H)  70 - 99 mg/dL Final   Glucose reference range applies only to samples taken after fasting for at least 8 hours.   BUN 02/05/2023 11  6 - 20 mg/dL Final   Creatinine, Ser 02/05/2023 0.92  0.61 - 1.24 mg/dL Final   Calcium 16/06/9603 9.1  8.9 - 10.3 mg/dL Final   GFR, Estimated 02/05/2023 >60  >60 mL/min Final   Comment: (NOTE) Calculated using the CKD-EPI Creatinine Equation (2021)    Anion gap 02/05/2023 9  5 - 15 Final   Performed at [facility], [address], [place], [place] [address]   WBC 02/05/2023 7.2  4.0 - 10.5 K/uL Final   RBC 02/05/2023 5.40  4.22 - 5.81 MIL/uL Final   Hemoglobin 02/05/2023 16.0  13.0 - 17.0 g/dL Final   HCT 11/91/4782 49.7  39.0 - 52.0 % Final   MCV 02/05/2023 92.0  80.0 - 100.0 fL Final   MCH 02/05/2023 29.6  26.0 - 34.0 pg Final   MCHC 02/05/2023 32.2  30.0 - 36.0 g/dL Final   RDW 95/62/1308 13.9  11.5 - 15.5 % Final   Platelets 02/05/2023 266  150 - 400 K/uL Final   nRBC 02/05/2023 0.0  0.0 - 0.2 % Final   Performed at [facility], [address] [place], [place] [address]   Glucose-Capillary 02/05/2023 342 (H)  70 - 99 mg/dL Final   Glucose reference range applies only to samples taken after fasting for at least 8 hours.   Total Protein 02/05/2023 7.5  6.5 - 8.1 g/dL Final   Albumin 69/62/9528 4.1  3.5 - 5.0 g/dL Final   AST 41/32/4401 16  15 - 41 U/L Final   ALT 02/05/2023 12  0 - 44 U/L Final   Alkaline Phosphatase 02/05/2023 83  38 - 126 U/L Final   Total Bilirubin 02/05/2023 1.2  0.3 - 1.2 mg/dL Final   Bilirubin, Direct 02/05/2023 0.3 (H)  0.0 - 0.2 mg/dL Final   Indirect Bilirubin 02/05/2023 0.9  0.3 - 0.9 mg/dL Final   Performed at [facility], [address], [place], [place] [address]   Lipase 02/05/2023 36  11 - 51 U/L Final   Performed at [facility], [address] [place], [place] [address]   pH, Ven 02/05/2023 7.352  7.25 - 7.43 Final   pCO2, Ven 02/05/2023 48.6  44 - 60 mmHg Final   pO2, Ven 02/05/2023 28 (LL)  32 - 45 mmHg Final   Bicarbonate 02/05/2023 26.9  20.0 - 28.0 mmol/L Final   TCO2 02/05/2023 28  22 - 32 mmol/L Final   O2 Saturation 02/05/2023 49  % Final   Acid-Base Excess 02/05/2023 0.0  0.0 - 2.0 mmol/L Final   Sodium 02/05/2023 140  135 - 145 mmol/L Final   Potassium 02/05/2023 3.9  3.5 - 5.1 mmol/L Final   Calcium, Ion 02/05/2023 1.22  1.15 - 1.40 mmol/L Final   HCT 02/05/2023 50.0  39.0 - 52.0 % Final   Hemoglobin 02/05/2023 17.0  13.0 - 17.0 g/dL Final   Sample type 81/19/1478 VENOUS   Final   Comment 02/05/2023 NOTIFIED PHYSICIAN   Final   Beta-Hydroxybutyric Acid 02/05/2023 0.21  0.05 - 0.27 mmol/L Final   Performed at [facility], [address], [place], [place] [address]   Glucose-Capillary 02/05/2023 213 (H)  70 - 99 mg/dL Final   Glucose reference range applies only to samples taken after fasting for at least 8 hours.  Admission on 01/27/2023, Discharged on 01/27/2023  Component Date Value Ref Range Status   Glucose-Capillary 01/27/2023 454 (H)  70 - 99 mg/dL Final   Glucose reference range applies only to samples taken after fasting for at least 8 hours.   WBC 01/27/2023 5.3  4.0 - 10.5 K/uL Final   RBC 01/27/2023 4.77  4.22 - 5.81 MIL/uL Final   Hemoglobin 01/27/2023 14.2  13.0 - 17.0 g/dL Final   HCT 13/04/6577 43.5  39.0 - 52.0 % Final   MCV 01/27/2023 91.2  80.0 - 100.0 fL Final   MCH 01/27/2023 29.8  26.0 - 34.0 pg Final   MCHC 01/27/2023 32.6  30.0 - 36.0 g/dL Final   RDW 46/96/2952 13.9  11.5 - 15.5 % Final   Platelets 01/27/2023 238  150 - 400 K/uL Final   nRBC 01/27/2023 0.0  0.0 - 0.2 % Final   Neutrophils Relative % 01/27/2023 68  % Final   Neutro Abs 01/27/2023  3.5  1.7 - 7.7 K/uL Final   Lymphocytes Relative 01/27/2023 23  % Final   Lymphs Abs 01/27/2023 1.2  0.7 - 4.0 K/uL Final   Monocytes Relative 01/27/2023 9  % Final   Monocytes Absolute 01/27/2023 0.5  0.1 - 1.0 K/uL Final   Eosinophils Relative 01/27/2023 0  % Final   Eosinophils Absolute 01/27/2023 0.0  0.0 - 0.5 K/uL Final   Basophils Relative 01/27/2023 0  % Final   Basophils Absolute 01/27/2023 0.0  0.0 - 0.1 K/uL Final   Immature Granulocytes 01/27/2023 0  % Final   Abs Immature Granulocytes 01/27/2023 0.02  0.00 - 0.07 K/uL Final   Performed at [facility], [address] [place], [place] [address]   Sodium 01/27/2023 133 (L)  135 - 145 mmol/L Final   Potassium 01/27/2023 4.5  3.5 - 5.1 mmol/L Final   Chloride 01/27/2023 99  98 - 111 mmol/L Final   CO2 01/27/2023 25  22 - 32 mmol/L Final   Glucose, Bld 01/27/2023 410 (H)  70 - 99 mg/dL Final   Glucose reference range applies only to samples taken after fasting for at least 8 hours.   BUN 01/27/2023 17  6 - 20 mg/dL Final   Creatinine, Ser 01/27/2023 0.89  0.61 - 1.24 mg/dL Final   Calcium 44/09/270 8.5 (L)  8.9 - 10.3 mg/dL Final   Total Protein 53/66/4403 6.8  6.5 - 8.1 g/dL Final   Albumin 47/42/5956 3.6  3.5 - 5.0 g/dL Final   AST 38/75/6433 19  15 - 41 U/L Final   ALT 01/27/2023 15  0 - 44 U/L Final   Alkaline Phosphatase 01/27/2023 60  38 - 126 U/L Final   Total Bilirubin 01/27/2023 0.7  0.3 - 1.2 mg/dL Final   GFR, Estimated 01/27/2023 >60  >60 mL/min Final   Comment: (NOTE) Calculated using the CKD-EPI Creatinine Equation (2021)    Anion gap 01/27/2023 9  5 - 15 Final   Performed at [facility], [address]  [place], [place] [address]   Glucose-Capillary 01/27/2023 339 (H)  70 - 99 mg/dL Final   Glucose reference range applies only to samples taken after fasting for at least 8 hours.  Admission on 12/23/2022, Discharged on 12/23/2022  Component Date Value Ref Range Status    Glucose-Capillary 12/23/2022 339 (H)  70 - 99 mg/dL Final   Glucose reference range applies only to samples taken after fasting for at least 8 hours.   Comment 1 12/23/2022 Notify RN   Final   Sodium 12/23/2022 132 (L)  135 - 145 mmol/L Final   Potassium 12/23/2022 4.0  3.5 - 5.1 mmol/L Final   Chloride 12/23/2022 102  98 - 111 mmol/L Final   CO2 12/23/2022 26  22 - 32 mmol/L Final   Glucose, Bld 12/23/2022 351 (H)  70 - 99 mg/dL Final   Glucose reference range applies only to samples taken after fasting for at least 8 hours.   BUN 12/23/2022 13  6 - 20 mg/dL Final   Creatinine, Ser 12/23/2022 0.70  0.61 - 1.24 mg/dL Final   Calcium 60/45/4098 8.3 (L)  8.9 - 10.3 mg/dL Final   GFR, Estimated 12/23/2022 >60  >60 mL/min Final   Comment: (NOTE) Calculated using the CKD-EPI Creatinine Equation (2021)    Anion gap 12/23/2022 4 (L)  5 - 15 Final   Performed at [facility], [address], [place], [place] [address]   WBC 12/23/2022 6.9  4.0 - 10.5 K/uL Final   RBC 12/23/2022 4.60  4.22 - 5.81 MIL/uL Final   Hemoglobin 12/23/2022 13.6  13.0 - 17.0 g/dL Final   HCT 78/29/5621 42.7  39.0 - 52.0 % Final   MCV 12/23/2022 92.8  80.0 - 100.0 fL Final   MCH 12/23/2022 29.6  26.0 - 34.0 pg Final   MCHC 12/23/2022 31.9  30.0 - 36.0 g/dL Final   RDW 30/86/5784 13.4  11.5 - 15.5 % Final   Platelets 12/23/2022 268  150 - 400 K/uL Final   nRBC 12/23/2022 0.0  0.0 - 0.2 % Final   Performed at [facility], [address], [place], [place] [address]   Color, Urine 12/23/2022 YELLOW  YELLOW Final   APPearance 12/23/2022 CLEAR  CLEAR Final   Specific Gravity, Urine 12/23/2022 1.026  1.005 - 1.030 Final   pH 12/23/2022 7.0  5.0 - 8.0 Final   Glucose, UA 12/23/2022 >=500 (A)  NEGATIVE mg/dL Final   Hgb urine dipstick 12/23/2022 NEGATIVE  NEGATIVE Final   Bilirubin Urine 12/23/2022 NEGATIVE  NEGATIVE Final   Ketones, ur 12/23/2022 NEGATIVE  NEGATIVE mg/dL Final    Protein, ur 52/84/1324 NEGATIVE  NEGATIVE mg/dL Final   Nitrite 40/06/2724 NEGATIVE  NEGATIVE Final   Leukocytes,Ua 12/23/2022 NEGATIVE  NEGATIVE Final   RBC / HPF 12/23/2022 0-5  0 - 5 RBC/hpf Final   WBC, UA 12/23/2022 0-5  0 - 5 WBC/hpf Final   Bacteria, UA 12/23/2022 NONE SEEN  NONE SEEN Final   Squamous Epithelial / HPF 12/23/2022 0-5  0 - 5 /HPF Final   Performed at [facility], [address], [place], [place] [address]   Glucose-Capillary 12/23/2022 295 (H)  70 - 99 mg/dL Final   Glucose reference range applies only to samples taken after fasting for at least 8 hours.   Glucose-Capillary 12/23/2022 231 (H)  70 - 99 mg/dL Final   Glucose reference range applies only to samples taken after fasting for at least 8 hours.  Office Visit on 11/30/2022  Component Date Value Ref Range Status   Creatinine, Urine 11/30/2022 108.2  Not Estab. mg/dL Final   Microalbumin, Urine 11/30/2022 12.1  Not Estab. ug/mL Final   Microalb/Creat Ratio 11/30/2022 11  0 - 29 mg/g creat Final   Comment:                        Normal:                0 -  29                        Moderately increased: 30 - 300                        Severely increased:       >300    Glucose 11/30/2022 49 (L)  70 - 99 mg/dL Final   BUN 16/06/9603 11  6 - 24 mg/dL Final   Creatinine, Ser 11/30/2022 0.77  0.76 - 1.27 mg/dL Final   eGFR 54/05/8118 108  >59 mL/min/1.73 Final   BUN/Creatinine Ratio 11/30/2022 14  9 - 20 Final   Sodium 11/30/2022 144  134 - 144 mmol/L Final   Potassium 11/30/2022 5.2  3.5 - 5.2 mmol/L Final   Chloride 11/30/2022 104  96 - 106 mmol/L Final   CO2 11/30/2022 26  20 - 29 mmol/L Final   Calcium 11/30/2022 9.3  8.7 - 10.2 mg/dL Final   Total Protein 14/78/2956 7.0  6.0 - 8.5 g/dL Final   Albumin 21/30/8657 4.2  3.8 - 4.9 g/dL Final   Globulin, Total 11/30/2022 2.8  1.5 - 4.5 g/dL Final   Albumin/Globulin Ratio 11/30/2022 1.5  1.2 - 2.2 Final   Bilirubin Total 11/30/2022 0.2  0.0  - 1.2 mg/dL Final   Alkaline Phosphatase 11/30/2022 66  44 - 121 IU/L Final   AST 11/30/2022 16  0 - 40 IU/L Final   ALT 11/30/2022 13  0 - 44 IU/L Final   Hemoglobin A1C 11/30/2022 9.0 (A)  4.0 - 5.6 % Final   POC Glucose 11/30/2022 60 (A)  70 - 99 mg/dl Final   WBC 84/69/6295 7.7  3.4 - 10.8 x10E3/uL Final   RBC 11/30/2022 4.94  4.14 - 5.80 x10E6/uL Final   Hemoglobin 11/30/2022 14.6  13.0 - 17.7 g/dL Final   Hematocrit 28/41/3244 45.2  37.5 - 51.0 % Final   MCV 11/30/2022 92  79 - 97 fL Final   MCH 11/30/2022 29.6  26.6 - 33.0 pg Final   MCHC 11/30/2022 32.3  31.5 - 35.7 g/dL Final   RDW 09/13/7251 12.7  11.6 - 15.4 % Final   Platelets 11/30/2022 269  150 - 450 x10E3/uL Final   Neutrophils 11/30/2022 61  Not Estab. % Final   Lymphs 11/30/2022 28  Not Estab. % Final   Monocytes 11/30/2022 10  Not Estab. % Final   Eos 11/30/2022 1  Not Estab. % Final   Basos 11/30/2022 0  Not Estab. % Final   Neutrophils Absolute 11/30/2022 4.6  1.4 - 7.0 x10E3/uL Final   Lymphocytes Absolute 11/30/2022 2.2  0.7 - 3.1 x10E3/uL Final   Monocytes Absolute 11/30/2022 0.8  0.1 - 0.9 x10E3/uL Final   EOS (ABSOLUTE) 11/30/2022 0.1  0.0 - 0.4 x10E3/uL Final   Basophils Absolute 11/30/2022 0.0  0.0 - 0.2 x10E3/uL Final   Immature Granulocytes 11/30/2022 0  Not Estab. %  Final   Immature Grans (Abs) 11/30/2022 0.0  0.0 - 0.1 x10E3/uL Final   Cholesterol, Total 11/30/2022 112  100 - 199 mg/dL Final   Triglycerides 16/06/9603 82  0 - 149 mg/dL Final   HDL 54/05/8118 52  >39 mg/dL Final   VLDL Cholesterol Cal 11/30/2022 16  5 - 40 mg/dL Final   LDL Chol Calc (NIH) 11/30/2022 44  0 - 99 mg/dL Final   Chol/HDL Ratio 11/30/2022 2.2  0.0 - 5.0 ratio Final   Comment:                                   T. Chol/HDL Ratio                                             Men  Women                               1/2 Avg.Risk  3.4    3.3                                   Avg.Risk  5.0    4.4                                2X  Avg.Risk  9.6    7.1                                3X Avg.Risk 23.4   11.0   Admission on 10/28/2022, Discharged on 10/28/2022  Component Date Value Ref Range Status   WBC 10/28/2022 5.9  4.0 - 10.5 K/uL Final   RBC 10/28/2022 4.74  4.22 - 5.81 MIL/uL Final   Hemoglobin 10/28/2022 14.2  13.0 - 17.0 g/dL Final   HCT 14/78/2956 44.3  39.0 - 52.0 % Final   MCV 10/28/2022 93.5  80.0 - 100.0 fL Final   MCH 10/28/2022 30.0  26.0 - 34.0 pg Final   MCHC 10/28/2022 32.1  30.0 - 36.0 g/dL Final   RDW 21/30/8657 13.9  11.5 - 15.5 % Final   Platelets 10/28/2022 244  150 - 400 K/uL Final   nRBC 10/28/2022 0.0  0.0 - 0.2 % Final   Neutrophils Relative % 10/28/2022 72  % Final   Neutro Abs 10/28/2022 4.3  1.7 - 7.7 K/uL Final   Lymphocytes Relative 10/28/2022 19  % Final   Lymphs Abs 10/28/2022 1.1  0.7 - 4.0 K/uL Final   Monocytes Relative 10/28/2022 8  % Final   Monocytes Absolute 10/28/2022 0.5  0.1 - 1.0 K/uL Final   Eosinophils Relative 10/28/2022 1  % Final   Eosinophils Absolute 10/28/2022 0.0  0.0 - 0.5 K/uL Final   Basophils Relative 10/28/2022 0  % Final   Basophils Absolute 10/28/2022 0.0  0.0 - 0.1 K/uL Final   Immature Granulocytes 10/28/2022 0  % Final   Abs Immature Granulocytes 10/28/2022 0.01  0.00 - 0.07 K/uL Final   Performed at [facility], [address] [place], [place] [address]   Lipase 10/28/2022  137 (H)  11 - 51 U/L Final   Performed at [facility], [address], [place], [place] [address]   Opiates 10/28/2022 NONE DETECTED  NONE DETECTED Final   Cocaine 10/28/2022 POSITIVE (A)  NONE DETECTED Final   Benzodiazepines 10/28/2022 NONE DETECTED  NONE DETECTED Final   Amphetamines 10/28/2022 NONE DETECTED  NONE DETECTED Final   Tetrahydrocannabinol 10/28/2022 NONE DETECTED  NONE DETECTED Final   Barbiturates 10/28/2022 NONE DETECTED  NONE DETECTED Final   Comment: (NOTE) DRUG SCREEN FOR MEDICAL PURPOSES ONLY.  IF CONFIRMATION IS NEEDED FOR ANY PURPOSE,  NOTIFY LAB WITHIN 5 DAYS.  LOWEST DETECTABLE LIMITS FOR URINE DRUG SCREEN Drug Class                     Cutoff (ng/mL) Amphetamine and metabolites    1000 Barbiturate and metabolites    200 Benzodiazepine                 200 Opiates and metabolites        300 Cocaine and metabolites        300 THC                            50 Performed at [facility], [address], [place], [place] [address]    Alcohol, Ethyl (B) 10/28/2022 <10  <10 mg/dL Final   Comment: (NOTE) Lowest detectable limit for serum alcohol is 10 mg/dL.  For medical purposes only. Performed at [facility], [address], [place], [place] [address]    Sodium 10/28/2022 135  135 - 145 mmol/L Final   Potassium 10/28/2022 4.0  3.5 - 5.1 mmol/L Final   Chloride 10/28/2022 99  98 - 111 mmol/L Final   CO2 10/28/2022 23  22 - 32 mmol/L Final   Glucose, Bld 10/28/2022 273 (H)  70 - 99 mg/dL Final   Glucose reference range applies only to samples taken after fasting for at least 8 hours.   BUN 10/28/2022 7  6 - 20 mg/dL Final   Creatinine, Ser 10/28/2022 0.69  0.61 - 1.24 mg/dL Final   Calcium 91/47/8295 9.0  8.9 - 10.3 mg/dL Final   Total Protein 62/13/0865 7.2  6.5 - 8.1 g/dL Final   Albumin 78/46/9629 3.8  3.5 - 5.0 g/dL Final   AST 52/84/1324 22  15 - 41 U/L Final   ALT 10/28/2022 17  0 - 44 U/L Final   Alkaline Phosphatase 10/28/2022 65  38 - 126 U/L Final   Total Bilirubin 10/28/2022 0.3  0.3 - 1.2 mg/dL Final   GFR, Estimated 10/28/2022 >60  >60 mL/min Final   Comment: (NOTE) Calculated using the CKD-EPI Creatinine Equation (2021)    Anion gap 10/28/2022 13  5 - 15 Final   Performed at [facility], [address] [place], [place] [address]   Salicylate Lvl 10/28/2022 <7.0 (L)  7.0 - 30.0 mg/dL Final   Performed at [facility], [address], [place], [place] [address]   Acetaminophen (Tylenol), Serum 10/28/2022 <10 (L)  10 - 30 ug/mL Final   Comment: (NOTE) Therapeutic  concentrations vary significantly. A range of 10-30 ug/mL  may be an effective concentration for many patients. However, some  are best treated at concentrations outside of this range. Acetaminophen concentrations >150 ug/mL at 4 hours after ingestion  and >50 ug/mL at 12 hours after ingestion are  often associated with  toxic reactions.  Performed at [facility], [address], [place], [place] [address]   Admission on 10/10/2022, Discharged on 10/10/2022  Component Date Value Ref Range Status   Glucose-Capillary 10/10/2022 >600 (HH)  70 - 99 mg/dL Final   Glucose reference range applies only to samples taken after fasting for at least 8 hours.   Sodium 10/10/2022 129 (L)  135 - 145 mmol/L Final   Potassium 10/10/2022 4.4  3.5 - 5.1 mmol/L Final   Chloride 10/10/2022 95 (L)  98 - 111 mmol/L Final   CO2 10/10/2022 24  22 - 32 mmol/L Final   Glucose, Bld 10/10/2022 683 (HH)  70 - 99 mg/dL Final   Comment: CRITICAL RESULT CALLED TO, READ BACK BY AND VERIFIED WITH LAMB,S. RN AT 1219 10/10/22 MULLINS,T Glucose reference range applies only to samples taken after fasting for at least 8 hours.    BUN 10/10/2022 16  6 - 20 mg/dL Final   Creatinine, Ser 10/10/2022 0.93  0.61 - 1.24 mg/dL Final   Calcium 98/07/9146 8.4 (L)  8.9 - 10.3 mg/dL Final   Total Protein 82/95/6213 6.9  6.5 - 8.1 g/dL Final   Albumin 08/65/7846 3.6  3.5 - 5.0 g/dL Final   AST 96/29/5284 16  15 - 41 U/L Final   ALT 10/10/2022 11  0 - 44 U/L Final   Alkaline Phosphatase 10/10/2022 61  38 - 126 U/L Final   Total Bilirubin 10/10/2022 0.7  0.3 - 1.2 mg/dL Final   GFR, Estimated 10/10/2022 >60  >60 mL/min Final   Comment: (NOTE) Calculated using the CKD-EPI Creatinine Equation (2021)    Anion gap 10/10/2022 10  5 - 15 Final   Performed at [facility], [address], [place], [place] [address]   Color, Urine 10/10/2022 STRAW (A)  YELLOW Final   APPearance 10/10/2022 CLEAR  CLEAR Final    Specific Gravity, Urine 10/10/2022 1.031 (H)  1.005 - 1.030 Final   pH 10/10/2022 5.0  5.0 - 8.0 Final   Glucose, UA 10/10/2022 >=500 (A)  NEGATIVE mg/dL Final   Hgb urine dipstick 10/10/2022 NEGATIVE  NEGATIVE Final   Bilirubin Urine 10/10/2022 NEGATIVE  NEGATIVE Final   Ketones, ur 10/10/2022 NEGATIVE  NEGATIVE mg/dL Final   Protein, ur 09/13/7251 NEGATIVE  NEGATIVE mg/dL Final   Nitrite 66/44/0347 NEGATIVE  NEGATIVE Final   Leukocytes,Ua 10/10/2022 NEGATIVE  NEGATIVE Final   RBC / HPF 10/10/2022 0-5  0 - 5 RBC/hpf Final   WBC, UA 10/10/2022 0-5  0 - 5 WBC/hpf Final   Bacteria, UA 10/10/2022 NONE SEEN  NONE SEEN Final   Squamous Epithelial / HPF 10/10/2022 0-5  0 - 5 /HPF Final   Performed at [facility], [address], [place], [place] [address]   Beta-Hydroxybutyric Acid 10/10/2022 0.41 (H)  0.05 - 0.27 mmol/L Final   Performed at [facility], [address] [place], [place] [address]   pH, Ven 10/10/2022 7.39  7.25 - 7.43 Final   pCO2, Ven 10/10/2022 42 (L)  44 - 60 mmHg Final   pO2, Ven 10/10/2022 59 (H)  32 - 45 mmHg Final   Bicarbonate 10/10/2022 25.4  20.0 - 28.0 mmol/L Final   Acid-Base Excess 10/10/2022 0.3  0.0 - 2.0 mmol/L Final   O2 Saturation 10/10/2022 92.1  % Final   Patient temperature 10/10/2022 37.0   Final   Performed at [facility], [address] [place], [place] [address]   Osmolality 10/10/2022 315 (H)  275 - 295 mOsm/kg Final   Comment: REPEATED TO VERIFY Performed at [facility], [address] [place], [place] [address]    WBC 10/10/2022 5.3  4.0 - 10.5 K/uL Final   RBC 10/10/2022 4.89  4.22 - 5.81 MIL/uL Final   Hemoglobin 10/10/2022 14.7  13.0 - 17.0 g/dL Final   HCT 78/29/5621 44.6  39.0 - 52.0 % Final   MCV 10/10/2022 91.2  80.0 - 100.0 fL Final   MCH 10/10/2022 30.1  26.0 - 34.0 pg Final   MCHC 10/10/2022 33.0  30.0 - 36.0 g/dL Final   RDW 30/86/5784 13.1  11.5 - 15.5 % Final    Platelets 10/10/2022 235  150 - 400 K/uL Final   nRBC 10/10/2022 0.0  0.0 - 0.2 % Final   Performed at [facility], [address] [place], [place] [address]   Glucose-Capillary 10/10/2022 480 (H)  70 - 99 mg/dL Final   Glucose reference range applies only to samples taken after fasting for at least 8 hours.    Blood Alcohol level:  Lab Results  Component Value Date   ETH <10 03/02/2023   ETH <10 10/28/2022    Metabolic Disorder Labs: Lab Results  Component Value Date   HGBA1C 10.9 (H) 03/02/2023   MPG 266.13 03/02/2023   MPG 231.69 03/22/2021   No results found for: "PROLACTIN" Lab Results  Component Value Date   CHOL 112 11/30/2022   TRIG 82 11/30/2022   HDL 52 11/30/2022   CHOLHDL 2.2 11/30/2022   VLDL 21 04/10/2017   LDLCALC 44 11/30/2022   LDLCALC 99 03/24/2021    Therapeutic Lab Levels: No results found for: "LITHIUM" No results found for: "VALPROATE" No results found for: "CBMZ"  Physical Findings   GAD-7    Flowsheet Row Office Visit from 11/30/2022 in [facility]  Total GAD-7 Score 12      PHQ2-9    Flowsheet Row ED from 03/03/2023 in [facility] Office Visit from 11/30/2022 in [facility] Office Visit from 03/24/2021 in [facility] Office Visit from 08/23/2018 in [facility] Office Visit from 05/08/2017 in [facility]  PHQ-2 Total Score 2 0 0 0 0  PHQ-9 Total Score 3 3 -- -- --      Flowsheet Row ED from 03/03/2023 in [facility] ED from 03/01/2023 in [facility] ED from 02/05/2023 in [facility] Emergency Department at [facility]  C-SSRS RISK CATEGORY No Risk High Risk No Risk        Musculoskeletal  Strength & Muscle Tone: within normal limits Gait & Station: normal Patient leans: N/A  Psychiatric  Specialty Exam  Presentation  General Appearance:  Appropriate for Environment  Eye Contact: Good  Speech: Clear and Coherent  Speech Volume: Normal  Handedness: Right   Mood and Affect  Mood: Euthymic  Affect: Appropriate   Thought Process  Thought Processes: Coherent  Descriptions of Associations:Intact  Orientation:Full (Time, Place and Person)  Thought Content:Logical  Diagnosis of Schizophrenia or Schizoaffective disorder in past: No    Hallucinations:Hallucinations: None  Ideas of Reference:None  Suicidal Thoughts:Suicidal Thoughts: No  Homicidal Thoughts:Homicidal Thoughts: No   Sensorium  Memory: Recent Good; Immediate Good  Judgment: Good  Insight: Good   Executive Functions  Concentration:  Good  Attention Span: Good  Recall: Good  Fund of Knowledge: Good  Language: Good   Psychomotor Activity  Psychomotor Activity: Psychomotor Activity: Normal   Assets  Assets: Communication Skills; Desire for Improvement; Resilience   Sleep  Sleep: Sleep: Good   No data recorded  Physical Exam  Physical Exam Constitutional:      Appearance: the patient is not toxic-appearing.  Pulmonary:     Effort: Pulmonary effort is normal.  Neurological:     General: No focal deficit present.     Mental Status: the patient is alert and oriented to person, place, and time.   Review of Systems  Respiratory:  Negative for shortness of breath.   Cardiovascular:  Negative for chest pain.  Gastrointestinal:  Negative for abdominal pain, constipation, diarrhea, nausea and vomiting.  Neurological:  Negative for headaches.   Blood pressure 118/77, pulse 64, temperature 98.3 F (36.8 C), temperature source Tympanic, resp. rate 16, SpO2 100 %. There is no height or weight on file to calculate BMI.  Treatment Plan Summary: Patient continues to do well with good insight, judgement and motivation to go to rehab tomorrow. No medication  adjustments needed at this time.     #Cocaine Use Disorder #Substance-induced mood disorder #Prolonged Grief Disorder #Nicotine Use Disorder -Continue hydroxyzine 25 mg p.o. 3 times daily as needed -Continue trazodone 100 mg p.o. nightly as needed -Continue gabapentin 300 mg p.o. 3 times daily -Nicotine patch 14 mg daily PRN  #T2DM -Continue glipizide 5 mg p.o. daily -Continue insulin coverage Semglee 30 units q PM -Moderate SSI Last CBG: 144  #Hyperlipidemia -Continue Lipitor 10 mg daily   Dispo: [place] 7/2  Karie Fetch, MD, PGY-2 03/12/2023 12:27 PM

## 2023-03-12 NOTE — Progress Notes (Signed)
Pt is awake, alert and oriented X4. Pt did not voice any complaints of pain or discomfort. No signs of acute distress noted. Administered scheduled meds with no issue. Pt denies current SI/HI/AVH, plan or intent. Staff will monitor for pt's safety. 

## 2023-03-12 NOTE — Progress Notes (Signed)
Pt is watching TV in the dayroom. No distress noted or concerns voiced. Staff will monitor for pt's safety. ?

## 2023-03-13 DIAGNOSIS — Z5900 Homelessness unspecified: Secondary | ICD-10-CM | POA: Diagnosis not present

## 2023-03-13 DIAGNOSIS — R45851 Suicidal ideations: Secondary | ICD-10-CM | POA: Diagnosis not present

## 2023-03-13 DIAGNOSIS — Z560 Unemployment, unspecified: Secondary | ICD-10-CM | POA: Diagnosis not present

## 2023-03-13 DIAGNOSIS — F1410 Cocaine abuse, uncomplicated: Secondary | ICD-10-CM | POA: Diagnosis not present

## 2023-03-13 DIAGNOSIS — E119 Type 2 diabetes mellitus without complications: Secondary | ICD-10-CM | POA: Diagnosis not present

## 2023-03-13 LAB — GLUCOSE, CAPILLARY: Glucose-Capillary: 168 mg/dL — ABNORMAL HIGH (ref 70–99)

## 2023-03-13 NOTE — ED Notes (Signed)
Pt is currently sleeping, no distress noted, environmental check complete, will continue to monitor patient for safety.  

## 2023-04-05 ENCOUNTER — Ambulatory Visit (INDEPENDENT_AMBULATORY_CARE_PROVIDER_SITE_OTHER): Payer: Medicaid Other | Admitting: Podiatry

## 2023-04-05 DIAGNOSIS — Z91199 Patient's noncompliance with other medical treatment and regimen due to unspecified reason: Secondary | ICD-10-CM

## 2023-04-05 NOTE — Progress Notes (Signed)
No show for apt.

## 2023-04-13 ENCOUNTER — Telehealth: Payer: Self-pay | Admitting: Critical Care Medicine

## 2023-04-13 ENCOUNTER — Telehealth: Payer: Self-pay

## 2023-04-13 ENCOUNTER — Other Ambulatory Visit: Payer: Self-pay | Admitting: Pharmacist

## 2023-04-13 MED ORDER — NOVOLOG FLEXPEN 100 UNIT/ML ~~LOC~~ SOPN: PEN_INJECTOR | SUBCUTANEOUS | 0 refills | Status: DC

## 2023-04-13 MED ORDER — BASAGLAR KWIKPEN 100 UNIT/ML ~~LOC~~ SOPN: 30.0000 [IU] | PEN_INJECTOR | Freq: Every day | SUBCUTANEOUS | 1 refills | Status: DC

## 2023-04-13 NOTE — Telephone Encounter (Signed)
Medication Refill - Medication: insulin aspart (NOVOLOG) 100 UNIT/ML injection  Has the patient contacted their pharmacy? No. Pt is at a sober living house in [place].  [address] [place], [place] [address] Pt states that he was originally taking Humalog two injections 30 units a day and then was changed to Novolog when he went to behavioral health. Pt states that he also use to take 20 units at night of Vasguard.   Pt would like a call back to discuss.     Preferred Pharmacy (with phone number or street name):  [id number] [place], [place] - [address] AT [facility] [address] Phone: [phone]  Fax: [phone]     Has the patient been seen for an appointment in the last year OR does the patient have an upcoming appointment? Yes.    Agent: Please be advised that RX refills may take up to 3 business days. We ask that you follow-up with your pharmacy.

## 2023-04-13 NOTE — Telephone Encounter (Signed)
Patient would like clarification on how to take insulin and requesting Vasguard, which is not on current list. Please advise patient.

## 2023-04-13 NOTE — Telephone Encounter (Signed)
Copied from CRM [id number]. Topic: General - Other >> Apr 13, 2023 11:10 AM Dondra Prader A wrote: Reason for CRM: Pt states that he is living in a sober living house in [place] and will be there for the next 6 months. Pt is wanting to keep his PCP but wants to know if his PCP has any recommendations on who he should see while he is at the sober living house, or want to know if he need to find a new PCP. Please advise and call pt back.  Pt phone number: [phone] Pt is living at: [address] [place], [place] [address]

## 2023-04-13 NOTE — Telephone Encounter (Signed)
Medication being referred to here is Hospital doctor, not "Vasguard". There is no such thing as Vasguard. I have refilled the Basaglar and Novolog. Rxn sent to requested pharmacy.

## 2023-04-13 NOTE — Telephone Encounter (Signed)
Thank you Luke :)

## 2023-04-17 NOTE — Telephone Encounter (Signed)
Called patient and made his an appointment for September. He is aware that you are retiring.  He also had a question if know any good  Doctors in [place], [place]

## 2023-04-18 ENCOUNTER — Other Ambulatory Visit: Payer: Self-pay

## 2023-04-18 ENCOUNTER — Other Ambulatory Visit (HOSPITAL_COMMUNITY): Payer: Self-pay

## 2023-04-18 ENCOUNTER — Telehealth: Payer: MEDICAID | Admitting: Critical Care Medicine

## 2023-04-18 MED ORDER — BASAGLAR KWIKPEN 100 UNIT/ML ~~LOC~~ SOPN
30.0000 [IU] | PEN_INJECTOR | Freq: Every day | SUBCUTANEOUS | 1 refills | Status: AC
  Filled 2023-04-18 – 2023-04-25 (×2): qty 15, 50d supply, fill #0

## 2023-04-18 MED ORDER — NOVOLOG FLEXPEN 100 UNIT/ML ~~LOC~~ SOPN
0.0000 [IU] | PEN_INJECTOR | Freq: Three times a day (TID) | SUBCUTANEOUS | 0 refills | Status: DC
  Filled 2023-04-18 – 2023-04-25 (×2): qty 15, 33d supply, fill #0

## 2023-04-18 NOTE — Telephone Encounter (Signed)
See other phone note

## 2023-04-18 NOTE — Telephone Encounter (Signed)
This patient is in a sober living home in [place] [address] [place] [address]  Can you research to see if he has insurance under Medicaid I do see [employer] listed I do not know if it is for mental health only  He is a former homeless patient at [facility]  I am primary care for him  I gave him a list of primary care sites in [place] he could potentially connect with can you determine which sites there take Medicaid  I am going to write a prescription for his insulin under the right phone and asked them to mail these to his address in [place]

## 2023-04-19 NOTE — Telephone Encounter (Signed)
I sent tx yesterday to his [place] PHARMACY

## 2023-04-20 NOTE — Telephone Encounter (Signed)
I called the patient and informed him about the insulin orders.  I explained that Dr Delford Field sent the orders to [facility] and  requested that the pharmacy mail to him in [place].  I confirmed his current mailing address and told to call me if he has not received it at the beginning of the week.

## 2023-04-20 NOTE — Telephone Encounter (Signed)
The patient also told me that he called [employer] about the relocation and was told to call DSS in [place] which he did and they told him to call DSS in [facility]  He said he called [facility]  and is waiting for a call back.

## 2023-04-25 ENCOUNTER — Other Ambulatory Visit (HOSPITAL_COMMUNITY): Payer: Self-pay

## 2023-04-25 ENCOUNTER — Other Ambulatory Visit: Payer: Self-pay

## 2023-06-06 ENCOUNTER — Ambulatory Visit: Payer: MEDICAID | Admitting: Critical Care Medicine

## 2023-06-06 NOTE — Progress Notes (Deleted)
New Patient Office Visit  Subjective    Patient ID: Dakota Kim, male    DOB: [birth year]  Age: 52 y.o. MRN: [id number]  CC:  No chief complaint on file.   HPI 11/30/22 Dakota Kim presents to establish care Patient comes from the [facility].  He has been homeless for several years.  Diagnosed with diabetes 3 years ago.  He is having foot pain as well.  Also hyperlipidemia.  On arrival blood pressure is good 123/82.  Patient smokes some cigarettes daily.  Does not drink any alcohol.  Does not use any other substances. Patient's blood sugars run in the 120s to 130s.  However his A1c is 9.0. Patient maintains insulin lispro 30 units twice a day with meals hold if holding the meal.  Patient on insulin glargine as well.  Patient on glipizide along with this.  Patient has a infection in his low left great toe  06/07/23  Outpatient Encounter Medications as of 06/06/2023  Medication Sig   atorvastatin (LIPITOR) 10 MG tablet Take 1 tablet (10 mg total) by mouth daily.   gabapentin (NEURONTIN) 300 MG capsule Take 1 capsule (300 mg total) by mouth 2 (two) times daily.   glipiZIDE (GLUCOTROL) 5 MG tablet Take 1 tablet (5 mg total) by mouth daily before breakfast.   insulin aspart (NOVOLOG FLEXPEN) 100 UNIT/ML FlexPen Inject 0-15 Units into the skin 3 (three) times daily with meals.   Insulin Glargine (BASAGLAR KWIKPEN) 100 UNIT/ML Inject 30 Units into the skin at bedtime.   nicotine (NICODERM CQ - DOSED IN MG/24 HOURS) 14 mg/24hr patch Place 1 patch (14 mg total) onto the skin daily as needed (NRT).   traZODone (DESYREL) 100 MG tablet Take 1 tablet (100 mg total) by mouth at bedtime as needed for sleep.   [DISCONTINUED] ferrous sulfate 325 (65 FE) MG tablet Take 1 tablet (325 mg total) by mouth daily. (Patient not taking: Reported on 09/23/2019)   [DISCONTINUED] Insulin Lispro Prot & Lispro (HUMALOG MIX 75/25 KWIKPEN) (75-25) 100 UNIT/ML Kwikpen Inject 45 Units into the skin  2 (two) times daily with breakfast and lunch.   No facility-administered encounter medications on file as of 06/06/2023.    Past Medical History:  Diagnosis Date   Diabetes mellitus without complication (HCC)    Vitamin D deficiency 09/2019    No past surgical history on file.  Family History  Problem Relation Age of Onset   Diabetes Mellitus II Father     Social History   Socioeconomic History   Marital status: Single    Spouse name: Not on file   Number of children: Not on file   Years of education: Not on file   Highest education level: Not on file  Occupational History   Not on file  Tobacco Use   Smoking status: Some Days    Current packs/day: 0.15    Average packs/day: 0.2 packs/day for 20.0 years (3.0 ttl pk-yrs)    Types: Cigarettes   Smokeless tobacco: Never  Vaping Use   Vaping status: Never Used  Substance and Sexual Activity   Alcohol use: Not Currently    Comment: occassionally   Drug use: No   Sexual activity: Not Currently  Other Topics Concern   Not on file  Social History Narrative   Not on file   Social Determinants of Health   Financial Resource Strain: Not on file  Food Insecurity: Food Insecurity Present (03/03/2023)   Hunger Vital Sign  Worried About Programme researcher, broadcasting/film/video in the Last Year: Often true    Barista in the Last Year: Often true  Transportation Needs: Unmet Transportation Needs (03/03/2023)   PRAPARE - Administrator, Civil Service (Medical): Yes    Lack of Transportation (Non-Medical): Yes  Physical Activity: Not on file  Stress: Not on file  Social Connections: Not on file  Intimate Partner Violence: Not At Risk (03/03/2023)   Humiliation, Afraid, Rape, and Kick questionnaire    Fear of Current or Ex-Partner: No    Emotionally Abused: No    Physically Abused: No    Sexually Abused: No    Review of Systems  Constitutional:  Negative for chills, diaphoresis, fever, malaise/fatigue and weight loss.   HENT:  Negative for congestion, hearing loss, nosebleeds, sore throat and tinnitus.   Eyes:  Negative for blurred vision, photophobia and redness.  Respiratory:  Negative for cough, hemoptysis, sputum production, shortness of breath, wheezing and stridor.   Cardiovascular:  Negative for chest pain, palpitations, orthopnea, claudication, leg swelling and PND.  Gastrointestinal:  Negative for abdominal pain, blood in stool, constipation, diarrhea, heartburn, nausea and vomiting.  Genitourinary:  Negative for dysuria, flank pain, frequency, hematuria and urgency.  Musculoskeletal:  Negative for back pain, falls, joint pain, myalgias and neck pain.       Right foot pain  Skin:  Negative for itching and rash.  Neurological:  Negative for dizziness, tingling, tremors, sensory change, speech change, focal weakness, seizures, loss of consciousness, weakness and headaches.  Endo/Heme/Allergies:  Negative for environmental allergies and polydipsia. Does not bruise/bleed easily.  Psychiatric/Behavioral:  Negative for depression, memory loss, substance abuse and suicidal ideas. The patient is not nervous/anxious and does not have insomnia.         Objective    There were no vitals taken for this visit.  Physical Exam      Assessment & Plan:   Problem List Items Addressed This Visit   None    No follow-ups on file.   Shan Levans, MD

## 2023-08-11 ENCOUNTER — Encounter (HOSPITAL_COMMUNITY): Payer: Self-pay | Admitting: Emergency Medicine

## 2023-08-11 ENCOUNTER — Other Ambulatory Visit: Payer: Self-pay

## 2023-08-11 ENCOUNTER — Ambulatory Visit (HOSPITAL_COMMUNITY)
Admission: EM | Admit: 2023-08-11 | Discharge: 2023-08-11 | Disposition: A | Discharge status: Home / Self Care | Payer: MEDICAID | Attending: Family Medicine | Admitting: Family Medicine

## 2023-08-11 DIAGNOSIS — Z760 Encounter for issue of repeat prescription: Secondary | ICD-10-CM | POA: Diagnosis not present

## 2023-08-11 MED ORDER — NOVOLOG FLEXPEN 100 UNIT/ML ~~LOC~~ SOPN: 0.0000 [IU] | PEN_INJECTOR | Freq: Three times a day (TID) | SUBCUTANEOUS | 0 refills | Status: AC

## 2023-08-11 NOTE — ED Triage Notes (Signed)
Patient takes insulin.  Patient is visiting family in area.  Insulin pen will not dispense. Patient has pen with him.  Patient is not returning home until monday

## 2023-08-13 NOTE — ED Provider Notes (Signed)
  [facility]   [id number] 08/11/23 Arrival Time: 1434  ASSESSMENT & PLAN:  1. Medication refill    Reports blood sugars are controlled. Refilled:  Meds ordered this encounter  Medications   insulin aspart (NOVOLOG FLEXPEN) 100 UNIT/ML FlexPen    Sig: Inject 0-15 Units into the skin 3 (three) times daily with meals.    Dispense:  15 mL    Refill:  0    Use congressional nurse phone and mail to [address] [place] [address]   May f/u as needed. Reviewed expectations re: course of current medical issues. Questions answered. Outlined signs and symptoms indicating need for more acute intervention. Patient verbalized understanding. After Visit Summary given.   SUBJECTIVE: History from: patient. Dakota Kim is a 52 y.o. male who presents requesting medication refill. No current concerns. Patient takes insulin.  Patient is visiting family in area.  Insulin pen will not dispense. Patient has pen with him.  Patient is not returning home until monday    OBJECTIVE:  Vitals:   08/11/23 1516  BP: 134/88  Pulse: 83  Resp: 18  Temp: 98.6 F (37 C)  TempSrc: Oral  SpO2: 96%    General appearance: alert; no distress Skin: warm and dry Psychological: alert and cooperative; normal mood and affect    No Known Allergies  Social History   Socioeconomic History   Marital status: Single    Spouse name: Not on file   Number of children: Not on file   Years of education: Not on file   Highest education level: Not on file  Occupational History   Not on file  Tobacco Use   Smoking status: Some Days    Current packs/day: 0.15    Average packs/day: 0.2 packs/day for 20.0 years (3.0 ttl pk-yrs)    Types: Cigarettes   Smokeless tobacco: Never  Vaping Use   Vaping status: Never Used  Substance and Sexual Activity   Alcohol use: Not Currently    Comment: occassionally   Drug use: No   Sexual activity: Not Currently  Other Topics Concern   Not on file  Social  History Narrative   Not on file   Social Determinants of Health   Financial Resource Strain: Not on file  Food Insecurity: Food Insecurity Present (03/03/2023)   Hunger Vital Sign    Worried About Running Out of Food in the Last Year: Often true    Ran Out of Food in the Last Year: Often true  Transportation Needs: Unmet Transportation Needs (03/03/2023)   PRAPARE - Administrator, Civil Service (Medical): Yes    Lack of Transportation (Non-Medical): Yes  Physical Activity: Not on file  Stress: Not on file  Social Connections: Not on file  Intimate Partner Violence: Not At Risk (03/03/2023)   Humiliation, Afraid, Rape, and Kick questionnaire    Fear of Current or Ex-Partner: No    Emotionally Abused: No    Physically Abused: No    Sexually Abused: No   Family History  Problem Relation Age of Onset   Diabetes Mellitus II Father    History reviewed. No pertinent surgical history.    Mardella Layman, MD 08/13/23 [phone]
